# Patient Record
Sex: Female | Born: 1985 | Race: White | Hispanic: No | Marital: Married | Smoking: Never smoker
Health system: Southern US, Community
[De-identification: ages and names within clinical notes are randomized; demographics above are authoritative.]

## PROBLEM LIST (undated history)

## (undated) DIAGNOSIS — T7840XA Allergy, unspecified, initial encounter: Secondary | ICD-10-CM

## (undated) DIAGNOSIS — R87619 Unspecified abnormal cytological findings in specimens from cervix uteri: Secondary | ICD-10-CM

## (undated) DIAGNOSIS — M1990 Unspecified osteoarthritis, unspecified site: Secondary | ICD-10-CM

## (undated) DIAGNOSIS — F32A Depression, unspecified: Secondary | ICD-10-CM

## (undated) DIAGNOSIS — F329 Major depressive disorder, single episode, unspecified: Secondary | ICD-10-CM

## (undated) DIAGNOSIS — Z8619 Personal history of other infectious and parasitic diseases: Secondary | ICD-10-CM

## (undated) HISTORY — DX: Unspecified abnormal cytological findings in specimens from cervix uteri: R87.619

## (undated) HISTORY — DX: Unspecified osteoarthritis, unspecified site: M19.90

## (undated) HISTORY — DX: Allergy, unspecified, initial encounter: T78.40XA

## (undated) HISTORY — PX: HIP SURGERY: SHX245

## (undated) HISTORY — DX: Personal history of other infectious and parasitic diseases: Z86.19

## (undated) HISTORY — DX: Major depressive disorder, single episode, unspecified: F32.9

## (undated) HISTORY — DX: Depression, unspecified: F32.A

---

## 2008-08-11 LAB — HM PAP SMEAR

## 2012-08-11 DIAGNOSIS — R87619 Unspecified abnormal cytological findings in specimens from cervix uteri: Secondary | ICD-10-CM

## 2012-08-11 HISTORY — PX: CRYOTHERAPY: SHX1416

## 2012-08-11 HISTORY — DX: Unspecified abnormal cytological findings in specimens from cervix uteri: R87.619

## 2012-11-26 ENCOUNTER — Ambulatory Visit (INDEPENDENT_AMBULATORY_CARE_PROVIDER_SITE_OTHER): Payer: BC Managed Care – PPO | Admitting: Internal Medicine

## 2012-11-26 ENCOUNTER — Encounter: Payer: Self-pay | Admitting: Internal Medicine

## 2012-11-26 ENCOUNTER — Other Ambulatory Visit (INDEPENDENT_AMBULATORY_CARE_PROVIDER_SITE_OTHER): Payer: BC Managed Care – PPO

## 2012-11-26 VITALS — BP 110/68 | HR 84 | Temp 97.9°F | Ht 70.5 in | Wt 181.0 lb

## 2012-11-26 DIAGNOSIS — F3289 Other specified depressive episodes: Secondary | ICD-10-CM

## 2012-11-26 DIAGNOSIS — Z131 Encounter for screening for diabetes mellitus: Secondary | ICD-10-CM

## 2012-11-26 DIAGNOSIS — Z1329 Encounter for screening for other suspected endocrine disorder: Secondary | ICD-10-CM

## 2012-11-26 DIAGNOSIS — F329 Major depressive disorder, single episode, unspecified: Secondary | ICD-10-CM

## 2012-11-26 DIAGNOSIS — Z5189 Encounter for other specified aftercare: Secondary | ICD-10-CM

## 2012-11-26 DIAGNOSIS — S8991XD Unspecified injury of right lower leg, subsequent encounter: Secondary | ICD-10-CM

## 2012-11-26 DIAGNOSIS — Z130 Encounter for screening for diseases of the blood and blood-forming organs and certain disorders involving the immune mechanism: Secondary | ICD-10-CM

## 2012-11-26 DIAGNOSIS — Z0000 Encounter for general adult medical examination without abnormal findings: Secondary | ICD-10-CM

## 2012-11-26 DIAGNOSIS — Z13220 Encounter for screening for lipoid disorders: Secondary | ICD-10-CM

## 2012-11-26 DIAGNOSIS — F32A Depression, unspecified: Secondary | ICD-10-CM

## 2012-11-26 LAB — LIPID PANEL
Cholesterol: 147 mg/dL (ref 0–200)
HDL: 50.6 mg/dL (ref >39.00)
LDL Cholesterol: 84 mg/dL (ref 0–99)
Triglycerides: 64 mg/dL (ref 0.0–149.0)
VLDL: 12.8 mg/dL (ref 0.0–40.0)

## 2012-11-26 LAB — CBC
MCHC: 33.3 g/dL (ref 30.0–36.0)
MCV: 93.3 fl (ref 78.0–100.0)
Platelets: 253 10*3/uL (ref 150.0–400.0)
RDW: 12.8 % (ref 11.5–14.6)

## 2012-11-26 LAB — BASIC METABOLIC PANEL
CO2: 30 mEq/L (ref 19–32)
Chloride: 103 mEq/L (ref 96–112)
Creatinine, Ser: 0.7 mg/dL (ref 0.4–1.2)
Potassium: 3.7 mEq/L (ref 3.5–5.1)
Sodium: 139 mEq/L (ref 135–145)

## 2012-11-26 MED ORDER — FLUOXETINE HCL 20 MG PO CAPS: 20.0000 mg | ORAL_CAPSULE | Freq: Every day | ORAL | Status: DC

## 2012-11-26 NOTE — Progress Notes (Signed)
HPI  Pt presents to the clinic today to establish care. She has not seen a PCP in a number of years. She does have a few concerns today. 1- She is having issues with depression. She has bouts of remission. This has been an issue since she was a teenager. She has never been on meds for this but she is having trouble getting over this episode and would like to start medication. She is also looking into counseling.2-. 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 She is having trouble with her right knee currently. She would like referral to sports medicine for further evaluation.   Flu: never Tetanus: 2010 LMP: 11/12/12  Past Medical History  Diagnosis Date  . Arthritis   . Depression   . Allergy   . History of chicken pox     Current Outpatient Prescriptions  Medication Sig Dispense Refill  . glucosamine-chondroitin 500-400 MG tablet Take 1 tablet by mouth daily.      . Multiple Vitamins-Minerals (WOMENS MULTIVITAMIN PLUS) TABS Take 1 tablet by mouth daily.       No current facility-administered medications for this visit.    No Known Allergies  Family History  Problem Relation Age of Onset  . Mitral valve prolapse Father   . Mitral valve prolapse Maternal Aunt   . Hypertension Paternal Grandfather   . Mitral valve prolapse Maternal Aunt   . Breast cancer Other     strong famiy history on both sides  . Stroke Neg Hx     History   Social History  . Marital Status: Single    Spouse Name: N/A    Number of Children: N/A  . Years of Education: 16+   Occupational History  . Server/Freelance Dancer    Social History Main Topics  . Smoking status: Never Smoker   . Smokeless tobacco: Never Used  . Alcohol Use: Yes  . Drug Use: No  . Sexually Active: Yes    Birth Control/ Protection: Condom   Other Topics Concern  . Not on file   Social History Narrative   Caffeine Use-yes   Regular exercise-yes    ROS:  Constitutional: Denies fever, malaise,  fatigue, headache or abrupt weight changes.  HEENT: Denies eye pain, eye redness, ear pain, ringing in the ears, wax buildup, runny nose, nasal congestion, bloody nose, or sore throat. Respiratory: Denies difficulty breathing, shortness of breath, cough or sputum production.   Cardiovascular: Denies chest pain, chest tightness, palpitations or swelling in the hands or feet.  Gastrointestinal: Denies abdominal pain, bloating, constipation, diarrhea or blood in the stool.  GU: Denies frequency, urgency, pain with urination, blood in urine, odor or discharge. Musculoskeletal: Pt reports right knee pain. Denies decrease in range of motion, difficulty with gait, muscle pain or joint swelling.  Skin: Denies redness, rashes, lesions or ulcercations.  Neurological: Denies dizziness, difficulty with memory, difficulty with speech or problems with balance and coordination.  Psych: Pt reports depression. Denies anxiety, SI/HI.  No other specific complaints in a complete review of systems (except as listed in HPI above).  PE:  BP 110/68  Pulse 84  Temp(Src) 97.9 F (36.6 C) (Oral)  Ht 5' 10.5" (1.791 m)  Wt 181 lb (82.101 kg)  BMI 25.6 kg/m2  SpO2 98%  LMP 11/12/2012 Wt Readings from Last 3 Encounters:  11/26/12 181 lb (82.101 kg)    General: Appears ther stated age, overweight but well developed, well nourished in NAD. HEENT: Head: normal  shape and size; Eyes: sclera white, no icterus, conjunctiva pink, PERRLA and EOMs intact; Ears: Tm's gray and intact, normal light reflex; Nose: mucosa pink and moist, septum midline; Throat/Mouth: Teeth present, mucosa pink and moist, no lesions or ulcerations noted.  Neck: Normal range of motion. Neck supple, trachea midline. No massses, lumps or thyromegaly present.  Cardiovascular: Normal rate and rhythm. S1,S2 noted.  No murmur, rubs or gallops noted. No JVD or BLE edema. No carotid bruits noted. Pulmonary/Chest: Normal effort and positive vesicular breath  sounds. No respiratory distress. No wheezes, rales or ronchi noted.  Abdomen: Soft and nontender. Normal bowel sounds, no bruits noted. No distention or masses noted. Liver, spleen and kidneys non palpable. Musculoskeletal: Normal range of motion. No signs of joint swelling. No difficulty with gait.  Neurological: Alert and oriented. Cranial nerves II-XII intact. Coordination normal. +DTRs bilaterally. Psychiatric: Mood depressed and affect flat. Behavior is normal. Judgment and thought content normal.      Assessment and Plan:  Preventative health Maintenance:  Pt will schedule pap smear with me Will obtain basic screening labs today  Right knee injury:  Will refer to sports medicine Information about [facility] care given

## 2012-11-26 NOTE — Patient Instructions (Signed)
Health Maintenance, Females A healthy lifestyle and preventative care can promote health and wellness.  Maintain regular health, dental, and eye exams.  Eat a healthy diet. Foods like vegetables, fruits, whole grains, low-fat dairy products, and lean protein foods contain the nutrients you need without too many calories. Decrease your intake of foods high in solid fats, added sugars, and salt. Get information about a proper diet from your caregiver, if necessary.  Regular physical exercise is one of the most important things you can do for your health. Most adults should get at least 150 minutes of moderate-intensity exercise (any activity that increases your heart rate and causes you to sweat) each week. In addition, most adults need muscle-strengthening exercises on 2 or more days a week.   Maintain a healthy weight. The body mass index (BMI) is a screening tool to identify possible weight problems. It provides an estimate of body fat based on height and weight. Your caregiver can help determine your BMI, and can help you achieve or maintain a healthy weight. For adults 20 years and older:  A BMI below 18.5 is considered underweight.  A BMI of 18.5 to 24.9 is normal.  A BMI of 25 to 29.9 is considered overweight.  A BMI of 30 and above is considered obese.  Maintain normal blood lipids and cholesterol by exercising and minimizing your intake of saturated fat. Eat a balanced diet with plenty of fruits and vegetables. Blood tests for lipids and cholesterol should begin at age 20 and be repeated every 5 years. If your lipid or cholesterol levels are high, you are over 50, or you are a high risk for heart disease, you may need your cholesterol levels checked more frequently.Ongoing high lipid and cholesterol levels should be treated with medicines if diet and exercise are not effective.  If you smoke, find out from your caregiver how to quit. If you do not use tobacco, do not start.  If you  are pregnant, do not drink alcohol. If you are breastfeeding, be very cautious about drinking alcohol. If you are not pregnant and choose to drink alcohol, do not exceed 1 drink per day. One drink is considered to be 12 ounces (355 mL) of beer, 5 ounces (148 mL) of wine, or 1.5 ounces (44 mL) of liquor.  Avoid use of street drugs. Do not share needles with anyone. Ask for help if you need support or instructions about stopping the use of drugs.  High blood pressure causes heart disease and increases the risk of stroke. Blood pressure should be checked at least every 1 to 2 years. Ongoing high blood pressure should be treated with medicines, if weight loss and exercise are not effective.  If you are 55 to 28 years old, ask your caregiver if you should take aspirin to prevent strokes.  Diabetes screening involves taking a blood sample to check your fasting blood sugar level. This should be done once every 3 years, after age 45, if you are within normal weight and without risk factors for diabetes. Testing should be considered at a younger age or be carried out more frequently if you are overweight and have at least 1 risk factor for diabetes.  Breast cancer screening is essential preventative care for women. You should practice "breast self-awareness." This means understanding the normal appearance and feel of your breasts and may include breast self-examination. Any changes detected, no matter how small, should be reported to a caregiver. Women in their 20s and 30s should have   a clinical breast exam (CBE) by a caregiver as part of a regular health exam every 1 to 3 years. After age 40, women should have a CBE every year. Starting at age 40, women should consider having a mammogram (breast X-ray) every year. Women who have a family history of breast cancer should talk to their caregiver about genetic screening. Women at a high risk of breast cancer should talk to their caregiver about having an MRI and a  mammogram every year.  The Pap test is a screening test for cervical cancer. Women should have a Pap test starting at age 21. Between ages 21 and 29, Pap tests should be repeated every 2 years. Beginning at age 30, you should have a Pap test every 3 years as long as the past 3 Pap tests have been normal. If you had a hysterectomy for a problem that was not cancer or a condition that could lead to cancer, then you no longer need Pap tests. If you are between ages 65 and 70, and you have had normal Pap tests going back 10 years, you no longer need Pap tests. If you have had past treatment for cervical cancer or a condition that could lead to cancer, you need Pap tests and screening for cancer for at least 20 years after your treatment. If Pap tests have been discontinued, risk factors (such as a new sexual partner) need to be reassessed to determine if screening should be resumed. Some women have medical problems that increase the chance of getting cervical cancer. In these cases, your caregiver may recommend more frequent screening and Pap tests.  The human papillomavirus (HPV) test is an additional test that may be used for cervical cancer screening. The HPV test looks for the virus that can cause the cell changes on the cervix. The cells collected during the Pap test can be tested for HPV. The HPV test could be used to screen women aged 30 years and older, and should be used in women of any age who have unclear Pap test results. After the age of 30, women should have HPV testing at the same frequency as a Pap test.  Colorectal cancer can be detected and often prevented. Most routine colorectal cancer screening begins at the age of 50 and continues through age 75. However, your caregiver may recommend screening at an earlier age if you have risk factors for colon cancer. On a yearly basis, your caregiver may provide home test kits to check for hidden blood in the stool. Use of a small camera at the end of a  tube, to directly examine the colon (sigmoidoscopy or colonoscopy), can detect the earliest forms of colorectal cancer. Talk to your caregiver about this at age 50, when routine screening begins. Direct examination of the colon should be repeated every 5 to 10 years through age 75, unless early forms of pre-cancerous polyps or small growths are found.  Hepatitis C blood testing is recommended for all people born from 1945 through 1965 and any individual with known risks for hepatitis C.  Practice safe sex. Use condoms and avoid high-risk sexual practices to reduce the spread of sexually transmitted infections (STIs). Sexually active women aged 25 and younger should be checked for Chlamydia, which is a common sexually transmitted infection. Older women with new or multiple partners should also be tested for Chlamydia. Testing for other STIs is recommended if you are sexually active and at increased risk.  Osteoporosis is a disease in which the   bones lose minerals and strength with aging. This can result in serious bone fractures. The risk of osteoporosis can be identified using a bone density scan. Women ages 65 and over and women at risk for fractures or osteoporosis should discuss screening with their caregivers. Ask your caregiver whether you should be taking a calcium supplement or vitamin D to reduce the rate of osteoporosis.  Menopause can be associated with physical symptoms and risks. Hormone replacement therapy is available to decrease symptoms and risks. You should talk to your caregiver about whether hormone replacement therapy is right for you.  Use sunscreen with a sun protection factor (SPF) of 30 or greater. Apply sunscreen liberally and repeatedly throughout the day. You should seek shade when your shadow is shorter than you. Protect yourself by wearing long sleeves, pants, a wide-brimmed hat, and sunglasses year round, whenever you are outdoors.  Notify your caregiver of new moles or  changes in moles, especially if there is a change in shape or color. Also notify your caregiver if a mole is larger than the size of a pencil eraser.  Stay current with your immunizations. Document Released: 02/10/2011 Document Revised: 10/20/2011 Document Reviewed: 02/10/2011 Exit[employer] Patient Information 2013 [employer]. Breast Self-Exam A self breast exam may help you find changes or problems while they are still small. Do a breast self-exam:  Every month.  One week after your period (menstrual period).  On the first day of each month if you do not have periods anymore. Look for any:  Change in breast color, size, or shape.  Dimples in your breast.  Changes in your nipples or skin.  Dry skin on your breasts or nipples.  Watery or bloody discharge from your nipples.  Feel for:  Lumps.  Thick, hard places.  Any other changes. HOME CARE There are 3 ways to do the breast self-exam: In front of a mirror.  Lift your arms over your head and turn side to side.  Put your hands on your hips and lean down, then turn from side to side.  Bend forward and turn from side to side. In the shower.  With soapy hands, check both breasts. Then check above and below your collarbone and your armpits.  Feel above and below your collarbone down to under your breast, and from the center of your chest to the outer edge of the armpit. Check for any lumps or hard spots.  Using the tips of your middle three fingers check your whole breast by pressing your hand over your breast in a circle or in an up and down motion. Lying down.  Lie flat on your bed.  Put a small pillow under the breast you are going to check. On that same side, put your hand behind your head.  With your other hand, use the 3 middle fingers to feel the breast.  Move your fingers in a circle around the breast. Press firmly over all parts of the breast to feel for any lumps. GET HELP RIGHT AWAY IF: You find any  changes in your breasts so they can be checked. Document Released: 01/14/2008 Document Revised: 10/20/2011 Document Reviewed: 11/15/2008 Exit[employer] Patient Information 2013 [employer].  

## 2012-11-26 NOTE — Assessment & Plan Note (Signed)
Reassurance given Will start prozac today Encouraged pt to contact counselor for CBT

## 2012-11-30 ENCOUNTER — Encounter: Payer: Self-pay | Admitting: Internal Medicine

## 2012-11-30 ENCOUNTER — Other Ambulatory Visit (HOSPITAL_COMMUNITY)
Admission: RE | Admit: 2012-11-30 | Discharge: 2012-11-30 | Disposition: A | Discharge status: Home / Self Care | Payer: BC Managed Care – PPO | Source: Ambulatory Visit | Attending: Internal Medicine | Admitting: Internal Medicine

## 2012-11-30 ENCOUNTER — Ambulatory Visit (INDEPENDENT_AMBULATORY_CARE_PROVIDER_SITE_OTHER): Payer: BC Managed Care – PPO | Admitting: Internal Medicine

## 2012-11-30 DIAGNOSIS — R87810 Cervical high risk human papillomavirus (HPV) DNA test positive: Secondary | ICD-10-CM | POA: Insufficient documentation

## 2012-11-30 DIAGNOSIS — Z124 Encounter for screening for malignant neoplasm of cervix: Secondary | ICD-10-CM

## 2012-11-30 DIAGNOSIS — Z01419 Encounter for gynecological examination (general) (routine) without abnormal findings: Secondary | ICD-10-CM

## 2012-11-30 DIAGNOSIS — Z1151 Encounter for screening for human papillomavirus (HPV): Secondary | ICD-10-CM | POA: Insufficient documentation

## 2012-11-30 NOTE — Patient Instructions (Signed)

## 2012-11-30 NOTE — Progress Notes (Signed)
  Subjective:    Patient ID: Robyn Long, female    DOB: 1985/08/16, 27 y.o.   MRN: [id number]  HPI  Pt presents to the clinic today for her pap smear. She has no concerns.  Review of Systems      Past Medical History  Diagnosis Date  . Arthritis   . Depression   . Allergy   . History of chicken pox     Current Outpatient Prescriptions  Medication Sig Dispense Refill  . FLUoxetine (PROZAC) 20 MG capsule Take 1 capsule (20 mg total) by mouth daily.  30 capsule  3  . glucosamine-chondroitin 500-400 MG tablet Take 1 tablet by mouth daily.      . Multiple Vitamins-Minerals (WOMENS MULTIVITAMIN PLUS) TABS Take 1 tablet by mouth daily.       No current facility-administered medications for this visit.    No Known Allergies  Family History  Problem Relation Age of Onset  . Mitral valve prolapse Father   . Mitral valve prolapse Maternal Aunt   . Hypertension Paternal Grandfather   . Mitral valve prolapse Maternal Aunt   . Breast cancer Other     strong famiy history on both sides  . Stroke Neg Hx     History   Social History  . Marital Status: Single    Spouse Name: N/A    Number of Children: N/A  . Years of Education: 16+   Occupational History  . Server/Freelance Dancer    Social History Main Topics  . Smoking status: Never Smoker   . Smokeless tobacco: Never Used  . Alcohol Use: Yes  . Drug Use: No  . Sexually Active: Yes    Birth Control/ Protection: Condom   Other Topics Concern  . Not on file   Social History Narrative   Caffeine Use-yes   Regular exercise-yes     Constitutional: Denies fever, malaise, fatigue, headache or abrupt weight changes.  GU: Denies urgency, frequency, pain with urination, burning sensation, blood in urine, odor or discharge. Skin: Denies redness, rashes, lesions or ulcercations.    No other specific complaints in a complete review of systems (except as listed in HPI above).  Objective:   Physical  Exam  Constitutional:  Alert, oriented x 4, well developed, well nourished in no apparent distress. Skin: Skin is warm and dry.  No erythema, lesion or ulceration noted. Small 5 mm nevus noted on right breast. Cardiovascular: Normal rate and rhythm. S1,S2 noted.  No murmur, rubs or gallops noted. No JVD or BLE edema. No carotid bruits noted. Pulmonary/Chest: Normal effort and positive vesicular breath sounds. No respiratory distress. No wheezes, rales or ronchi noted.  Genitourinary: Normal female anatomy. Uterus midline, anterior and soft. No CMT or discharge noted. Adenexa  Palpable bilaterally. Breast without lumps or masses.         Assessment & Plan:   Screening for cervical cancer:  Pap performed today including breast and bimanual exam Will send off tissue for review  RTC in 1 year or sooner if needed

## 2012-12-06 ENCOUNTER — Telehealth: Payer: Self-pay

## 2012-12-06 ENCOUNTER — Other Ambulatory Visit: Payer: Self-pay | Admitting: Internal Medicine

## 2012-12-06 DIAGNOSIS — IMO0002 Reserved for concepts with insufficient information to code with codable children: Secondary | ICD-10-CM

## 2012-12-06 NOTE — Telephone Encounter (Signed)
Lm for pt to call back

## 2012-12-06 NOTE — Telephone Encounter (Signed)
Message copied by Noreene Larsson on Mon Dec 06, 2012 10:53 AM ------      Message from: Lorre Munroe      Created: Mon Dec 06, 2012  8:08 AM       Robyn Long,      Can you please call Robyn Long and let her know that her pap smear came back with abnormal cells and she was HPV positive. This is something that needs further evaluation by a gynecologist. I have put in a referral for her to have a colposcopy by gyn for further evaluation. They will call her with the appointment. If she has any questions, please let me know.      [name] ------

## 2012-12-06 NOTE — Telephone Encounter (Signed)
Message copied by Noreene Larsson on Mon Dec 06, 2012  5:14 PM ------      Message from: Lorre Munroe      Created: Mon Dec 06, 2012  8:08 AM       Georgiann Hahn,      Can you please call Mrs. Bettendorf and let her know that her pap smear came back with abnormal cells and she was HPV positive. This is something that needs further evaluation by a gynecologist. I have put in a referral for her to have a colposcopy by gyn for further evaluation. They will call her with the appointment. If she has any questions, please let me know.      [name] ------

## 2012-12-06 NOTE — Telephone Encounter (Signed)
Pt notifed.

## 2012-12-15 ENCOUNTER — Ambulatory Visit (INDEPENDENT_AMBULATORY_CARE_PROVIDER_SITE_OTHER): Payer: BC Managed Care – PPO | Admitting: Sports Medicine

## 2012-12-15 ENCOUNTER — Ambulatory Visit
Admission: RE | Admit: 2012-12-15 | Discharge: 2012-12-15 | Disposition: A | Discharge status: Home / Self Care | Payer: BC Managed Care – PPO | Source: Ambulatory Visit | Attending: Sports Medicine | Admitting: Sports Medicine

## 2012-12-15 VITALS — BP 100/70 | Ht 70.0 in | Wt 175.0 lb

## 2012-12-15 DIAGNOSIS — M25561 Pain in right knee: Secondary | ICD-10-CM

## 2012-12-15 DIAGNOSIS — M25569 Pain in unspecified knee: Secondary | ICD-10-CM

## 2012-12-15 NOTE — Progress Notes (Signed)
  Subjective:    Patient ID: Robyn Long, female    DOB: 1985-11-29, 26 y.o.   MRN: [id number]  HPI chief complaint: Right knee pain  Very pleasant 27 year old female comes in today complaining of 1 month of medial sided right knee pain. No specific injury that she can recall. 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 She describes an aching discomfort which at times were radiate up into the medial thigh. She has not noticed any swelling. He denies locking or catching of the knee. 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 She's been treated with patellar stabilizer brace is and physical therapy. She has not had any problems with the right knee in the past. No prior knee surgeries. No pain at rest. No pain in the groin.  Medical history and current medications are reviewed. No known drug allergies She does not smoke, drinks alcohol on occasional basis    Review of Systems     Objective:   Physical Exam Well-developed, well-nourished. No acute distress. Awake alert and oriented x3. Vital signs are reviewed  Right knee: Full range of motion. 3+ patellofemoral crepitus with significant tethering of the patella laterally. Positive patellar apprehension test. No effusion. There is slight tenderness to palpation over the medial femoral condyle but no significant tenderness over the medial joint line. Negative McMurray's. Mildly positive Thessaly's. Knee is stable to valgus and varus stressing. Negative anterior drawer, negative Lachmans. Negative posterior drawer. She has genu valgus bilaterally. VMO weakness bilaterally. Neurovascularly intact distally. Walking without significant limp.      Assessment & Plan:  1. Right knee pain-rule out OCD  X-rays of the right knee to include AP, lateral, tunnel view, and sunrise. She has an obvious history of  patellofemoral problems but I do not believe that is related to her current complaint. I will call her after I reviewed those x-rays. I recommended that she try some over-the-counter Aspercreme and I may have her work with Ellamae Sia for one to 2 visits. I do not want to give her a body helix compression sleeve as I am afraid that it will aggravate her patellofemoral symptoms. She will followup with me in 3 weeks.

## 2012-12-16 ENCOUNTER — Telehealth: Payer: Self-pay | Admitting: Sports Medicine

## 2012-12-16 NOTE — Telephone Encounter (Signed)
I discussed the patient's x-rays with her yesterday. She actually came back in to the office today to discuss them further with me. She has some mild degenerative changes in her knee. I would like to start with a comprehensive home exercise program with emphasis on quad and hamstring strengthening, particularly on strengthening the VMO muscle which is weak and atrophied in this patient. She will followup with me as scheduled in 3 weeks. We did discuss the possibility of formal physical therapy if she does not see some initial improvement with her home exercise program. I also explained to her that this will be a long-term process. She understands.

## 2013-01-05 ENCOUNTER — Telehealth: Payer: Self-pay | Admitting: General Practice

## 2013-01-05 NOTE — Telephone Encounter (Signed)
Patient called and stated she has an appt tomorrow at 1:45 and was referred to [place] because of her abnormal pap smear and her period is still lingering right now and she doesn't know if she needs to reschedule or not and would like a call back. Called patient no answer- left message stating if it was still heavy that she needs to reschedule but if its lighter she can still come in and be seen and to call us back if she has any other questions or concerns

## 2013-01-06 ENCOUNTER — Encounter: Payer: Self-pay | Admitting: Obstetrics & Gynecology

## 2013-01-06 ENCOUNTER — Ambulatory Visit (INDEPENDENT_AMBULATORY_CARE_PROVIDER_SITE_OTHER): Payer: BC Managed Care – PPO | Admitting: Obstetrics & Gynecology

## 2013-01-06 ENCOUNTER — Other Ambulatory Visit (HOSPITAL_COMMUNITY)
Admission: RE | Admit: 2013-01-06 | Discharge: 2013-01-06 | Disposition: A | Discharge status: Home / Self Care | Payer: BC Managed Care – PPO | Source: Ambulatory Visit | Attending: Obstetrics & Gynecology | Admitting: Obstetrics & Gynecology

## 2013-01-06 ENCOUNTER — Ambulatory Visit (INDEPENDENT_AMBULATORY_CARE_PROVIDER_SITE_OTHER): Payer: BC Managed Care – PPO | Admitting: Sports Medicine

## 2013-01-06 VITALS — BP 112/73 | HR 92 | Temp 99.5°F | Ht 70.0 in | Wt 173.8 lb

## 2013-01-06 VITALS — BP 103/70 | Ht 70.0 in | Wt 175.0 lb

## 2013-01-06 DIAGNOSIS — N871 Moderate cervical dysplasia: Secondary | ICD-10-CM | POA: Insufficient documentation

## 2013-01-06 DIAGNOSIS — M25561 Pain in right knee: Secondary | ICD-10-CM

## 2013-01-06 DIAGNOSIS — R87610 Atypical squamous cells of undetermined significance on cytologic smear of cervix (ASC-US): Secondary | ICD-10-CM

## 2013-01-06 DIAGNOSIS — M25569 Pain in unspecified knee: Secondary | ICD-10-CM

## 2013-01-06 DIAGNOSIS — Z01812 Encounter for preprocedural laboratory examination: Secondary | ICD-10-CM

## 2013-01-06 DIAGNOSIS — R87810 Cervical high risk human papillomavirus (HPV) DNA test positive: Secondary | ICD-10-CM

## 2013-01-06 NOTE — Progress Notes (Signed)
Patient ID: Robyn Long, female   DOB: [birth year], 27 y.o.   MRN: [id number] Patient given informed consent, signed copy in the chart, time out was performed.  Placed in lithotomy position. Cervix viewed with speculum and colposcope after application of acetic acid.  11/30/2012 Diagnosis ATYPICAL SQUAMOUS CELLS OF UNDETERMINED SIGNIFICANCE (ASC-US). +HR HPV Colposcopy adequate?  yes Acetowhite lesions?yes Punctation?no Mosaicism?  no Abnormal vasculature?  no Biopsies?yes ECC?no Changes consistent with HPV Patient was given post procedure instructions.  We will do a phone follow up if her results only require 1 year f/u   Charrie Mcconnon L. Harraway-Smith, M.D., [facility]

## 2013-01-06 NOTE — Patient Instructions (Signed)
Colposcopy Colposcopy is a procedure that uses a special lighted microscope (colposcope). It examines your cervix and vagina, or the area around the outside of the vagina, for signs of disease or abnormalities in the cells. You may be sent to a specialist (gynecologist) to do the colposcopy. A biopsy (tissue sample) may be collected during a colposcopy, if the caregiver finds any unusual cells. The biopsy is sent to the lab for further testing, and the results are reported back to your caregiver. A WOMAN MAY NEED THIS PROCEDURE IF:  She has had an abnormal pap smear (taking cells from the cervix for testing).  She has a sore on her cervix, and a Pap test was normal.  The Pap test suggests human papilloma virus (HPV). This virus can cause genital warts and is linked to the development of cervical cancer.  She has genital warts on the cervix, or in or around the outside of the vagina.  Her mother took the drug DES while pregnant.  She has painful intercourse.  She has vaginal bleeding, especially after sexual intercourse.  There is a need to evaluate the results of previous treatment. BEFORE THE PROCEDURE   Colposcopy is done when you are not having a menstrual period.  For 24 hours before the colposcopy, do not:  Douche.  Use tampons.  Use medicines, creams, or suppositories in the vagina.  Have sexual intercourse. PROCEDURE   A colposcopy is done while a woman is lying on her back with her feet in foot rests (stirrups).  A speculum is placed inside the vagina to keep it open and to allow the caregiver to see the cervix. This is the same instrument used to do a pap smear.  The colposcope is placed outside the vagina. It is used to magnify and examine the cervix, vagina, and the area around the outside of the vagina.  A small amount of liquid solution is placed on the area that is to be viewed. This solution is placed on with a cotton applicator. This solution makes it easier to  see the abnormal cells.  Your caregiver will suck out mucus and cells from the canal of the cervix.  Small pieces of tissue for biopsy may be taken at the same time. You may feel mild pain or discomfort when this is done.  Your caregiver will record the location of the abnormal areas and send the tissue samples to a lab for analysis.  If your caregiver biopsies the vagina or outside of the vagina, a local anesthetic (novocaine) is usually given. AFTER THE PROCEDURE   You may have some cramping that often goes away in a few minutes. You may have some soreness for a couple of days.  You may take over-the-counter pain medicine as advised by your caregiver. Do not take aspirin because it can cause bleeding.  Lie down for a few minutes if you feel lightheaded.  You may have some bleeding or dark discharge that should stop in a few days.  You may need to wear a sanitary pad for a few days. HOME CARE INSTRUCTIONS   Avoid sex, douching, and using tampons for a week or as directed.  Only take medicine as directed by your caregiver.  Continue to take birth control pills, if you are on them.  Not all test results are available during your visit. If your test results are not back during the visit, make an appointment with your caregiver to find out the results. Do not assume everything is  normal if you have not heard from your caregiver or the medical facility. It is important for you to follow up on all of your test results.  Follow your caregiver's advice regarding medicines, activity, follow-up visits, and follow-up Pap tests. SEEK MEDICAL CARE IF:   You develop a rash.  You have problems with your medicine. SEEK IMMEDIATE MEDICAL CARE IF:  You are bleeding heavily or are passing blood clots.  You develop a fever over 102 F (38.9 C), with or without chills.  You have abnormal vaginal discharge.  You are having cramps that do not go away after taking your pain medicine.  You  feel lightheaded, dizzy, or faint.  You develop stomach pain. Document Released: 10/18/2002 Document Revised: 10/20/2011 Document Reviewed: 05/31/2009 [facility] Patient Information 2014 [place], [place]. Colposcopy Care After Colposcopy is a procedure in which a special tool is used to magnify the surface of the cervix. A tissue sample (biopsy) may also be taken. This sample will be looked at for cervical cancer or other problems. After the test:  You may have some cramping.  Lie down for a few minutes if you feel lightheaded.   You may have some bleeding which should stop in a few days. HOME CARE  Do not have sex or use tampons for 2 to 3 days or as told.  Only take medicine as told by your doctor.  Continue to take your birth control pills as usual. Finding out the results of your test Ask when your test results will be ready. Make sure you get your test results. GET HELP RIGHT AWAY IF:  You are bleeding a lot or are passing blood clots.  You develop a fever of 102 F (38.9 C) or higher.  You have abnormal vaginal discharge.  You have cramps that do not go away with medicine.  You feel lightheaded, dizzy, or pass out (faint). MAKE SURE YOU:   Understand these instructions.  Will watch your condition.  Will get help right away if you are not doing well or get worse. Document Released: 01/14/2008 Document Revised: 10/20/2011 Document Reviewed: 01/14/2008 [facility] Patient Information 2014 [place], [place].

## 2013-01-06 NOTE — Progress Notes (Signed)
Robyn Long is a 27 y.o. female who presents to [facility] today for followup right knee pain. 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  She was given VMO strengthening protocol as well as hip abductor strengthening protocol. She notes that her pain has resolved. She continues her exercises. 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 No radiating pain weakness or numbness   PMH reviewed.  History  Substance Use Topics  . Smoking status: Never Smoker   . Smokeless tobacco: Never Used  . Alcohol Use: Yes   ROS as above otherwise neg   Exam:  BP 103/70  Ht 5\' 10"  (1.778 m)  Wt 175 lb (79.379 kg)  BMI 25.11 kg/m2  LMP 12/13/2012 Gen: Well NAD MSK: Right knee. Decreased VMO bulk.  No effusion or synovitis Range of motion 0-130 1+ right patellar crepitations Nontender Stable ligamentous exam  Dg Knee 4 Views W/patella Right  12/15/2012   *RADIOLOGY REPORT*  Clinical Data: The medial knee pain.  No known injury.  RIGHT KNEE - COMPLETE 4+ VIEW  Comparison: None.  Findings: There are tricompartmental degenerative changes with osteophytes, most pronounced laterally at the patellofemoral joint. The joint spaces are relatively maintained.  There is no evidence of acute fracture or loose body.  There is no significant knee joint effusion.  IMPRESSION: Tricompartmental osteophytes.  No acute osseous findings or significant joint effusion demonstrated.   Original Report Authenticated By: Carey Bullocks, M.D.

## 2013-01-06 NOTE — Assessment & Plan Note (Signed)
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

## 2013-01-10 ENCOUNTER — Other Ambulatory Visit: Payer: Self-pay | Admitting: Obstetrics & Gynecology

## 2013-01-10 LAB — POCT PREGNANCY, URINE: Preg Test, Ur: NEGATIVE

## 2013-01-12 ENCOUNTER — Telehealth: Payer: Self-pay | Admitting: Obstetrics and Gynecology

## 2013-01-12 NOTE — Telephone Encounter (Signed)
Spoke to patient and notified of colpo result. Unable to make appt for cryo yet due to July schedule not available yet. Advised patient to anticipate a  Call from front desk around next week to make CRYO APPT. Patient stated understanding. (note forwarded to admin pool) 

## 2013-01-12 NOTE — Telephone Encounter (Signed)
Spoke to patient and notified of colpo result. Unable to make appt for cryo yet due to July schedule not available yet. Advised patient to anticipate a  Call from front desk around next week to make CRYO APPT. Patient stated understanding. (note forwarded to admin pool)

## 2013-01-12 NOTE — Telephone Encounter (Addendum)
Message copied by Toula Moos on Wed Jan 12, 2013 11:45 AM   ------called patient; no answer. Left message to call us back for some important message.        Message from: Willodean Rosenthal      Created: Tue Jan 11, 2013  5:24 PM       Please call pt.  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  Please schedule f/u with cryo.            Thx,      clh-S ------

## 2013-01-20 ENCOUNTER — Encounter: Payer: Self-pay | Admitting: Family Medicine

## 2013-02-10 ENCOUNTER — Encounter: Payer: Self-pay | Admitting: Family Medicine

## 2013-02-10 ENCOUNTER — Ambulatory Visit (INDEPENDENT_AMBULATORY_CARE_PROVIDER_SITE_OTHER): Payer: BC Managed Care – PPO | Admitting: Family Medicine

## 2013-02-10 VITALS — BP 115/72 | HR 78 | Temp 97.6°F | Ht 70.0 in | Wt 175.9 lb

## 2013-02-10 DIAGNOSIS — Z01812 Encounter for preprocedural laboratory examination: Secondary | ICD-10-CM

## 2013-02-10 DIAGNOSIS — N871 Moderate cervical dysplasia: Secondary | ICD-10-CM

## 2013-02-10 NOTE — Patient Instructions (Signed)
Cryosurgery Cryosurgery is the use of extreme cold to freeze and remove abnormal or diseased tissue. Growths on the skin such as warts, pre-skin cancers (actinic keratoses), and some kinds of skin cancer may be removed with cryosurgery. LET YOUR CAREGIVER KNOW ABOUT:  Allergies to food or medicine.  Medicines taken, including vitamins, herbs, eyedrops, over-the-counter medicines, and creams.  Use of steroids (by mouth or creams).  Previous problems with anesthetics or numbing medicines.  History of bleeding problems or blood clots.  Previous surgery.  Other health problems, including diabetes and kidney problems.  Possibility of pregnancy, if this applies. RISKS AND COMPLICATIONS  Scars.  Changes in skin color.  Swelling.  Nerve damage and loss of feeling (rare). BEFORE THE PROCEDURE No preparation is necessary. PROCEDURE  There are different methods for performing cryosurgery. One method involves using a device (probe) that has liquid nitrogen flowing through it. The liquid nitrogen cools the probe. The probe is then applied to the growth until it is frozen and destroyed. Another method involves spraying liquid nitrogen directly on the growth. Cryosurgery usually takes a few minutes and can be done in your caregiver's office. AFTER THE PROCEDURE Shortly after the procedure, the treated area will become red and swollen. Within 2 to 3 days, a blister will form over the treated area. The blister may contain a small amount of blood. In about 2 weeks, the blister will break on its own, leaving a scab. The treated area will then heal. After healing, there is usually little or no scarring. You may need treatment again if the growth comes back. Document Released: 07/25/2000 Document Revised: 10/20/2011 Document Reviewed: 07/07/2011 [facility] Patient Information 2014 [place], [place].

## 2013-02-10 NOTE — Progress Notes (Signed)
  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  Physical exam Normal appearing cervix  Procedure: GYNECOLOGY CLINIC PROCEDURE NOTE  Cryotherapy details The indications for cryotherapy were reviewed with the patient in detail. She was counseled about that efficacy of this procedure, and possible need for excisional procedure in the future if her cervical dysplasia persists.  The risks of the procedure where explained in detail and patient was told to expect a copious amount of discharge in the next few weeks. All her questions were answered, and written informed consent was obtained.  The patient was placed in the dorsal lithotomy position and a vaginal speculum was placed. Her cervix was visualized and patient was noted to have had normal size transformation zone. The appropriate cryotherapy probe was picked and affixed to cryotherapy apparatus. Then nitrogen gas was then activated, the probe was coated with lubricating jelly and applied to the transformation zone of the cervix. This was kept in place for 3 minutes. The cryotherapy was then stopped and all instruments were removed from the patient's pelvis; a thawing period of 3 minutes was observed.  A second cycle of cryotherapy was then administered to the cervix for 3 minutes.  The patient tolerated the procedure well without any complications. Routine post procedure instructions were given to the patient.   Assessment CIN2  Plan F/u per ASCCP guidelines. Pap and HPV testing at 12 and 24 months.

## 2013-02-14 ENCOUNTER — Encounter: Payer: Self-pay | Admitting: *Deleted

## 2013-03-22 ENCOUNTER — Telehealth: Payer: Self-pay | Admitting: Internal Medicine

## 2013-03-22 ENCOUNTER — Encounter: Payer: Self-pay | Admitting: Internal Medicine

## 2013-03-22 ENCOUNTER — Ambulatory Visit (INDEPENDENT_AMBULATORY_CARE_PROVIDER_SITE_OTHER): Payer: BC Managed Care – PPO | Admitting: Internal Medicine

## 2013-03-22 ENCOUNTER — Ambulatory Visit (INDEPENDENT_AMBULATORY_CARE_PROVIDER_SITE_OTHER)
Admission: RE | Admit: 2013-03-22 | Discharge: 2013-03-22 | Disposition: A | Discharge status: Home / Self Care | Payer: BC Managed Care – PPO | Source: Ambulatory Visit | Attending: Internal Medicine | Admitting: Internal Medicine

## 2013-03-22 VITALS — BP 110/80 | HR 83 | Temp 98.2°F | Ht 70.0 in | Wt 173.5 lb

## 2013-03-22 DIAGNOSIS — F329 Major depressive disorder, single episode, unspecified: Secondary | ICD-10-CM

## 2013-03-22 DIAGNOSIS — F32A Depression, unspecified: Secondary | ICD-10-CM

## 2013-03-22 DIAGNOSIS — F3289 Other specified depressive episodes: Secondary | ICD-10-CM

## 2013-03-22 DIAGNOSIS — R079 Chest pain, unspecified: Secondary | ICD-10-CM

## 2013-03-22 MED ORDER — NAPROXEN 500 MG PO TABS: 500.0000 mg | ORAL_TABLET | Freq: Two times a day (BID) | ORAL | Status: DC

## 2013-03-22 NOTE — Assessment & Plan Note (Addendum)
ECG reviewed as per emr, most c/w costocondritis, for cxr to r/o pulm issue, nsaid prn,  to f/u any worsening symptoms or concerns

## 2013-03-22 NOTE — Telephone Encounter (Signed)
Pt scheduled with Dr Jonny Ruiz at 3:45pm

## 2013-03-22 NOTE — Telephone Encounter (Signed)
Pt schedulted with Dr Jonny Ruiz at 3:45

## 2013-03-22 NOTE — Telephone Encounter (Signed)
Patient Information:  Caller Name: Robyn Long  Phone: [phone]  Patient: Robyn Long  Gender: Female  DOB: 12/28/1985  Age: 27 Years  PCP: Robyn Long  Pregnant: No  Office Follow Up:  Does the office need to follow up with this patient?: Yes  Instructions For The Office: appt workin for See Today disposition; no appts available at [place], [place], [place], [place], or [place].  krs/can  RN Note:  States her pain increases when she works out.  Works out with cardio exercise, dancing, yoga, running, not necessarily lifting weights over her chest.  Per chest pain protocol, advised appt today in office due to "intermittent chest pains persist > 3 days."  Appts unavailable in office per Epic; no appts available in [place], [place], or [place].  Info to office for provider review/possible workin appt.  May reach patient at [phone].  krs/can  Symptoms  Reason For Call & Symptoms: left sided chest ache like a sore muscle  Reviewed Health History In EMR: Yes  Reviewed Medications In EMR: Yes  Reviewed Allergies In EMR: Yes  Reviewed Surgeries / Procedures: Yes  Date of Onset of Symptoms: 03/15/2013 OB / GYN:  LMP: 03/20/2013  Guideline(s) Used:  Chest Pain  Disposition Per Guideline:   See Today in Office  Reason For Disposition Reached:   Intermittent chest pains persist > 3 days  Advice Given:  N/A  Patient Will Follow Care Advice:  YES

## 2013-03-22 NOTE — Assessment & Plan Note (Signed)
stable overall by history and exam, and pt to continue medical treatment as before,  to f/u any worsening symptoms or concerns 

## 2013-03-22 NOTE — Patient Instructions (Signed)
Please take all new medication as prescribed Please continue all other medications as before Please go to the XRAY Department in the Basement (go straight as you get off the elevator) for the x-ray testing You will be contacted by phone if any changes need to be made immediately.  Otherwise, you will receive a letter about your results with an explanation, but please check with MyChart first.  Please remember to sign up for My Chart if you have not done so, as this will be important to you in the future with finding out test results, communicating by private email, and scheduling acute appointments online when needed.

## 2013-03-22 NOTE — Progress Notes (Signed)
  Subjective:    Patient ID: Robyn Long, female    DOB: [birth year], 27 y.o.   MRN: [id number]  HPI  Here with 1 wk onset mild to mod intermittent left mid parasternal pain, worse with exhalation and crossing the left arm to the right across the body, o/w Pt denies other chest pain, increased sob or doe, wheezing, orthopnea, PND, increased LE swelling, palpitations, dizziness or syncope.  Started after she and boyfriend moved the furniture. Pt denies new neurological symptoms such as new headache, or facial or extremity weakness or numbness   Pt denies polydipsia, polyuria. Denies worsening depressive symptoms, suicidal ideation, or panic  Past Medical History  Diagnosis Date  . Arthritis   . Allergy   . History of chicken pox   . Depression    No past surgical history on file.  reports that she has never smoked. She has never used smokeless tobacco. She reports that she drinks about 0.6 ounces of alcohol per week. She reports that she does not use illicit drugs. family history includes Breast cancer in her other; Hypertension in her paternal grandfather; and Mitral valve prolapse in her father and maternal aunts.  There is no history of Stroke. No Known Allergies  Review of Systems  Constitutional: Negative for unexpected weight change, or unusual diaphoresis  HENT: Negative for tinnitus.   Eyes: Negative for photophobia and visual disturbance.  Respiratory: Negative for choking and stridor.   Gastrointestinal: Negative for vomiting and blood in stool.  Genitourinary: Negative for hematuria and decreased urine volume.  Musculoskeletal: Negative for acute joint swelling Skin: Negative for color change and wound.  Neurological: Negative for tremors and numbness other than noted  Psychiatric/Behavioral: Negative for decreased concentration or  hyperactivity.       Objective:   Physical Exam BP 110/80  Pulse 83  Temp(Src) 98.2 F (36.8 C) (Oral)  Ht 5\' 10"  (1.778 m)  Wt 173 lb 8  oz (78.699 kg)  BMI 24.89 kg/m2  SpO2 98%  LMP 03/22/2013 VS noted,  Constitutional: Pt appears well-developed and well-nourished.  HENT: Head: NCAT.  Right Ear: External ear normal.  Left Ear: External ear normal.  Eyes: Conjunctivae and EOM are normal. Pupils are equal, round, and reactive to light.  Neck: Normal range of motion. Neck supple.  Cardiovascular: Normal rate and regular rhythm.   Pulmonary/Chest: Effort normal and breath sounds normal.  Abd:  Soft, NT, non-distended, + BS Neurological: Pt is alert. Not confused  Skin: Skin is warm. No erythema.  Psychiatric: Pt behavior is normal. Thought content normal. mild nervous    Assessment & Plan:

## 2013-04-04 ENCOUNTER — Other Ambulatory Visit: Payer: Self-pay | Admitting: Internal Medicine

## 2013-05-02 ENCOUNTER — Encounter: Payer: Self-pay | Admitting: Internal Medicine

## 2013-05-02 ENCOUNTER — Ambulatory Visit (INDEPENDENT_AMBULATORY_CARE_PROVIDER_SITE_OTHER): Payer: BC Managed Care – PPO | Admitting: Internal Medicine

## 2013-05-02 ENCOUNTER — Ambulatory Visit (INDEPENDENT_AMBULATORY_CARE_PROVIDER_SITE_OTHER)
Admission: RE | Admit: 2013-05-02 | Discharge: 2013-05-02 | Disposition: A | Discharge status: Home / Self Care | Payer: BC Managed Care – PPO | Source: Ambulatory Visit | Attending: Internal Medicine | Admitting: Internal Medicine

## 2013-05-02 VITALS — BP 110/78 | HR 101 | Temp 97.4°F | Wt 173.5 lb

## 2013-05-02 DIAGNOSIS — M25473 Effusion, unspecified ankle: Secondary | ICD-10-CM

## 2013-05-02 DIAGNOSIS — S8990XA Unspecified injury of unspecified lower leg, initial encounter: Secondary | ICD-10-CM

## 2013-05-02 DIAGNOSIS — S99912A Unspecified injury of left ankle, initial encounter: Secondary | ICD-10-CM

## 2013-05-02 DIAGNOSIS — M25472 Effusion, left ankle: Secondary | ICD-10-CM

## 2013-05-02 DIAGNOSIS — Z23 Encounter for immunization: Secondary | ICD-10-CM

## 2013-05-02 NOTE — Patient Instructions (Signed)
.  Ankle Pain  Ankle pain is a common symptom. The bones, cartilage, tendons, and muscles of the ankle joint perform a lot of work each day. The ankle joint holds your body weight and allows you to move around. Ankle pain can occur on either side or back of 1 or both ankles. Ankle pain may be sharp and burning or dull and aching. There may be tenderness, stiffness, redness, or warmth around the ankle. The pain occurs more often when a person walks or puts pressure on the ankle.  CAUSES   There are many reasons ankle pain can develop. It is important to work with your caregiver to identify the cause since many conditions can impact the bones, cartilage, muscles, and tendons. Causes for ankle pain include:  · Injury, including a break (fracture), sprain, or strain often due to a fall, sports, or a high-impact activity.  · Swelling (inflammation) of a tendon (tendonitis).  · Achilles tendon rupture.  · Ankle instability after repeated sprains and strains.  · Poor foot alignment.  · Pressure on a nerve (tarsal tunnel syndrome).  · Arthritis in the ankle or the lining of the ankle.  · Crystal formation in the ankle (gout or pseudogout).  DIAGNOSIS   A diagnosis is based on your medical history, your symptoms, results of your physical exam, and results of diagnostic tests. Diagnostic tests may include X-ray exams or a computerized magnetic scan (magnetic resonance imaging, MRI).  TREATMENT   Treatment will depend on the cause of your ankle pain and may include:  · Keeping pressure off the ankle and limiting activities.  · Using crutches or other walking support (a cane or brace).  · Using rest, ice, compression, and elevation.  · Participating in physical therapy or home exercises.  · Wearing shoe inserts or special shoes.  · Losing weight.  · Taking medications to reduce pain or swelling or receiving an injection.  · Undergoing surgery.  HOME CARE INSTRUCTIONS   · Only take over-the-counter or prescription medicines for  pain, discomfort, or fever as directed by your caregiver.  · Put ice on the injured area.  · Put ice in a plastic bag.  · Place a towel between your skin and the bag.  · Leave the ice on for 15-20 minutes at a time, 3-4 times a day.  · Keep your leg raised (elevated) when possible to lessen swelling.  · Avoid activities that cause ankle pain.  · Follow specific exercises as directed by your caregiver.  · Record how often you have ankle pain, the location of the pain, and what it feels like. This information may be helpful to you and your caregiver.  · Ask your caregiver about returning to work or sports and whether you should drive.  · Follow up with your caregiver for further examination, therapy, or testing as directed.  SEEK MEDICAL CARE IF:   · Pain or swelling continues or worsens beyond 1 week.  · You have an oral temperature above 102° F (38.9° C).  · You are feeling unwell or have chills.  · You are having an increasingly difficult time with walking.  · You have loss of sensation or other new symptoms.  · You have questions or concerns.  MAKE SURE YOU:   · Understand these instructions.  · Will watch your condition.  · Will get help right away if you are not doing well or get worse.  Document Released: 01/15/2010 Document Revised: 10/20/2011 Document Reviewed: 01/15/2010    [employer]® Patient Information ©2014 [employer].

## 2013-05-02 NOTE — Progress Notes (Signed)
Subjective:    Patient ID: Robyn Long, female    DOB: 04-05-1986, 27 y.o.   MRN: [id number]  HPI  Pt presents to the clinic today with c/o left ankle pain. This started yesterday. She tripped while walking and twisted her left ankle. She did hear a pop. There has been some swelling and bruising. She is able to put pressure on it but it does hurt. She has not taken anything for the pain.  Review of Systems      Past Medical History  Diagnosis Date  . Arthritis   . Allergy   . History of chicken pox   . Depression     Current Outpatient Prescriptions  Medication Sig Dispense Refill  . FLUoxetine (PROZAC) 20 MG capsule TAKE ONE CAPSULE BY MOUTH DAILY  30 capsule  5  . glucosamine-chondroitin 500-400 MG tablet Take 1 tablet by mouth daily.      . Multiple Vitamins-Minerals (WOMENS MULTIVITAMIN PLUS) TABS Take 1 tablet by mouth daily.      . naproxen (NAPROSYN) 500 MG tablet Take 1 tablet (500 mg total) by mouth 2 (two) times daily with a meal.  60 tablet  2   No current facility-administered medications for this visit.    No Known Allergies  Family History  Problem Relation Age of Onset  . Mitral valve prolapse Father   . Mitral valve prolapse Maternal Aunt   . Hypertension Paternal Grandfather   . Mitral valve prolapse Maternal Aunt   . Breast cancer Other     strong famiy history on both sides  . Stroke Neg Hx     History   Social History  . Marital Status: Single    Spouse Name: N/A    Number of Children: N/A  . Years of Education: 16+   Occupational History  . Server/Freelance Dancer    Social History Main Topics  . Smoking status: Never Smoker   . Smokeless tobacco: Never Used  . Alcohol Use: 0.6 oz/week    1 Glasses of wine per week     Comment: 4 days a week  . Drug Use: No  . Sexual Activity: Yes    Birth Control/ Protection: Condom   Other Topics Concern  . Not on file   Social History Narrative   Caffeine Use-yes   Regular exercise-yes      Constitutional: Denies fever, malaise, fatigue, headache or abrupt weight changes.  Musculoskeletal: Pt reports ankle pain. Denies decrease in range of motion, difficulty with gait, muscle pain or joint swelling.  Skin: Pt reports bruising of left ankle. Denies redness, rashes, lesions or ulcercations.  Neurological: Denies dizziness, difficulty with memory, difficulty with speech or problems with balance and coordination.   No other specific complaints in a complete review of systems (except as listed in HPI above).  Objective:   Physical Exam   BP 110/78  Pulse 101  Temp(Src) 97.4 F (36.3 C) (Oral)  Wt 173 lb 8 oz (78.699 kg)  BMI 24.89 kg/m2  SpO2 98% Wt Readings from Last 3 Encounters:  05/02/13 173 lb 8 oz (78.699 kg)  03/22/13 173 lb 8 oz (78.699 kg)  02/10/13 175 lb 14.4 oz (79.788 kg)    General: Appears her stated age, well developed, well nourished in NAD. Skin: Warm, dry and intact. No rashes, lesions or ulcerations noted. Bruising of left ankle noted. Cardiovascular: Normal rate and rhythm. S1,S2 noted.  No murmur, rubs or gallops noted. No JVD or BLE edema. No carotid bruits  noted. Pulmonary/Chest: Normal effort and positive vesicular breath sounds. No respiratory distress. No wheezes, rales or ronchi noted.  Musculoskeletal: Normal range of motion. 1+ swelling of left ankle. Mild difficulty with gait.     BMET    Component Value Date/Time   NA 139 11/26/2012 1101   K 3.7 11/26/2012 1101   CL 103 11/26/2012 1101   CO2 30 11/26/2012 1101   GLUCOSE 84 11/26/2012 1101   BUN 14 11/26/2012 1101   CREATININE 0.7 11/26/2012 1101   CALCIUM 9.4 11/26/2012 1101    Lipid Panel     Component Value Date/Time   CHOL 147 11/26/2012 1101   TRIG 64.0 11/26/2012 1101   HDL 50.60 11/26/2012 1101   CHOLHDL 3 11/26/2012 1101   VLDL 12.8 11/26/2012 1101   LDLCALC 84 11/26/2012 1101    CBC    Component Value Date/Time   WBC 4.3* 11/26/2012 1101   RBC 4.34 11/26/2012 1101    HGB 13.5 11/26/2012 1101   HCT 40.5 11/26/2012 1101   PLT 253.0 11/26/2012 1101   MCV 93.3 11/26/2012 1101   MCHC 33.3 11/26/2012 1101   RDW 12.8 11/26/2012 1101    Hgb A1C Lab Results  Component Value Date   HGBA1C 5.4 11/26/2012        Assessment & Plan:   Ankle pain, bruising and swelling, likely secondary to sprain:  Will check xray to r/o fracture Instructions given for RICE Try to stay off it for the next couple of days RTC As needed or if symptoms persist or worsen

## 2013-06-30 ENCOUNTER — Emergency Department (HOSPITAL_COMMUNITY)
Admission: EM | Admit: 2013-06-30 | Discharge: 2013-06-30 | Disposition: A | Discharge status: Home / Self Care | Payer: BC Managed Care – PPO | Attending: Emergency Medicine | Admitting: Emergency Medicine

## 2013-06-30 ENCOUNTER — Encounter (HOSPITAL_COMMUNITY): Payer: Self-pay | Admitting: Emergency Medicine

## 2013-06-30 DIAGNOSIS — N12 Tubulo-interstitial nephritis, not specified as acute or chronic: Secondary | ICD-10-CM

## 2013-06-30 DIAGNOSIS — F3289 Other specified depressive episodes: Secondary | ICD-10-CM | POA: Insufficient documentation

## 2013-06-30 DIAGNOSIS — Z8739 Personal history of other diseases of the musculoskeletal system and connective tissue: Secondary | ICD-10-CM | POA: Insufficient documentation

## 2013-06-30 DIAGNOSIS — F329 Major depressive disorder, single episode, unspecified: Secondary | ICD-10-CM | POA: Insufficient documentation

## 2013-06-30 DIAGNOSIS — Z3202 Encounter for pregnancy test, result negative: Secondary | ICD-10-CM | POA: Insufficient documentation

## 2013-06-30 DIAGNOSIS — Z8619 Personal history of other infectious and parasitic diseases: Secondary | ICD-10-CM | POA: Insufficient documentation

## 2013-06-30 DIAGNOSIS — Z79899 Other long term (current) drug therapy: Secondary | ICD-10-CM | POA: Insufficient documentation

## 2013-06-30 LAB — CBC WITH DIFFERENTIAL/PLATELET
Basophils Absolute: 0 10*3/uL (ref 0.0–0.1)
Basophils Relative: 0 % (ref 0–1)
Eosinophils Relative: 0 % (ref 0–5)
Hemoglobin: 12.3 g/dL (ref 12.0–15.0)
Lymphocytes Relative: 4 % — ABNORMAL LOW (ref 12–46)
MCHC: 34.5 g/dL (ref 30.0–36.0)
MCV: 91.5 fL (ref 78.0–100.0)
Monocytes Absolute: 0.8 10*3/uL (ref 0.1–1.0)
Monocytes Relative: 7 % (ref 3–12)
Neutro Abs: 10.4 10*3/uL — ABNORMAL HIGH (ref 1.7–7.7)
Neutrophils Relative %: 89 % — ABNORMAL HIGH (ref 43–77)
RBC: 3.9 MIL/uL (ref 3.87–5.11)
RDW: 12.7 % (ref 11.5–15.5)
WBC: 11.7 10*3/uL — ABNORMAL HIGH (ref 4.0–10.5)

## 2013-06-30 LAB — URINE MICROSCOPIC-ADD ON

## 2013-06-30 LAB — URINALYSIS, ROUTINE W REFLEX MICROSCOPIC
Bilirubin Urine: NEGATIVE
Ketones, ur: NEGATIVE mg/dL
Nitrite: POSITIVE — AB
Specific Gravity, Urine: 1.022 (ref 1.005–1.030)
Urobilinogen, UA: 0.2 mg/dL (ref 0.0–1.0)

## 2013-06-30 LAB — BASIC METABOLIC PANEL
CO2: 19 mEq/L (ref 19–32)
Chloride: 101 mEq/L (ref 96–112)
Creatinine, Ser: 0.74 mg/dL (ref 0.50–1.10)
GFR calc Af Amer: >90 mL/min (ref >90)
Glucose, Bld: 105 mg/dL — ABNORMAL HIGH (ref 70–99)
Potassium: 3.2 mEq/L — ABNORMAL LOW (ref 3.5–5.1)

## 2013-06-30 LAB — PREGNANCY, URINE: Preg Test, Ur: NEGATIVE

## 2013-06-30 MED ORDER — MORPHINE SULFATE 4 MG/ML IJ SOLN
4.0000 mg | Freq: Once | INTRAMUSCULAR | Status: AC
  Administered 2013-06-30: 4 mg via INTRAVENOUS
  Filled 2013-06-30: qty 1

## 2013-06-30 MED ORDER — CIPROFLOXACIN HCL 500 MG PO TABS: 500.0000 mg | ORAL_TABLET | Freq: Two times a day (BID) | ORAL | Status: DC

## 2013-06-30 MED ORDER — ONDANSETRON 4 MG PO TBDP: 4.0000 mg | ORAL_TABLET | Freq: Three times a day (TID) | ORAL | Status: DC | PRN

## 2013-06-30 MED ORDER — SODIUM CHLORIDE 0.9 % IV BOLUS (SEPSIS)
1000.0000 mL | Freq: Once | INTRAVENOUS | Status: AC
  Administered 2013-06-30: 1000 mL via INTRAVENOUS

## 2013-06-30 MED ORDER — ONDANSETRON HCL 4 MG/2ML IJ SOLN
4.0000 mg | Freq: Once | INTRAMUSCULAR | Status: AC
  Administered 2013-06-30: 4 mg via INTRAVENOUS
  Filled 2013-06-30: qty 2

## 2013-06-30 MED ORDER — POTASSIUM CHLORIDE CRYS ER 20 MEQ PO TBCR
30.0000 meq | EXTENDED_RELEASE_TABLET | Freq: Once | ORAL | Status: AC
  Administered 2013-06-30: 30 meq via ORAL
  Filled 2013-06-30: qty 1.5

## 2013-06-30 MED ORDER — DEXTROSE 5 % IV SOLN
1.0000 g | Freq: Once | INTRAVENOUS | Status: AC
  Administered 2013-06-30: 1 g via INTRAVENOUS
  Filled 2013-06-30: qty 10

## 2013-06-30 MED ORDER — HYDROCODONE-ACETAMINOPHEN 5-325 MG PO TABS
2.0000 | ORAL_TABLET | Freq: Four times a day (QID) | ORAL | Status: DC | PRN
Start: 2013-06-30 — End: 2013-10-28

## 2013-06-30 MED ORDER — ACETAMINOPHEN 325 MG PO TABS
650.0000 mg | ORAL_TABLET | Freq: Once | ORAL | Status: AC
  Administered 2013-06-30: 650 mg via ORAL
  Filled 2013-06-30: qty 2

## 2013-06-30 NOTE — ED Notes (Signed)
PIV site placed and labs drawn, sent Patient medicated, see MAR Patient and pt's husband updated as to [facility] and v/u Side rails up, call bell in reach

## 2013-06-30 NOTE — ED Provider Notes (Signed)
CSN: [id number]     Arrival date & time 06/30/13  1910 History   First MD Initiated Contact with Patient 06/30/13 2010     Chief Complaint  Patient presents with  . Back Pain   (Consider location/radiation/quality/duration/timing/severity/associated sxs/prior Treatment) HPI Comments: Patient presents today with a chief complaint of right flank pain.  She reports that the pain has been present since yesterday and has been constant.  Pain does not radiate.  She also reports that she has had increased urinary urgency and frequency for the past 3 days.  She denies dysuria.  She reports that today she began running a fever.  She did not check her temperature prior to arrival.  She reports some associated nausea, but denies vomiting or diarrhea.  LMP was 06/29/13.  She is currently having her period. She has not taken anything for her symptoms prior to arrival.   Patient is a 27 y.o. female presenting with back pain. The history is provided by the patient.  Back Pain Associated symptoms: fever   Associated symptoms: no dysuria     Past Medical History  Diagnosis Date  . Arthritis   . Allergy   . History of chicken pox   . Depression    History reviewed. No pertinent past surgical history. Family History  Problem Relation Age of Onset  . Mitral valve prolapse Father   . Mitral valve prolapse Maternal Aunt   . Hypertension Paternal Grandfather   . Mitral valve prolapse Maternal Aunt   . Breast cancer Other     strong famiy history on both sides  . Stroke Neg Hx    History  Substance Use Topics  . Smoking status: Never Smoker   . Smokeless tobacco: Never Used  . Alcohol Use: 0.6 oz/week    1 Glasses of wine per week     Comment: 4 days a week   OB History   Grav Para Term Preterm Abortions TAB SAB Ect Mult Living   0 0 0 0 0 0 0 0 0 0      Review of Systems  Constitutional: Positive for fever and chills.  Gastrointestinal: Positive for nausea.  Genitourinary: Positive for  urgency, frequency and flank pain. Negative for dysuria.  Musculoskeletal: Positive for back pain.  All other systems reviewed and are negative.    Allergies  Review of patient's allergies indicates no known allergies.  Home Medications   Current Outpatient Rx  Name  Route  Sig  Dispense  Refill  . FLUoxetine (PROZAC) 20 MG capsule      TAKE ONE CAPSULE BY MOUTH DAILY   30 capsule   5   . glucosamine-chondroitin 500-400 MG tablet   Oral   Take 1 tablet by mouth daily.         . Multiple Vitamins-Minerals (WOMENS MULTIVITAMIN PLUS) TABS   Oral   Take 1 tablet by mouth daily.          BP 118/54  Pulse 96  Temp(Src) 99.9 F (37.7 C) (Oral)  Resp 18  SpO2 100%  LMP 06/30/2013 Physical Exam  Nursing note and vitals reviewed. Constitutional: She appears well-developed and well-nourished.  HENT:  Head: Normocephalic and atraumatic.  Mouth/Throat: Oropharynx is clear and moist.  Neck: Normal range of motion. Neck supple.  Cardiovascular: Normal rate, regular rhythm and normal heart sounds.   Pulmonary/Chest: Effort normal and breath sounds normal.  Abdominal: Soft. Bowel sounds are normal. She exhibits no distension and no mass. There is no tenderness.  There is CVA tenderness. There is no rebound and no guarding.  Right CVA tenderness  Musculoskeletal: Normal range of motion.  Neurological: She is alert.  Skin: Skin is warm and dry.  Psychiatric: She has a normal mood and affect.    ED Course  Procedures (including critical care time) Labs Review Labs Reviewed  URINALYSIS, ROUTINE W REFLEX MICROSCOPIC - Abnormal; Notable for the following:    APPearance CLOUDY (*)    Hgb urine dipstick SMALL (*)    Nitrite POSITIVE (*)    Leukocytes, UA MODERATE (*)    All other components within normal limits  URINE MICROSCOPIC-ADD ON - Abnormal; Notable for the following:    Bacteria, UA MANY (*)    All other components within normal limits  URINE CULTURE  PREGNANCY,  URINE  CBC WITH DIFFERENTIAL  BASIC METABOLIC PANEL   Imaging Review No results found.  EKG Interpretation   None       MDM  No diagnosis found. Patient presenting with right flank pain, nausea, increased urinary frequency, and urinary urgency.  UA showing UTI.  Patient febrile in the ED.  Signs and symptoms most consistent with Pyelonephritis.  Patient given IV Rocephin in the ED.  Nausea improved in the ED.  Patient able to tolerate PO liquids.  Feel that the patient is stable for discharge.  Urine cultured.  Patient discharged home on Cipro.  Return precautions given.    Santiago Glad, PA-C 06/30/13 2216

## 2013-06-30 NOTE — ED Provider Notes (Signed)
Medical screening examination/treatment/procedure(s) were performed by non-physician practitioner and as supervising physician I was immediately available for consultation/collaboration.  Flint Melter, MD 06/30/13 [phone]

## 2013-06-30 NOTE — ED Notes (Signed)
Pt states she had a sore lower back yesterday. States today she started having shooting pains in her lower back that radiated up her back. States she is on her period and had increased urgency to urinate, but that went away. Now pt states she has weakness to her limbs and is cold and shaking now. Pt ambulatory to triage with steady gait. Pt tearful.

## 2013-06-30 NOTE — ED Notes (Signed)
Patient reports that pain and nausea medications were effective Patient informed of order for PO K+--agrees and v/u PO K+ not in [facility]--message sent to Pharmacy

## 2013-07-02 LAB — URINE CULTURE: Colony Count: >=100000

## 2013-07-03 ENCOUNTER — Telehealth (HOSPITAL_COMMUNITY): Payer: Self-pay | Admitting: Emergency Medicine

## 2013-07-03 NOTE — ED Notes (Signed)
Post ED Visit - Positive Culture Follow-up  Culture report reviewed by antimicrobial stewardship pharmacist: []  Wes Dulaney, Pharm.D., BCPS [x]  Celedonio Miyamoto, Pharm.D., BCPS []  Georgina Pillion, Pharm.D., BCPS []  [place], [address].D., BCPS, AAHIVP []  Estella Husk, Pharm.D., BCPS, AAHIVP  Positive urine culture Treated with Cipro, organism sensitive to the same and no further patient follow-up is required at this time.  Kylie A Holland 07/03/2013, 6:24 PM

## 2013-07-12 ENCOUNTER — Ambulatory Visit (INDEPENDENT_AMBULATORY_CARE_PROVIDER_SITE_OTHER): Payer: BC Managed Care – PPO | Admitting: Internal Medicine

## 2013-07-12 ENCOUNTER — Encounter: Payer: Self-pay | Admitting: Internal Medicine

## 2013-07-12 VITALS — BP 102/60 | HR 87 | Temp 98.5°F | Ht 70.0 in | Wt 173.0 lb

## 2013-07-12 DIAGNOSIS — N10 Acute tubulo-interstitial nephritis: Secondary | ICD-10-CM

## 2013-07-12 MED ORDER — CIPROFLOXACIN HCL 500 MG PO TABS: 500.0000 mg | ORAL_TABLET | Freq: Two times a day (BID) | ORAL | Status: DC

## 2013-07-12 NOTE — Progress Notes (Signed)
   Subjective:    Patient ID: Rosilyn Coachman, female    DOB: December 04, 1985, 27 y.o.   MRN: [id number]  HPI  Here after tx for right pyelonephritis/uti with 7 days cipro, much improved without fever or flank pain, but lower abd pain and dysuria persist.  Is cx proven e coli, pansensitive.  No n/v or blood. And Denies urinary symptoms such as frequency, urgency, flank pain, hematuria or n/v, fever, chills.  Past Medical History  Diagnosis Date  . Arthritis   . Allergy   . History of chicken pox   . Depression    No past surgical history on file.  reports that she has never smoked. She has never used smokeless tobacco. She reports that she drinks about 0.6 ounces of alcohol per week. She reports that she does not use illicit drugs. family history includes Breast cancer in her other; Hypertension in her paternal grandfather; Mitral valve prolapse in her father, maternal aunt, and maternal aunt. There is no history of Stroke. No Known Allergies Current Outpatient Prescriptions on File Prior to Visit  Medication Sig Dispense Refill  . FLUoxetine (PROZAC) 20 MG capsule TAKE ONE CAPSULE BY MOUTH DAILY  30 capsule  5  . glucosamine-chondroitin 500-400 MG tablet Take 1 tablet by mouth daily.      Marland Kitchen HYDROcodone-acetaminophen (NORCO/VICODIN) 5-325 MG per tablet Take 2 tablets by mouth every 6 (six) hours as needed.  10 tablet  0  . Multiple Vitamins-Minerals (WOMENS MULTIVITAMIN PLUS) TABS Take 1 tablet by mouth daily.      . ondansetron (ZOFRAN ODT) 4 MG disintegrating tablet Take 1 tablet (4 mg total) by mouth every 8 (eight) hours as needed for nausea or vomiting.  20 tablet  0   No current facility-administered medications on file prior to visit.   Review of Systems All otherwise neg per pt     Objective:   Physical Exam BP 102/60  Pulse 87  Temp(Src) 98.5 F (36.9 C) (Oral)  Ht 5\' 10"  (1.778 m)  Wt 173 lb (78.472 kg)  BMI 24.82 kg/m2  SpO2 96%  LMP 06/30/2013 VS noted,    Constitutional: Pt appears well-developed and well-nourished.  HENT: Head: NCAT.  Right Ear: External ear normal.  Left Ear: External ear normal.  Eyes: Conjunctivae and EOM are normal. Pupils are equal, round, and reactive to light.  Neck: Normal range of motion. Neck supple.  Cardiovascular: Normal rate and regular rhythm.   Pulmonary/Chest: Effort normal and breath sounds normal.  Abd:  Soft, non-distended, + BS but mild to mod persistent low mid abd tender, no flank tender Neurological pt is alert. Not confused  Skin: Skin is warm. No erythema.  Psychiatric: Pt behavior is normal. Thought content normal. not depressed affect    Assessment & Plan:

## 2013-07-12 NOTE — Progress Notes (Signed)
Pre-visit discussion using our clinic review tool. No additional management support is needed unless otherwise documented below in the visit note.  

## 2013-07-12 NOTE — Assessment & Plan Note (Signed)
Suspect pt with suboptimal tx for cx proven Gu tract infxn, for repeat cipro course, then cx if any residiual symtpoms

## 2013-07-12 NOTE — Patient Instructions (Addendum)
Please take all new medication as prescribed - the repeat cipro course x 7 days Please continue all other medications as before, and refills have been done if requested.  Please remember to sign up for My Chart if you have not done so, as this will be important to you in the future with finding out test results, communicating by private email, and scheduling acute appointments online when needed.

## 2013-10-12 ENCOUNTER — Ambulatory Visit (INDEPENDENT_AMBULATORY_CARE_PROVIDER_SITE_OTHER): Payer: BC Managed Care – PPO | Admitting: Psychology

## 2013-10-12 DIAGNOSIS — F331 Major depressive disorder, recurrent, moderate: Secondary | ICD-10-CM

## 2013-10-21 ENCOUNTER — Ambulatory Visit (INDEPENDENT_AMBULATORY_CARE_PROVIDER_SITE_OTHER): Payer: BC Managed Care – PPO | Admitting: Psychology

## 2013-10-21 DIAGNOSIS — F331 Major depressive disorder, recurrent, moderate: Secondary | ICD-10-CM

## 2013-10-28 ENCOUNTER — Encounter: Payer: Self-pay | Admitting: Internal Medicine

## 2013-10-28 ENCOUNTER — Ambulatory Visit (INDEPENDENT_AMBULATORY_CARE_PROVIDER_SITE_OTHER): Payer: BC Managed Care – PPO | Admitting: Psychology

## 2013-10-28 ENCOUNTER — Ambulatory Visit (INDEPENDENT_AMBULATORY_CARE_PROVIDER_SITE_OTHER): Payer: BC Managed Care – PPO | Admitting: Internal Medicine

## 2013-10-28 VITALS — BP 106/70 | HR 119 | Temp 98.5°F | Ht 70.0 in | Wt 182.4 lb

## 2013-10-28 DIAGNOSIS — F3289 Other specified depressive episodes: Secondary | ICD-10-CM

## 2013-10-28 DIAGNOSIS — F32A Depression, unspecified: Secondary | ICD-10-CM

## 2013-10-28 DIAGNOSIS — F329 Major depressive disorder, single episode, unspecified: Secondary | ICD-10-CM

## 2013-10-28 DIAGNOSIS — F331 Major depressive disorder, recurrent, moderate: Secondary | ICD-10-CM

## 2013-10-28 MED ORDER — FLUOXETINE HCL 40 MG PO CAPS: 40.0000 mg | ORAL_CAPSULE | Freq: Every day | ORAL | Status: DC

## 2013-10-28 NOTE — Progress Notes (Signed)
   Subjective:    Patient ID: Robyn Long, female    DOB: 01/05/1986, 28 y.o.   MRN: [id number]  HPI  Here to f/u, c/o gradually worsening 3 mo depression with low mood, anhedonia, low energy, sleeping difficulty but no SI or HI or significant worsening anxiety or panic; despite taking prozac 20 with good compliance.  Has been on since April 2014 with initial good response in the first month about 70%.  Has hx of remote depression as well as teen.  No overt stressors to account for this, as personal and work realtionships and function ok. Past Medical History  Diagnosis Date  . Arthritis   . Allergy   . History of chicken pox   . Depression    No past surgical history on file.  reports that she has never smoked. She has never used smokeless tobacco. She reports that she drinks about 0.6 ounces of alcohol per week. She reports that she does not use illicit drugs. family history includes Breast cancer in her other; Hypertension in her paternal grandfather; Mitral valve prolapse in her father, maternal aunt, and maternal aunt. There is no history of Stroke. No Known Allergies Current Outpatient Prescriptions on File Prior to Visit  Medication Sig Dispense Refill  . glucosamine-chondroitin 500-400 MG tablet Take 1 tablet by mouth daily.      . Multiple Vitamins-Minerals (WOMENS MULTIVITAMIN PLUS) TABS Take 1 tablet by mouth daily.       No current facility-administered medications on file prior to visit.   Review of Systems All otherwise neg per pt     Objective:   Physical Exam BP 106/70  Pulse 119  Temp(Src) 98.5 F (36.9 C) (Oral)  Ht 5\' 10"  (1.778 m)  Wt 182 lb 6 oz (82.725 kg)  BMI 26.17 kg/m2  SpO2 97% VS noted,  Constitutional: Pt appears well-developed and well-nourished.  HENT: Head: NCAT.  Right Ear: External ear normal.  Left Ear: External ear normal.  Eyes: Conjunctivae and EOM are normal. Pupils are equal, round, and reactive to light.  Neck: Normal range of  motion. Neck supple.  Cardiovascular: Normal rate and regular rhythm.   Pulmonary/Chest: Effort normal and breath sounds normal.  Neurological: Pt is alert. Not confused  Skin: Skin is warm. No erythema.  Psychiatric: Pt behavior is normal. Thought content normal.  + depressed affect, tearful    Assessment & Plan:

## 2013-10-28 NOTE — Patient Instructions (Signed)
OK to increase the prozac to 40 mg per day  Please continue all other medications as before, and refills have been done if requested. Please have the pharmacy call with any other refills you may need.  Please call in 4 wks if not improved, to consider change to lexapro  Please continue counseling as you have been doing  Please return in 1 year for your yearly visit, or sooner if needed

## 2013-10-29 NOTE — Assessment & Plan Note (Addendum)
With initial good response to prozac , waning in past month, without specific exogenous stressors, ok to increase prozac to 40 mg, consider change to lexapro if not improved in 2-4 wks, verified nonsuicidal ideation, declines referral psychiatry or counseling

## 2013-11-10 IMAGING — CR DG KNEE COMPLETE 4+V*R*
1 series · 1 of 1 positions shown · non-contrast
Comparison: None.

CLINICAL DATA: The medial knee pain.  No known injury.

RIGHT KNEE - COMPLETE 4+ VIEW

[view not recorded]
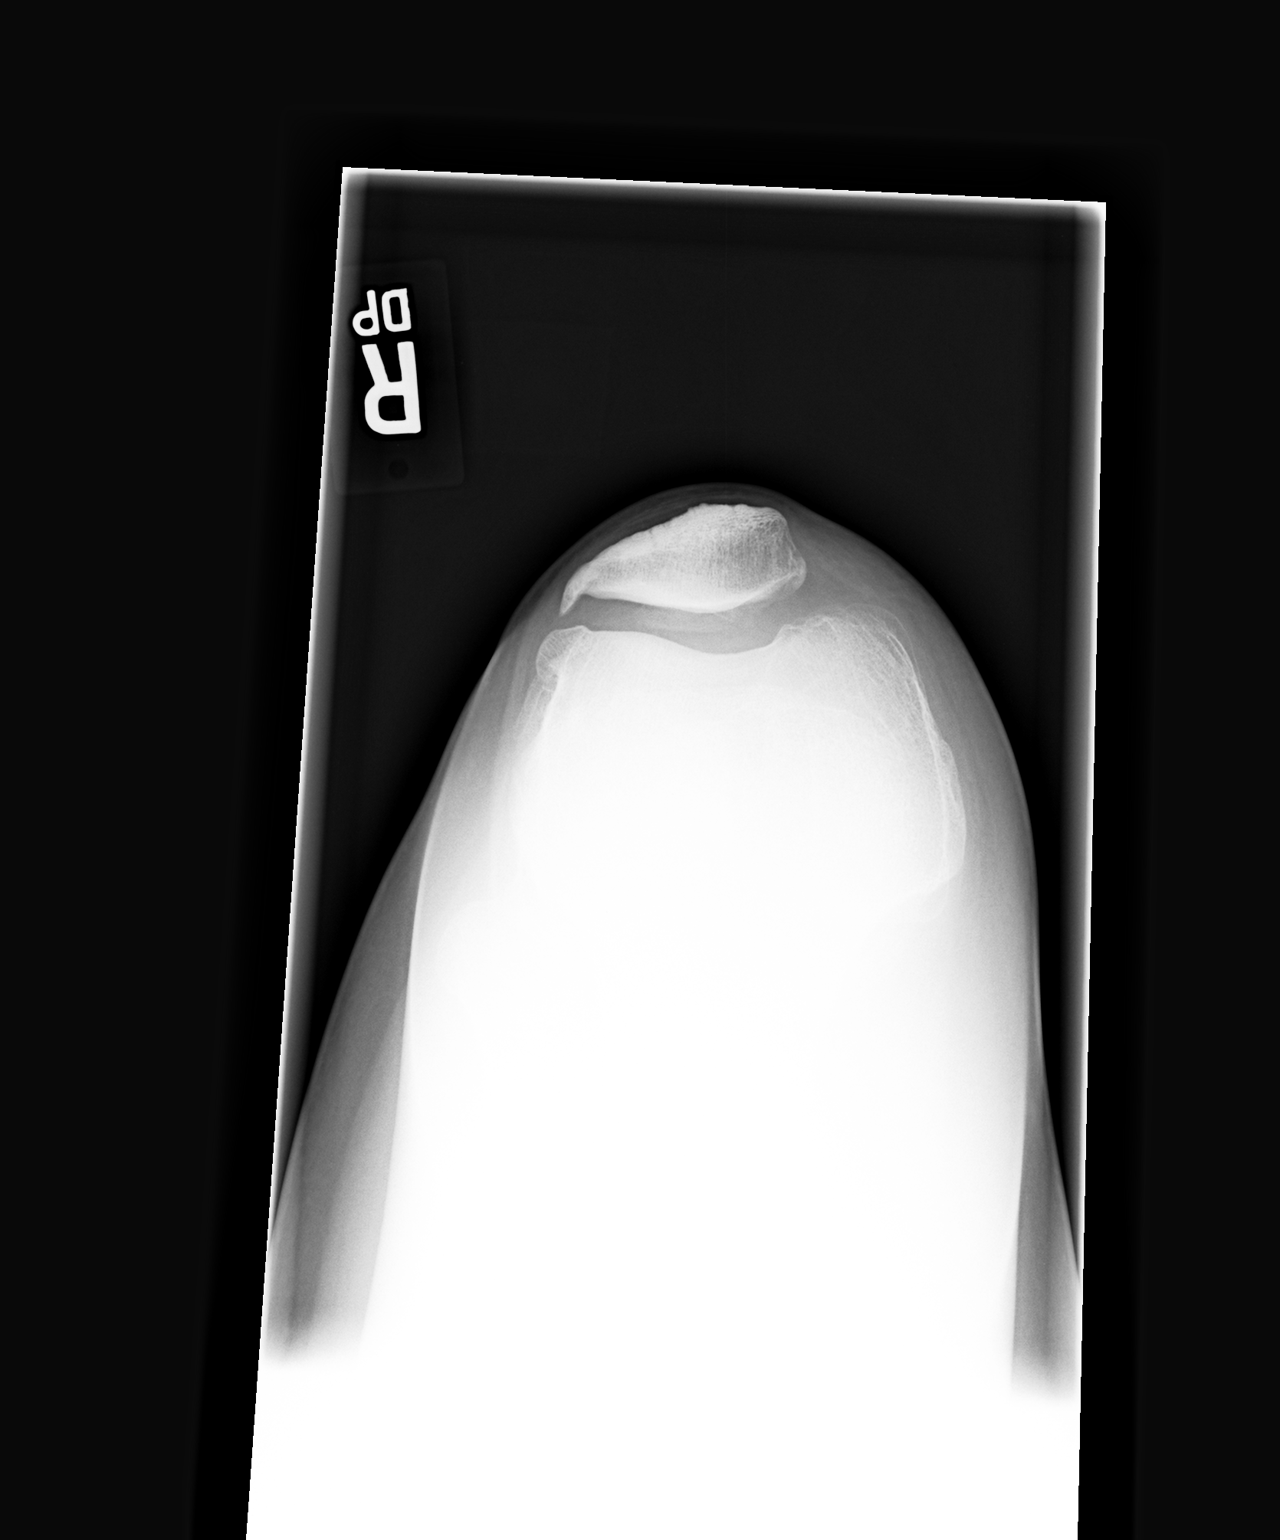

[1 of 1 positions shown; findings below may reference images not displayed]

FINDINGS: There are tricompartmental degenerative changes with
osteophytes, most pronounced laterally at the patellofemoral joint.
The joint spaces are relatively maintained.  There is no evidence
of acute fracture or loose body.  There is no significant knee
joint effusion.
IMPRESSION: Tricompartmental osteophytes.  No acute osseous findings or
significant joint effusion demonstrated.

## 2013-11-14 ENCOUNTER — Ambulatory Visit (INDEPENDENT_AMBULATORY_CARE_PROVIDER_SITE_OTHER): Payer: BC Managed Care – PPO | Admitting: Psychology

## 2013-11-14 DIAGNOSIS — F331 Major depressive disorder, recurrent, moderate: Secondary | ICD-10-CM

## 2013-11-21 ENCOUNTER — Ambulatory Visit (INDEPENDENT_AMBULATORY_CARE_PROVIDER_SITE_OTHER): Payer: BC Managed Care – PPO | Admitting: Psychology

## 2013-11-21 DIAGNOSIS — F331 Major depressive disorder, recurrent, moderate: Secondary | ICD-10-CM

## 2013-12-05 ENCOUNTER — Ambulatory Visit (INDEPENDENT_AMBULATORY_CARE_PROVIDER_SITE_OTHER): Payer: BC Managed Care – PPO | Admitting: Psychology

## 2013-12-05 DIAGNOSIS — F331 Major depressive disorder, recurrent, moderate: Secondary | ICD-10-CM

## 2013-12-13 ENCOUNTER — Ambulatory Visit: Payer: BC Managed Care – PPO | Admitting: Psychology

## 2013-12-19 ENCOUNTER — Ambulatory Visit (INDEPENDENT_AMBULATORY_CARE_PROVIDER_SITE_OTHER): Payer: BC Managed Care – PPO | Admitting: Psychology

## 2013-12-19 DIAGNOSIS — F331 Major depressive disorder, recurrent, moderate: Secondary | ICD-10-CM

## 2013-12-28 ENCOUNTER — Ambulatory Visit (INDEPENDENT_AMBULATORY_CARE_PROVIDER_SITE_OTHER): Payer: BC Managed Care – PPO | Admitting: Sports Medicine

## 2013-12-28 ENCOUNTER — Encounter: Payer: Self-pay | Admitting: Sports Medicine

## 2013-12-28 VITALS — BP 116/73 | Ht 71.0 in | Wt 175.0 lb

## 2013-12-28 DIAGNOSIS — M25519 Pain in unspecified shoulder: Secondary | ICD-10-CM

## 2013-12-28 MED ORDER — MELOXICAM 15 MG PO TABS: ORAL_TABLET | ORAL | Status: DC

## 2013-12-28 NOTE — Progress Notes (Signed)
   Subjective:    Patient ID: Robyn Long, female    DOB: 12/26/1985, 28 y.o.   MRN: [id number]  HPI chief complaint: Right shoulder pain  28 year old right-hand-dominant female comes in today complaining of 2 months of intermittent right periscapular pain. 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 During the recent furniture market she began to notice she was getting discomfort around the periscapular area with activity. This was initially associated with some numbness and tingling down the right arm but that has since resolved since the furniture market ended. Over the past 2 weeks she has experienced increasing discomfort however in the area of her shoulder blade. Worse with certain activities such as scapular retraction. She has not noticed any weakness. She denies any neck pain. She denies any deep-seated shoulder pain. She has taken an occasional ibuprofen for her pain which has been only minimally helpful. She denies any similar issues with his shoulder in the past. No trauma.  Interim medical history reviewed Current medications reviewed No known drug allergies    Review of Systems     Objective:   Physical Exam Well-developed, well-nourished. No acute distress. Awake alert and oriented x3. Vital signs reviewed.  Right shoulder: Full painless range of motion. No tenderness over the clavicle or a.c. joint. Rotator cuff strength 5/5. Negative he can, negative Hawkins. There is tenderness to palpation diffusely around the medial border of the scapula. However, no obvious scapular winging. No scapular dyskinesis.  Neurological exam shows good strength in both upper extremities. Reflexes are brisk and equal at the biceps, triceps, and brachial radialis tendons bilaterally. No atrophy.  Cervical spine: Full painless cervical range of motion. Negative Spurling's.       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 She carries heavy trays in her right hand. I made her aware of this and she will try to make some adjustments at work. I've also placed her on Mobic 15 mg to take daily for 7 days then when necessary. If symptoms persist over the next week or two I would consider a referral to physical therapy for education in a home exercise program. Followup for ongoing or recalcitrant issues.

## 2014-01-03 ENCOUNTER — Ambulatory Visit (INDEPENDENT_AMBULATORY_CARE_PROVIDER_SITE_OTHER): Payer: BC Managed Care – PPO | Admitting: Psychology

## 2014-01-03 DIAGNOSIS — F331 Major depressive disorder, recurrent, moderate: Secondary | ICD-10-CM

## 2014-01-10 ENCOUNTER — Ambulatory Visit (INDEPENDENT_AMBULATORY_CARE_PROVIDER_SITE_OTHER): Payer: BC Managed Care – PPO | Admitting: Psychology

## 2014-01-10 DIAGNOSIS — F331 Major depressive disorder, recurrent, moderate: Secondary | ICD-10-CM

## 2014-01-23 ENCOUNTER — Ambulatory Visit (INDEPENDENT_AMBULATORY_CARE_PROVIDER_SITE_OTHER): Payer: BC Managed Care – PPO | Admitting: Psychology

## 2014-01-23 DIAGNOSIS — F331 Major depressive disorder, recurrent, moderate: Secondary | ICD-10-CM

## 2014-02-03 ENCOUNTER — Ambulatory Visit (INDEPENDENT_AMBULATORY_CARE_PROVIDER_SITE_OTHER): Payer: BC Managed Care – PPO | Admitting: Psychology

## 2014-02-03 DIAGNOSIS — F331 Major depressive disorder, recurrent, moderate: Secondary | ICD-10-CM

## 2014-02-07 ENCOUNTER — Ambulatory Visit (INDEPENDENT_AMBULATORY_CARE_PROVIDER_SITE_OTHER): Payer: BC Managed Care – PPO | Admitting: Psychology

## 2014-02-07 DIAGNOSIS — F331 Major depressive disorder, recurrent, moderate: Secondary | ICD-10-CM

## 2014-02-13 ENCOUNTER — Ambulatory Visit (INDEPENDENT_AMBULATORY_CARE_PROVIDER_SITE_OTHER): Payer: BC Managed Care – PPO | Admitting: Psychology

## 2014-02-13 DIAGNOSIS — F331 Major depressive disorder, recurrent, moderate: Secondary | ICD-10-CM

## 2014-02-22 ENCOUNTER — Ambulatory Visit (INDEPENDENT_AMBULATORY_CARE_PROVIDER_SITE_OTHER): Payer: BC Managed Care – PPO | Admitting: Psychology

## 2014-02-22 DIAGNOSIS — F331 Major depressive disorder, recurrent, moderate: Secondary | ICD-10-CM

## 2014-03-03 ENCOUNTER — Ambulatory Visit (INDEPENDENT_AMBULATORY_CARE_PROVIDER_SITE_OTHER): Payer: BC Managed Care – PPO | Admitting: Psychology

## 2014-03-03 DIAGNOSIS — F331 Major depressive disorder, recurrent, moderate: Secondary | ICD-10-CM

## 2014-03-10 ENCOUNTER — Ambulatory Visit (INDEPENDENT_AMBULATORY_CARE_PROVIDER_SITE_OTHER): Payer: BC Managed Care – PPO | Admitting: Psychology

## 2014-03-10 DIAGNOSIS — F331 Major depressive disorder, recurrent, moderate: Secondary | ICD-10-CM

## 2014-03-17 ENCOUNTER — Ambulatory Visit (INDEPENDENT_AMBULATORY_CARE_PROVIDER_SITE_OTHER): Payer: BC Managed Care – PPO | Admitting: Psychology

## 2014-03-17 DIAGNOSIS — F331 Major depressive disorder, recurrent, moderate: Secondary | ICD-10-CM

## 2014-03-24 ENCOUNTER — Ambulatory Visit: Payer: BC Managed Care – PPO | Admitting: Psychology

## 2014-03-31 ENCOUNTER — Ambulatory Visit (INDEPENDENT_AMBULATORY_CARE_PROVIDER_SITE_OTHER): Payer: BC Managed Care – PPO | Admitting: Psychology

## 2014-03-31 DIAGNOSIS — F331 Major depressive disorder, recurrent, moderate: Secondary | ICD-10-CM

## 2014-04-07 ENCOUNTER — Ambulatory Visit (INDEPENDENT_AMBULATORY_CARE_PROVIDER_SITE_OTHER): Payer: BC Managed Care – PPO | Admitting: Psychology

## 2014-04-07 DIAGNOSIS — F331 Major depressive disorder, recurrent, moderate: Secondary | ICD-10-CM

## 2014-04-14 ENCOUNTER — Ambulatory Visit (INDEPENDENT_AMBULATORY_CARE_PROVIDER_SITE_OTHER): Payer: BC Managed Care – PPO | Admitting: Psychology

## 2014-04-14 DIAGNOSIS — F331 Major depressive disorder, recurrent, moderate: Secondary | ICD-10-CM

## 2014-04-21 ENCOUNTER — Ambulatory Visit (INDEPENDENT_AMBULATORY_CARE_PROVIDER_SITE_OTHER): Payer: BC Managed Care – PPO | Admitting: Psychology

## 2014-04-21 DIAGNOSIS — F331 Major depressive disorder, recurrent, moderate: Secondary | ICD-10-CM

## 2014-04-28 ENCOUNTER — Ambulatory Visit (INDEPENDENT_AMBULATORY_CARE_PROVIDER_SITE_OTHER): Payer: BC Managed Care – PPO | Admitting: Psychology

## 2014-04-28 DIAGNOSIS — F331 Major depressive disorder, recurrent, moderate: Secondary | ICD-10-CM

## 2014-05-05 ENCOUNTER — Ambulatory Visit (INDEPENDENT_AMBULATORY_CARE_PROVIDER_SITE_OTHER): Payer: BC Managed Care – PPO | Admitting: Psychology

## 2014-05-05 DIAGNOSIS — F331 Major depressive disorder, recurrent, moderate: Secondary | ICD-10-CM

## 2014-05-12 ENCOUNTER — Ambulatory Visit (INDEPENDENT_AMBULATORY_CARE_PROVIDER_SITE_OTHER): Payer: BC Managed Care – PPO | Admitting: Psychology

## 2014-05-12 DIAGNOSIS — F332 Major depressive disorder, recurrent severe without psychotic features: Secondary | ICD-10-CM

## 2014-05-19 ENCOUNTER — Ambulatory Visit (INDEPENDENT_AMBULATORY_CARE_PROVIDER_SITE_OTHER): Payer: BC Managed Care – PPO | Admitting: Psychology

## 2014-05-19 DIAGNOSIS — F332 Major depressive disorder, recurrent severe without psychotic features: Secondary | ICD-10-CM

## 2014-05-26 ENCOUNTER — Ambulatory Visit (INDEPENDENT_AMBULATORY_CARE_PROVIDER_SITE_OTHER): Payer: BC Managed Care – PPO | Admitting: Psychology

## 2014-05-26 DIAGNOSIS — F332 Major depressive disorder, recurrent severe without psychotic features: Secondary | ICD-10-CM

## 2014-06-02 ENCOUNTER — Ambulatory Visit (INDEPENDENT_AMBULATORY_CARE_PROVIDER_SITE_OTHER): Payer: BC Managed Care – PPO | Admitting: Psychology

## 2014-06-02 DIAGNOSIS — F332 Major depressive disorder, recurrent severe without psychotic features: Secondary | ICD-10-CM

## 2014-06-09 ENCOUNTER — Ambulatory Visit (INDEPENDENT_AMBULATORY_CARE_PROVIDER_SITE_OTHER): Payer: BC Managed Care – PPO | Admitting: Psychology

## 2014-06-09 DIAGNOSIS — F332 Major depressive disorder, recurrent severe without psychotic features: Secondary | ICD-10-CM

## 2014-06-16 ENCOUNTER — Ambulatory Visit (INDEPENDENT_AMBULATORY_CARE_PROVIDER_SITE_OTHER): Payer: BC Managed Care – PPO | Admitting: Psychology

## 2014-06-16 DIAGNOSIS — F332 Major depressive disorder, recurrent severe without psychotic features: Secondary | ICD-10-CM

## 2014-06-23 ENCOUNTER — Ambulatory Visit (INDEPENDENT_AMBULATORY_CARE_PROVIDER_SITE_OTHER): Payer: BC Managed Care – PPO | Admitting: Psychology

## 2014-06-23 DIAGNOSIS — F332 Major depressive disorder, recurrent severe without psychotic features: Secondary | ICD-10-CM

## 2014-06-29 ENCOUNTER — Ambulatory Visit (INDEPENDENT_AMBULATORY_CARE_PROVIDER_SITE_OTHER): Payer: BC Managed Care – PPO | Admitting: Psychology

## 2014-06-29 DIAGNOSIS — F332 Major depressive disorder, recurrent severe without psychotic features: Secondary | ICD-10-CM

## 2014-07-05 ENCOUNTER — Ambulatory Visit: Payer: BC Managed Care – PPO | Admitting: Psychology

## 2014-07-05 ENCOUNTER — Ambulatory Visit (INDEPENDENT_AMBULATORY_CARE_PROVIDER_SITE_OTHER): Payer: BC Managed Care – PPO | Admitting: Psychology

## 2014-07-05 DIAGNOSIS — F332 Major depressive disorder, recurrent severe without psychotic features: Secondary | ICD-10-CM

## 2014-07-14 ENCOUNTER — Ambulatory Visit (INDEPENDENT_AMBULATORY_CARE_PROVIDER_SITE_OTHER): Payer: BC Managed Care – PPO | Admitting: Psychology

## 2014-07-14 DIAGNOSIS — F332 Major depressive disorder, recurrent severe without psychotic features: Secondary | ICD-10-CM

## 2014-07-21 ENCOUNTER — Ambulatory Visit (INDEPENDENT_AMBULATORY_CARE_PROVIDER_SITE_OTHER): Payer: BC Managed Care – PPO | Admitting: Psychology

## 2014-07-21 DIAGNOSIS — F332 Major depressive disorder, recurrent severe without psychotic features: Secondary | ICD-10-CM

## 2014-07-25 ENCOUNTER — Ambulatory Visit (INDEPENDENT_AMBULATORY_CARE_PROVIDER_SITE_OTHER): Payer: BC Managed Care – PPO | Admitting: Physician Assistant

## 2014-07-25 ENCOUNTER — Ambulatory Visit: Payer: BC Managed Care – PPO | Admitting: Physician Assistant

## 2014-07-25 ENCOUNTER — Encounter: Payer: Self-pay | Admitting: Physician Assistant

## 2014-07-25 VITALS — BP 118/77 | HR 74 | Temp 98.6°F | Resp 16 | Ht 70.0 in | Wt 186.5 lb

## 2014-07-25 DIAGNOSIS — B9689 Other specified bacterial agents as the cause of diseases classified elsewhere: Secondary | ICD-10-CM

## 2014-07-25 DIAGNOSIS — J019 Acute sinusitis, unspecified: Secondary | ICD-10-CM

## 2014-07-25 MED ORDER — AMOXICILLIN-POT CLAVULANATE 875-125 MG PO TABS: 1.0000 | ORAL_TABLET | Freq: Two times a day (BID) | ORAL | Status: DC

## 2014-07-25 MED ORDER — HYDROCOD POLST-CHLORPHEN POLST 10-8 MG/5ML PO LQCR
5.0000 mL | Freq: Two times a day (BID) | ORAL | Status: DC | PRN
Start: 2014-07-25 — End: 2015-04-12

## 2014-07-25 NOTE — Progress Notes (Signed)
Pre visit review using our clinic review tool, if applicable. No additional management support is needed unless otherwise documented below in the visit note/SLS  

## 2014-07-25 NOTE — Patient Instructions (Signed)
Please take antibiotic as directed.  Increase fluid intake.  Use Saline nasal spray.  Take a daily multivitamin. Use Tussionex as directed for cough.  Place a humidifier in the bedroom.  Please call or return clinic if symptoms are not improving.  Sinusitis Sinusitis is redness, soreness, and swelling (inflammation) of the paranasal sinuses. Paranasal sinuses are air pockets within the bones of your face (beneath the eyes, the middle of the forehead, or above the eyes). In healthy paranasal sinuses, mucus is able to drain out, and air is able to circulate through them by way of your nose. However, when your paranasal sinuses are inflamed, mucus and air can become trapped. This can allow bacteria and other germs to grow and cause infection. Sinusitis can develop quickly and last only a short time (acute) or continue over a long period (chronic). Sinusitis that lasts for more than 12 weeks is considered chronic.  CAUSES  Causes of sinusitis include:  Allergies.  Structural abnormalities, such as displacement of the cartilage that separates your nostrils (deviated septum), which can decrease the air flow through your nose and sinuses and affect sinus drainage.  Functional abnormalities, such as when the small hairs (cilia) that line your sinuses and help remove mucus do not work properly or are not present. SYMPTOMS  Symptoms of acute and chronic sinusitis are the same. The primary symptoms are pain and pressure around the affected sinuses. Other symptoms include:  Upper toothache.  Earache.  Headache.  Bad breath.  Decreased sense of smell and taste.  A cough, which worsens when you are lying flat.  Fatigue.  Fever.  Thick drainage from your nose, which often is green and may contain pus (purulent).  Swelling and warmth over the affected sinuses. DIAGNOSIS  Your caregiver will perform a physical exam. During the exam, your caregiver may:  Look in your nose for signs of abnormal  growths in your nostrils (nasal polyps).  Tap over the affected sinus to check for signs of infection.  View the inside of your sinuses (endoscopy) with a special imaging device with a light attached (endoscope), which is inserted into your sinuses. If your caregiver suspects that you have chronic sinusitis, one or more of the following tests may be recommended:  Allergy tests.  Nasal culture A sample of mucus is taken from your nose and sent to a lab and screened for bacteria.  Nasal cytology A sample of mucus is taken from your nose and examined by your caregiver to determine if your sinusitis is related to an allergy. TREATMENT  Most cases of acute sinusitis are related to a viral infection and will resolve on their own within 10 days. Sometimes medicines are prescribed to help relieve symptoms (pain medicine, decongestants, nasal steroid sprays, or saline sprays).  However, for sinusitis related to a bacterial infection, your caregiver will prescribe antibiotic medicines. These are medicines that will help kill the bacteria causing the infection.  Rarely, sinusitis is caused by a fungal infection. In theses cases, your caregiver will prescribe antifungal medicine. For some cases of chronic sinusitis, surgery is needed. Generally, these are cases in which sinusitis recurs more than 3 times per year, despite other treatments. HOME CARE INSTRUCTIONS   Drink plenty of water. Water helps thin the mucus so your sinuses can drain more easily.  Use a humidifier.  Inhale steam 3 to 4 times a day (for example, sit in the bathroom with the shower running).  Apply a warm, moist washcloth to your face   3 to 4 times a day, or as directed by your caregiver.  Use saline nasal sprays to help moisten and clean your sinuses.  Take over-the-counter or prescription medicines for pain, discomfort, or fever only as directed by your caregiver. SEEK IMMEDIATE MEDICAL CARE IF:  You have increasing pain or  severe headaches.  You have nausea, vomiting, or drowsiness.  You have swelling around your face.  You have vision problems.  You have a stiff neck.  You have difficulty breathing. MAKE SURE YOU:   Understand these instructions.  Will watch your condition.  Will get help right away if you are not doing well or get worse. Document Released: 07/28/2005 Document Revised: 10/20/2011 Document Reviewed: 08/12/2011 Exit[employer] Patient Information 2014 [employer].   

## 2014-07-26 ENCOUNTER — Ambulatory Visit: Payer: BC Managed Care – PPO | Admitting: Family

## 2014-07-28 ENCOUNTER — Ambulatory Visit (INDEPENDENT_AMBULATORY_CARE_PROVIDER_SITE_OTHER): Payer: BC Managed Care – PPO | Admitting: Psychology

## 2014-07-28 DIAGNOSIS — F332 Major depressive disorder, recurrent severe without psychotic features: Secondary | ICD-10-CM

## 2014-08-18 ENCOUNTER — Ambulatory Visit (INDEPENDENT_AMBULATORY_CARE_PROVIDER_SITE_OTHER): Payer: BLUE CROSS/BLUE SHIELD | Admitting: Psychology

## 2014-08-18 DIAGNOSIS — F332 Major depressive disorder, recurrent severe without psychotic features: Secondary | ICD-10-CM

## 2014-08-25 ENCOUNTER — Ambulatory Visit (INDEPENDENT_AMBULATORY_CARE_PROVIDER_SITE_OTHER): Payer: BLUE CROSS/BLUE SHIELD | Admitting: Psychology

## 2014-08-25 DIAGNOSIS — F332 Major depressive disorder, recurrent severe without psychotic features: Secondary | ICD-10-CM

## 2014-09-01 ENCOUNTER — Ambulatory Visit (INDEPENDENT_AMBULATORY_CARE_PROVIDER_SITE_OTHER): Payer: BLUE CROSS/BLUE SHIELD | Admitting: Psychology

## 2014-09-01 DIAGNOSIS — F332 Major depressive disorder, recurrent severe without psychotic features: Secondary | ICD-10-CM

## 2014-09-08 ENCOUNTER — Ambulatory Visit (INDEPENDENT_AMBULATORY_CARE_PROVIDER_SITE_OTHER): Payer: BLUE CROSS/BLUE SHIELD | Admitting: Psychology

## 2014-09-08 DIAGNOSIS — F332 Major depressive disorder, recurrent severe without psychotic features: Secondary | ICD-10-CM

## 2014-09-15 ENCOUNTER — Ambulatory Visit (INDEPENDENT_AMBULATORY_CARE_PROVIDER_SITE_OTHER): Payer: BLUE CROSS/BLUE SHIELD | Admitting: Psychology

## 2014-09-15 DIAGNOSIS — F332 Major depressive disorder, recurrent severe without psychotic features: Secondary | ICD-10-CM

## 2014-09-22 ENCOUNTER — Ambulatory Visit (INDEPENDENT_AMBULATORY_CARE_PROVIDER_SITE_OTHER): Payer: BLUE CROSS/BLUE SHIELD | Admitting: Psychology

## 2014-09-22 DIAGNOSIS — F332 Major depressive disorder, recurrent severe without psychotic features: Secondary | ICD-10-CM

## 2014-09-29 ENCOUNTER — Ambulatory Visit (INDEPENDENT_AMBULATORY_CARE_PROVIDER_SITE_OTHER): Payer: BLUE CROSS/BLUE SHIELD | Admitting: Psychology

## 2014-09-29 DIAGNOSIS — F332 Major depressive disorder, recurrent severe without psychotic features: Secondary | ICD-10-CM

## 2014-10-06 ENCOUNTER — Ambulatory Visit: Payer: BLUE CROSS/BLUE SHIELD | Admitting: Psychology

## 2014-10-10 ENCOUNTER — Ambulatory Visit (INDEPENDENT_AMBULATORY_CARE_PROVIDER_SITE_OTHER): Payer: BLUE CROSS/BLUE SHIELD | Admitting: Psychology

## 2014-10-10 DIAGNOSIS — F332 Major depressive disorder, recurrent severe without psychotic features: Secondary | ICD-10-CM | POA: Diagnosis not present

## 2014-10-13 ENCOUNTER — Ambulatory Visit: Payer: BLUE CROSS/BLUE SHIELD | Admitting: Psychology

## 2014-10-27 ENCOUNTER — Ambulatory Visit (INDEPENDENT_AMBULATORY_CARE_PROVIDER_SITE_OTHER): Payer: BLUE CROSS/BLUE SHIELD | Admitting: Psychology

## 2014-10-27 DIAGNOSIS — F332 Major depressive disorder, recurrent severe without psychotic features: Secondary | ICD-10-CM

## 2014-10-31 ENCOUNTER — Other Ambulatory Visit: Payer: Self-pay | Admitting: Internal Medicine

## 2014-11-06 ENCOUNTER — Ambulatory Visit (INDEPENDENT_AMBULATORY_CARE_PROVIDER_SITE_OTHER): Payer: BLUE CROSS/BLUE SHIELD | Admitting: Psychology

## 2014-11-06 DIAGNOSIS — F332 Major depressive disorder, recurrent severe without psychotic features: Secondary | ICD-10-CM

## 2014-11-10 ENCOUNTER — Ambulatory Visit (INDEPENDENT_AMBULATORY_CARE_PROVIDER_SITE_OTHER): Payer: BLUE CROSS/BLUE SHIELD | Admitting: Psychology

## 2014-11-10 DIAGNOSIS — F332 Major depressive disorder, recurrent severe without psychotic features: Secondary | ICD-10-CM | POA: Diagnosis not present

## 2014-11-17 ENCOUNTER — Ambulatory Visit (INDEPENDENT_AMBULATORY_CARE_PROVIDER_SITE_OTHER): Payer: BLUE CROSS/BLUE SHIELD | Admitting: Psychology

## 2014-11-17 DIAGNOSIS — F332 Major depressive disorder, recurrent severe without psychotic features: Secondary | ICD-10-CM | POA: Diagnosis not present

## 2014-11-24 ENCOUNTER — Ambulatory Visit (INDEPENDENT_AMBULATORY_CARE_PROVIDER_SITE_OTHER): Payer: BLUE CROSS/BLUE SHIELD | Admitting: Psychology

## 2014-11-24 DIAGNOSIS — F332 Major depressive disorder, recurrent severe without psychotic features: Secondary | ICD-10-CM | POA: Diagnosis not present

## 2014-12-01 ENCOUNTER — Ambulatory Visit (INDEPENDENT_AMBULATORY_CARE_PROVIDER_SITE_OTHER): Payer: BLUE CROSS/BLUE SHIELD | Admitting: Psychology

## 2014-12-01 DIAGNOSIS — F332 Major depressive disorder, recurrent severe without psychotic features: Secondary | ICD-10-CM | POA: Diagnosis not present

## 2014-12-08 ENCOUNTER — Ambulatory Visit (INDEPENDENT_AMBULATORY_CARE_PROVIDER_SITE_OTHER): Payer: BLUE CROSS/BLUE SHIELD | Admitting: Psychology

## 2014-12-08 DIAGNOSIS — F332 Major depressive disorder, recurrent severe without psychotic features: Secondary | ICD-10-CM

## 2014-12-15 ENCOUNTER — Ambulatory Visit (INDEPENDENT_AMBULATORY_CARE_PROVIDER_SITE_OTHER): Payer: BLUE CROSS/BLUE SHIELD | Admitting: Psychology

## 2014-12-15 DIAGNOSIS — F332 Major depressive disorder, recurrent severe without psychotic features: Secondary | ICD-10-CM | POA: Diagnosis not present

## 2014-12-22 ENCOUNTER — Ambulatory Visit (INDEPENDENT_AMBULATORY_CARE_PROVIDER_SITE_OTHER): Payer: BLUE CROSS/BLUE SHIELD | Admitting: Psychology

## 2014-12-22 DIAGNOSIS — F332 Major depressive disorder, recurrent severe without psychotic features: Secondary | ICD-10-CM | POA: Diagnosis not present

## 2014-12-29 ENCOUNTER — Ambulatory Visit (INDEPENDENT_AMBULATORY_CARE_PROVIDER_SITE_OTHER): Payer: BLUE CROSS/BLUE SHIELD | Admitting: Psychology

## 2014-12-29 DIAGNOSIS — F332 Major depressive disorder, recurrent severe without psychotic features: Secondary | ICD-10-CM | POA: Diagnosis not present

## 2015-01-11 ENCOUNTER — Ambulatory Visit (INDEPENDENT_AMBULATORY_CARE_PROVIDER_SITE_OTHER): Payer: BLUE CROSS/BLUE SHIELD | Admitting: Psychology

## 2015-01-11 DIAGNOSIS — F332 Major depressive disorder, recurrent severe without psychotic features: Secondary | ICD-10-CM

## 2015-01-19 ENCOUNTER — Ambulatory Visit (INDEPENDENT_AMBULATORY_CARE_PROVIDER_SITE_OTHER): Payer: BLUE CROSS/BLUE SHIELD | Admitting: Psychology

## 2015-01-19 DIAGNOSIS — F332 Major depressive disorder, recurrent severe without psychotic features: Secondary | ICD-10-CM

## 2015-01-26 ENCOUNTER — Ambulatory Visit: Payer: BLUE CROSS/BLUE SHIELD | Admitting: Psychology

## 2015-02-02 ENCOUNTER — Ambulatory Visit (INDEPENDENT_AMBULATORY_CARE_PROVIDER_SITE_OTHER): Payer: BLUE CROSS/BLUE SHIELD | Admitting: Psychology

## 2015-02-02 DIAGNOSIS — F332 Major depressive disorder, recurrent severe without psychotic features: Secondary | ICD-10-CM

## 2015-02-09 ENCOUNTER — Ambulatory Visit (INDEPENDENT_AMBULATORY_CARE_PROVIDER_SITE_OTHER): Payer: BLUE CROSS/BLUE SHIELD | Admitting: Psychology

## 2015-02-09 DIAGNOSIS — F332 Major depressive disorder, recurrent severe without psychotic features: Secondary | ICD-10-CM | POA: Diagnosis not present

## 2015-02-16 ENCOUNTER — Ambulatory Visit (INDEPENDENT_AMBULATORY_CARE_PROVIDER_SITE_OTHER): Payer: BLUE CROSS/BLUE SHIELD | Admitting: Psychology

## 2015-02-16 DIAGNOSIS — F332 Major depressive disorder, recurrent severe without psychotic features: Secondary | ICD-10-CM | POA: Diagnosis not present

## 2015-02-23 ENCOUNTER — Ambulatory Visit (INDEPENDENT_AMBULATORY_CARE_PROVIDER_SITE_OTHER): Payer: BLUE CROSS/BLUE SHIELD | Admitting: Psychology

## 2015-02-23 DIAGNOSIS — F332 Major depressive disorder, recurrent severe without psychotic features: Secondary | ICD-10-CM | POA: Diagnosis not present

## 2015-03-02 ENCOUNTER — Ambulatory Visit (INDEPENDENT_AMBULATORY_CARE_PROVIDER_SITE_OTHER): Payer: BLUE CROSS/BLUE SHIELD | Admitting: Psychology

## 2015-03-02 DIAGNOSIS — F332 Major depressive disorder, recurrent severe without psychotic features: Secondary | ICD-10-CM

## 2015-03-09 ENCOUNTER — Ambulatory Visit (INDEPENDENT_AMBULATORY_CARE_PROVIDER_SITE_OTHER): Payer: BLUE CROSS/BLUE SHIELD | Admitting: Psychology

## 2015-03-09 DIAGNOSIS — F332 Major depressive disorder, recurrent severe without psychotic features: Secondary | ICD-10-CM | POA: Diagnosis not present

## 2015-03-16 ENCOUNTER — Ambulatory Visit (INDEPENDENT_AMBULATORY_CARE_PROVIDER_SITE_OTHER): Payer: BLUE CROSS/BLUE SHIELD | Admitting: Psychology

## 2015-03-16 DIAGNOSIS — F332 Major depressive disorder, recurrent severe without psychotic features: Secondary | ICD-10-CM

## 2015-03-23 ENCOUNTER — Ambulatory Visit (INDEPENDENT_AMBULATORY_CARE_PROVIDER_SITE_OTHER): Payer: BLUE CROSS/BLUE SHIELD | Admitting: Psychology

## 2015-03-23 DIAGNOSIS — F332 Major depressive disorder, recurrent severe without psychotic features: Secondary | ICD-10-CM

## 2015-03-27 ENCOUNTER — Ambulatory Visit: Payer: Self-pay | Admitting: Family Medicine

## 2015-03-27 DIAGNOSIS — Z0289 Encounter for other administrative examinations: Secondary | ICD-10-CM

## 2015-03-30 ENCOUNTER — Ambulatory Visit (INDEPENDENT_AMBULATORY_CARE_PROVIDER_SITE_OTHER): Payer: BLUE CROSS/BLUE SHIELD | Admitting: Psychology

## 2015-03-30 DIAGNOSIS — F332 Major depressive disorder, recurrent severe without psychotic features: Secondary | ICD-10-CM

## 2015-04-12 ENCOUNTER — Ambulatory Visit (INDEPENDENT_AMBULATORY_CARE_PROVIDER_SITE_OTHER): Payer: BLUE CROSS/BLUE SHIELD | Admitting: Family Medicine

## 2015-04-12 VITALS — BP 116/74 | HR 93 | Temp 98.3°F | Resp 18 | Ht 72.5 in | Wt 206.6 lb

## 2015-04-12 DIAGNOSIS — L259 Unspecified contact dermatitis, unspecified cause: Secondary | ICD-10-CM

## 2015-04-12 MED ORDER — PREDNISONE 20 MG PO TABS: ORAL_TABLET | ORAL | Status: DC

## 2015-04-12 NOTE — Patient Instructions (Signed)
We are going to treat you with prednisone for your contact dermatitis rash and itching If you have any concerns or worsening let me know You can continue to use benadryl, topical steroid, calamine, etc as needed

## 2015-04-12 NOTE — Progress Notes (Signed)
[facility] [address] [place] [address] [phone]  Date:  04/12/2015   Name:  Robyn Long   DOB:  1985/09/17   MRN:  [id number]  PCP:  Oliver Barre, MD    Chief Complaint: Rash   History of Present Illness:  Robyn Long is a 29 y.o. very pleasant female patient who presents with the following:  Generally healthy young lady here today with a rash on her lower legs. She noted the first area about one week go, and more areas coming up over the last week.  She is not aware of any exposure to any allergen, but they do have a dog who runs around outdoors Started with itchiness, and now painful  She is otherwise feeling well, no fever, etc.  No lip or tongue swelling LMP 8/18 She tried some tea tree oil, cortisone, benadryl Takes prozac for depression, no history of mania  Patient Active Problem List   Diagnosis Date Noted  . Acute bacterial sinusitis 07/25/2014  . Acute pyelonephritis 07/12/2013  . Cervical intraepithelial neoplasia grade 2 02/10/2013  . Knee pain, right anterior 01/06/2013  . Depression 11/26/2012    Past Medical History  Diagnosis Date  . Arthritis   . Allergy   . History of chicken pox   . Depression     History reviewed. No pertinent past surgical history.  Social History  Substance Use Topics  . Smoking status: Never Smoker   . Smokeless tobacco: Never Used  . Alcohol Use: 0.6 oz/week    1 Glasses of wine per week     Comment: 4 days a week    Family History  Problem Relation Age of Onset  . Mitral valve prolapse Father   . Mitral valve prolapse Maternal Aunt   . Hypertension Paternal Grandfather   . Mitral valve prolapse Maternal Aunt   . Breast cancer Other     strong famiy history on both sides  . Stroke Neg Hx     No Known Allergies  Medication list has been reviewed and updated.  Current Outpatient Prescriptions on File Prior to Visit  Medication Sig Dispense Refill  . FLUoxetine (PROZAC) 40  MG capsule TAKE 1 CAPSULE (40 MG TOTAL) BY MOUTH DAILY. 90 capsule 0  . glucosamine-chondroitin 500-400 MG tablet Take 1 tablet by mouth daily.    . Multiple Vitamins-Minerals (WOMENS MULTIVITAMIN PLUS) TABS Take 1 tablet by mouth daily.    Marland Kitchen amoxicillin-clavulanate (AUGMENTIN) 875-125 MG per tablet Take 1 tablet by mouth 2 (two) times daily. (Patient not taking: Reported on 04/12/2015) 20 tablet 0  . chlorpheniramine-HYDROcodone (TUSSIONEX PENNKINETIC ER) 10-8 MG/5ML LQCR Take 5 mLs by mouth every 12 (twelve) hours as needed. (Patient not taking: Reported on 04/12/2015) 115 mL 0   No current facility-administered medications on file prior to visit.    Review of Systems:  As per HPI- otherwise negative.   Physical Examination: Filed Vitals:   04/12/15 0911  BP: 116/74  Pulse: 93  Temp: 98.3 F (36.8 C)  Resp: 18   Filed Vitals:   04/12/15 0911  Height: 6' 0.5" (1.842 m)  Weight: 206 lb 9.6 oz (93.713 kg)   Body mass index is 27.62 kg/(m^2). Ideal Body Weight: Weight in (lb) to have BMI = 25: 186.5  GEN: WDWN, NAD, Non-toxic, A & O x 3 HEENT: Atraumatic, Normocephalic. Neck supple. No masses, No LAD.  Bilateral TM wnl, oropharynx normal.  PEERL,EOMI.   Ears and Nose: No external  deformity. CV: RRR, No M/G/R. No JVD. No thrill. No extra heart sounds. PULM: CTA B, no wheezes, crackles, rhonchi. No retractions. No resp. distress. No accessory muscle use.Marland Kitchen EXTR: No c/c/e NEURO Normal gait.  PSYCH: Normally interactive. Conversant. Not depressed or anxious appearing.  Calm demeanor.  She has scattered patches of contact derm on both legs at various stages of development.  Newer areas with linear marks of vesicles and clear fluid weeping, appears c/w rhus  Assessment and Plan: Contact dermatitis - Plan: predniSONE (DELTASONE) 20 MG tablet discussed various treatment options- she would like to use oral prednisone as her itching is really bad.   Discussed possible SE.  She will  follow-up if not improving  Signed Abbe Amsterdam, MD

## 2015-04-13 ENCOUNTER — Ambulatory Visit (INDEPENDENT_AMBULATORY_CARE_PROVIDER_SITE_OTHER): Payer: BLUE CROSS/BLUE SHIELD | Admitting: Psychology

## 2015-04-13 DIAGNOSIS — F332 Major depressive disorder, recurrent severe without psychotic features: Secondary | ICD-10-CM | POA: Diagnosis not present

## 2015-04-20 ENCOUNTER — Ambulatory Visit: Payer: BLUE CROSS/BLUE SHIELD | Admitting: Psychology

## 2015-05-03 ENCOUNTER — Ambulatory Visit: Payer: BLUE CROSS/BLUE SHIELD | Admitting: Psychology

## 2015-05-11 ENCOUNTER — Ambulatory Visit (INDEPENDENT_AMBULATORY_CARE_PROVIDER_SITE_OTHER): Payer: BLUE CROSS/BLUE SHIELD | Admitting: Psychology

## 2015-05-11 DIAGNOSIS — F332 Major depressive disorder, recurrent severe without psychotic features: Secondary | ICD-10-CM | POA: Diagnosis not present

## 2015-05-18 ENCOUNTER — Ambulatory Visit (INDEPENDENT_AMBULATORY_CARE_PROVIDER_SITE_OTHER): Payer: BLUE CROSS/BLUE SHIELD | Admitting: Psychology

## 2015-05-18 DIAGNOSIS — F332 Major depressive disorder, recurrent severe without psychotic features: Secondary | ICD-10-CM

## 2015-05-25 ENCOUNTER — Ambulatory Visit (INDEPENDENT_AMBULATORY_CARE_PROVIDER_SITE_OTHER): Payer: BLUE CROSS/BLUE SHIELD | Admitting: Psychology

## 2015-05-25 DIAGNOSIS — F332 Major depressive disorder, recurrent severe without psychotic features: Secondary | ICD-10-CM

## 2015-06-01 ENCOUNTER — Ambulatory Visit (INDEPENDENT_AMBULATORY_CARE_PROVIDER_SITE_OTHER): Payer: BLUE CROSS/BLUE SHIELD | Admitting: Psychology

## 2015-06-01 DIAGNOSIS — F332 Major depressive disorder, recurrent severe without psychotic features: Secondary | ICD-10-CM | POA: Diagnosis not present

## 2015-06-08 ENCOUNTER — Ambulatory Visit (INDEPENDENT_AMBULATORY_CARE_PROVIDER_SITE_OTHER): Payer: BLUE CROSS/BLUE SHIELD | Admitting: Psychology

## 2015-06-08 ENCOUNTER — Telehealth: Payer: Self-pay | Admitting: Internal Medicine

## 2015-06-08 DIAGNOSIS — F332 Major depressive disorder, recurrent severe without psychotic features: Secondary | ICD-10-CM | POA: Diagnosis not present

## 2015-06-08 NOTE — Telephone Encounter (Signed)
Pt request to transfer from Dr. Jonny RuizJohn to Dr. Lawerance BachBurns. Please advise.

## 2015-06-08 NOTE — Telephone Encounter (Signed)
Ok with me 

## 2015-06-09 NOTE — Telephone Encounter (Signed)
Ok with me 

## 2015-06-11 ENCOUNTER — Encounter: Payer: Self-pay | Admitting: Internal Medicine

## 2015-06-11 ENCOUNTER — Ambulatory Visit (INDEPENDENT_AMBULATORY_CARE_PROVIDER_SITE_OTHER): Payer: BLUE CROSS/BLUE SHIELD | Admitting: Internal Medicine

## 2015-06-11 ENCOUNTER — Other Ambulatory Visit (INDEPENDENT_AMBULATORY_CARE_PROVIDER_SITE_OTHER): Payer: BLUE CROSS/BLUE SHIELD

## 2015-06-11 VITALS — BP 112/66 | HR 83 | Temp 98.3°F | Resp 16 | Wt 207.0 lb

## 2015-06-11 DIAGNOSIS — R5383 Other fatigue: Secondary | ICD-10-CM | POA: Diagnosis not present

## 2015-06-11 DIAGNOSIS — J069 Acute upper respiratory infection, unspecified: Secondary | ICD-10-CM | POA: Diagnosis not present

## 2015-06-11 LAB — COMPREHENSIVE METABOLIC PANEL
ALK PHOS: 68 U/L (ref 39–117)
ALT: 8 U/L (ref 0–35)
AST: 17 U/L (ref 0–37)
Albumin: 4.3 g/dL (ref 3.5–5.2)
BILIRUBIN TOTAL: 0.5 mg/dL (ref 0.2–1.2)
BUN: 17 mg/dL (ref 6–23)
CALCIUM: 9.5 mg/dL (ref 8.4–10.5)
CO2: 27 mEq/L (ref 19–32)
CREATININE: 0.73 mg/dL (ref 0.40–1.20)
Chloride: 104 mEq/L (ref 96–112)
GFR: 99.44 mL/min (ref >60.00)
Glucose, Bld: 87 mg/dL (ref 70–99)
Potassium: 4.1 mEq/L (ref 3.5–5.1)
Sodium: 140 mEq/L (ref 135–145)
TOTAL PROTEIN: 7.6 g/dL (ref 6.0–8.3)

## 2015-06-11 LAB — CBC WITH DIFFERENTIAL/PLATELET
Basophils Absolute: 0 10*3/uL (ref 0.0–0.1)
Basophils Relative: 0.3 % (ref 0.0–3.0)
EOS ABS: 0.1 10*3/uL (ref 0.0–0.7)
Eosinophils Relative: 1.9 % (ref 0.0–5.0)
HCT: 40.6 % (ref 36.0–46.0)
Hemoglobin: 13.7 g/dL (ref 12.0–15.0)
LYMPHS PCT: 29.4 % (ref 12.0–46.0)
Lymphs Abs: 1.4 10*3/uL (ref 0.7–4.0)
MCHC: 33.7 g/dL (ref 30.0–36.0)
MCV: 94.3 fl (ref 78.0–100.0)
MONOS PCT: 9.9 % (ref 3.0–12.0)
Monocytes Absolute: 0.5 10*3/uL (ref 0.1–1.0)
Neutro Abs: 2.8 10*3/uL (ref 1.4–7.7)
Neutrophils Relative %: 58.5 % (ref 43.0–77.0)
Platelets: 265 10*3/uL (ref 150.0–400.0)
RBC: 4.3 Mil/uL (ref 3.87–5.11)
RDW: 13 % (ref 11.5–15.5)
WBC: 4.7 10*3/uL (ref 4.0–10.5)

## 2015-06-11 LAB — TSH: TSH: 1.07 u[IU]/mL (ref 0.35–4.50)

## 2015-06-11 MED ORDER — AZITHROMYCIN 250 MG PO TABS: ORAL_TABLET | ORAL | Status: DC

## 2015-06-11 NOTE — Telephone Encounter (Signed)
Left detail massage for pt to call back and schedule an appt with Dr. Lawerance BachBurns.

## 2015-06-11 NOTE — Progress Notes (Signed)
Pre visit review using our clinic review tool, if applicable. No additional management support is needed unless otherwise documented below in the visit note. 

## 2015-06-11 NOTE — Patient Instructions (Signed)
   Test(s) ordered today. Your results will be released to MyChart (or called to you) after review, usually within 72hours after test completion. If any changes need to be made, you will be notified at that same time.  Your prescription(s) have been submitted to your pharmacy. Please take as directed and contact our office if you believe you are having problem(s) with the medication(s).  Call if your symptoms do not improve.

## 2015-06-11 NOTE — Progress Notes (Signed)
Subjective:    Patient ID: Robyn Long, female    DOB: 12/01/1985, 29 y.o.   MRN: [id number]  HPI She is for a cough.  Cough:  She initially thought she had allergies at first and then felt like a cold.  Some of the cold symptoms resolved, but the cough and fatigue stayed.  Her symptoms started about six weeks ago.  She is currently not taking any cold symptoms.  The cough is mostly dry but occasionally she brings up some phlegm.  She denies fever, sob and wheeze.    She denies any changes in meds or lifestyle that may have caused the fatigue.  Medications and allergies reviewed with patient and updated if appropriate.  Patient Active Problem List   Diagnosis Date Noted  . Acute bacterial sinusitis 07/25/2014  . Acute pyelonephritis 07/12/2013  . Cervical intraepithelial neoplasia grade 2 02/10/2013  . Knee pain, right anterior 01/06/2013  . Depression 11/26/2012    Past Medical History  Diagnosis Date  . Arthritis   . Allergy   . History of chicken pox   . Depression     No past surgical history on file.  Social History   Social History  . Marital Status: Single    Spouse Name: N/A  . Number of Children: N/A  . Years of Education: 16+   Occupational History  . Server/Freelance Dancer    Social History Main Topics  . Smoking status: Never Smoker   . Smokeless tobacco: Never Used  . Alcohol Use: 0.6 oz/week    1 Glasses of wine per week     Comment: 4 days a week  . Drug Use: No  . Sexual Activity: Yes    Birth Control/ Protection: Condom   Other Topics Concern  . None   Social History Narrative   Caffeine Use-yes   Regular exercise-yes    Review of Systems  Constitutional: Positive for appetite change (decreased), fatigue and unexpected weight change (weight gain). Negative for fever and chills.  HENT: Negative for congestion, ear pain, postnasal drip, rhinorrhea, sinus pressure and sore throat.   Respiratory: Positive for cough (mostly dry,  occasional productive; worse at night). Negative for shortness of breath and wheezing.   Cardiovascular: Negative for chest pain.  Gastrointestinal: Positive for constipation. Negative for abdominal pain and diarrhea.       No GERD  Genitourinary: Negative for dysuria and frequency.  Musculoskeletal: Negative for myalgias.  Neurological: Positive for headaches (sinus). Negative for dizziness and light-headedness.       Objective:   Filed Vitals:   06/11/15 0932  BP: 112/66  Pulse: 83  Temp: 98.3 F (36.8 C)  Resp: 16   Filed Weights   06/11/15 0932  Weight: 207 lb (93.895 kg)   Body mass index is 27.67 kg/(m^2).   Physical Exam  Constitutional: She appears well-developed and well-nourished.  HENT:  Head: Normocephalic and atraumatic.  Right Ear: External ear normal.  Nose: Nose normal.  Mouth/Throat: Oropharynx is clear and moist. No oropharyngeal exudate.  Eyes: Conjunctivae are normal.  Neck: Neck supple. No tracheal deviation present. No thyromegaly present.  Cardiovascular: Normal rate, regular rhythm and normal heart sounds.   No murmur heard. Pulmonary/Chest: Effort normal and breath sounds normal. No respiratory distress. She has no wheezes. She has no rales.  Musculoskeletal: She exhibits no edema.  Lymphadenopathy:    She has no cervical adenopathy.  Psychiatric:  Cried during visit - frustrated from being sick so long  Assessment & Plan:   URI/cough, fatigue ? Atypical URI - given that there was no improvement and she still has the cough without evidence of allergies will try treating for an atypical infection zpak sent to pharmacy Cough medicine otc as needed Continue increased rest, fluids  Will order basic blood work for fatigue to make sure there is no another cause for that such as a thyroid disorder given her weight gain, constipation and fatigue.

## 2015-06-12 ENCOUNTER — Encounter: Payer: BLUE CROSS/BLUE SHIELD | Admitting: Certified Nurse Midwife

## 2015-06-20 ENCOUNTER — Ambulatory Visit: Payer: Self-pay | Admitting: Family Medicine

## 2015-06-20 DIAGNOSIS — Z0289 Encounter for other administrative examinations: Secondary | ICD-10-CM

## 2015-06-22 ENCOUNTER — Ambulatory Visit (INDEPENDENT_AMBULATORY_CARE_PROVIDER_SITE_OTHER): Payer: BLUE CROSS/BLUE SHIELD | Admitting: Psychology

## 2015-06-22 DIAGNOSIS — F332 Major depressive disorder, recurrent severe without psychotic features: Secondary | ICD-10-CM

## 2015-06-29 ENCOUNTER — Encounter: Payer: BLUE CROSS/BLUE SHIELD | Admitting: Certified Nurse Midwife

## 2015-06-29 ENCOUNTER — Ambulatory Visit (INDEPENDENT_AMBULATORY_CARE_PROVIDER_SITE_OTHER): Payer: BLUE CROSS/BLUE SHIELD | Admitting: Psychology

## 2015-06-29 DIAGNOSIS — F332 Major depressive disorder, recurrent severe without psychotic features: Secondary | ICD-10-CM

## 2015-07-13 ENCOUNTER — Ambulatory Visit (INDEPENDENT_AMBULATORY_CARE_PROVIDER_SITE_OTHER): Payer: BLUE CROSS/BLUE SHIELD | Admitting: Psychology

## 2015-07-13 DIAGNOSIS — F332 Major depressive disorder, recurrent severe without psychotic features: Secondary | ICD-10-CM

## 2015-07-18 ENCOUNTER — Encounter: Payer: Self-pay | Admitting: Certified Nurse Midwife

## 2015-07-18 ENCOUNTER — Ambulatory Visit (INDEPENDENT_AMBULATORY_CARE_PROVIDER_SITE_OTHER): Payer: BLUE CROSS/BLUE SHIELD | Admitting: Certified Nurse Midwife

## 2015-07-18 VITALS — BP 108/68 | HR 80 | Resp 20 | Ht 70.75 in | Wt 207.0 lb

## 2015-07-18 DIAGNOSIS — Z8742 Personal history of other diseases of the female genital tract: Secondary | ICD-10-CM

## 2015-07-18 DIAGNOSIS — Z124 Encounter for screening for malignant neoplasm of cervix: Secondary | ICD-10-CM

## 2015-07-18 DIAGNOSIS — Z01419 Encounter for gynecological examination (general) (routine) without abnormal findings: Secondary | ICD-10-CM

## 2015-07-18 NOTE — Progress Notes (Addendum)
29 y.o. G0P0000 Single  Caucasian Fe here to establish gyn care and  for annual exam. Periods normal, short 21 day cycle and 3 day duration. Last 2 periods have been slightly heavier. Sees Cheryll CockayneStacy Burns PCP for aex and labs. Deatra RobinsonKaren Jones for depression  Management with Prozac working well. Patient has had weight of approximately 20 pounds in past few months. Has had thyroid checked and all labs normal with PCP.exercising, but has not worked on diet control. Getting married in spring and plans trying for pregnancy shortly after. History of abnormal pap with HPVHR with cryo. Follow up pap in 2015 normal. No other health concerns today.  Patient's last menstrual period was 07/12/2015.          Sexually active: Yes.    The current method of family planning is condoms all the time.    Exercising: Yes.    yoga,running, weights, swimming Smoker:  no  Health Maintenance: Pap:  2015 neg, cryo 2014 MMG:  none Colonoscopy:  none BMD:   none TDaP:  2010 Labs: pcp Self breast exam: not done   reports that she has never smoked. She has never used smokeless tobacco. She reports that she drinks about 1.8 - 2.4 oz of alcohol per week. She reports that she does not use illicit drugs.  Past Medical History  Diagnosis Date  . Arthritis   . Allergy   . History of chicken pox   . Depression   . Abnormal Pap smear of cervix 2014    Past Surgical History  Procedure Laterality Date  . Cryotherapy  2014    for abnormal pap smear    Current Outpatient Prescriptions  Medication Sig Dispense Refill  . FLUoxetine (PROZAC) 40 MG capsule TAKE 1 CAPSULE (40 MG TOTAL) BY MOUTH DAILY. 90 capsule 0  . glucosamine-chondroitin 500-400 MG tablet Take 1 tablet by mouth daily.    . Multiple Vitamins-Minerals (WOMENS MULTIVITAMIN PLUS) TABS Take 1 tablet by mouth daily.     No current facility-administered medications for this visit.    Family History  Problem Relation Age of Onset  . Mitral valve prolapse Father    . Breast cancer Maternal Aunt   . Lung cancer Paternal Grandfather   . Hypertension Mother   . Mitral valve prolapse Paternal Aunt   . Lung cancer Maternal Grandfather   . Mitral valve prolapse Paternal Aunt     ROS:  Pertinent items are noted in HPI.  Otherwise, a comprehensive ROS was negative.  Exam:   BP 108/68 mmHg  Pulse 80  Resp 20  Ht 5' 10.75" (1.797 m)  Wt 207 lb (93.895 kg)  BMI 29.08 kg/m2  LMP 07/12/2015 Height: 5' 10.75" (179.7 cm) Ht Readings from Last 3 Encounters:  07/18/15 5' 10.75" (1.797 m)  04/12/15 6' 0.5" (1.842 m)  07/25/14 5\' 10"  (1.778 m)    General appearance: alert, cooperative and appears stated age Head: Normocephalic, without obvious abnormality, atraumatic Neck: no adenopathy, supple, symmetrical, trachea midline and thyroid normal to inspection and palpation Lungs: clear to auscultation bilaterally Breasts: normal appearance, no masses or tenderness, No nipple retraction or dimpling, No nipple discharge or bleeding, No axillary or supraclavicular adenopathy, Taught monthly breast self examination Heart: regular rate and rhythm Abdomen: soft, non-tender; no masses,  no organomegaly Extremities: extremities normal, atraumatic, no cyanosis or edema Skin: Skin color, texture, turgor normal. No rashes or lesions Lymph nodes: Cervical, supraclavicular, and axillary nodes normal. No abnormal inguinal nodes palpated Neurologic: Grossly normal  Pelvic: External genitalia:  no lesions              Urethra:  normal appearing urethra with no masses, tenderness or lesions              Bartholin's and Skene's: normal                 Vagina: normal appearing vagina with normal color and discharge, no lesions              Cervix: normal appearance, non tender, no lesions              Pap taken: Yes.   Bimanual Exam:  Uterus:  normal size, contour, position, consistency, mobility, non-tender and anteverted              Adnexa: normal adnexa and no mass,  fullness, tenderness               Rectovaginal: Confirms               Anus:  normal sphincter tone, no lesions  Chaperone present: yes  A:  Well Woman with normal exam  Contraception condoms  History of abnormal pap smear with Cryo ? HPVHR related  Second follow up pap today  Menstrual cycle change, probable weight change influence  P:   Reviewed health and wellness pertinent to exam  Discussed will need previous pap history and biopsy report, patient will request. If abnormal per results management.  Discussed keeping menses calendar. Discussed weight change and change in cycles relationship. Questions addressed. Warning signs of bleeding given and need to advise if occurs.  Pap smear as above with HPVHR    counseled on breast self exam, adequate intake of calcium and vitamin D, diet and exercise and preconceptual consult when ready to plan pregnancy.  return annually or prn  An After Visit Summary was printed and given to the patient.  Addendum regarding abnormal pap smear in 2014 was ASCUS + HPVHR. 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

## 2015-07-18 NOTE — Patient Instructions (Signed)

## 2015-07-20 ENCOUNTER — Ambulatory Visit (INDEPENDENT_AMBULATORY_CARE_PROVIDER_SITE_OTHER): Payer: BLUE CROSS/BLUE SHIELD | Admitting: Psychology

## 2015-07-20 DIAGNOSIS — F332 Major depressive disorder, recurrent severe without psychotic features: Secondary | ICD-10-CM

## 2015-07-20 LAB — IPS PAP TEST WITH HPV

## 2015-07-25 NOTE — Progress Notes (Signed)
Reviewed personally.  M. Suzanne Clarence Cogswell, MD.  

## 2015-07-27 ENCOUNTER — Ambulatory Visit (INDEPENDENT_AMBULATORY_CARE_PROVIDER_SITE_OTHER): Payer: BLUE CROSS/BLUE SHIELD | Admitting: Psychology

## 2015-07-27 DIAGNOSIS — F332 Major depressive disorder, recurrent severe without psychotic features: Secondary | ICD-10-CM

## 2015-07-27 NOTE — Addendum Note (Signed)
Addended by: Verner CholLEONARD, Brandin Stetzer S on: 07/27/2015 12:48 PM   Modules accepted: Kipp BroodSmartSet

## 2015-07-31 ENCOUNTER — Ambulatory Visit (INDEPENDENT_AMBULATORY_CARE_PROVIDER_SITE_OTHER): Payer: BLUE CROSS/BLUE SHIELD | Admitting: Internal Medicine

## 2015-07-31 ENCOUNTER — Encounter: Payer: Self-pay | Admitting: Internal Medicine

## 2015-07-31 ENCOUNTER — Ambulatory Visit (INDEPENDENT_AMBULATORY_CARE_PROVIDER_SITE_OTHER)
Admission: RE | Admit: 2015-07-31 | Discharge: 2015-07-31 | Disposition: A | Discharge status: Home / Self Care | Payer: BLUE CROSS/BLUE SHIELD | Source: Ambulatory Visit | Attending: Internal Medicine | Admitting: Internal Medicine

## 2015-07-31 ENCOUNTER — Other Ambulatory Visit (INDEPENDENT_AMBULATORY_CARE_PROVIDER_SITE_OTHER): Payer: BLUE CROSS/BLUE SHIELD

## 2015-07-31 VITALS — BP 106/60 | HR 96 | Temp 98.9°F | Resp 16 | Wt 207.0 lb

## 2015-07-31 DIAGNOSIS — K5900 Constipation, unspecified: Secondary | ICD-10-CM | POA: Diagnosis not present

## 2015-07-31 DIAGNOSIS — R109 Unspecified abdominal pain: Secondary | ICD-10-CM

## 2015-07-31 LAB — COMPREHENSIVE METABOLIC PANEL
ALBUMIN: 4.5 g/dL (ref 3.5–5.2)
ALT: 8 U/L (ref 0–35)
AST: 16 U/L (ref 0–37)
Alkaline Phosphatase: 68 U/L (ref 39–117)
BILIRUBIN TOTAL: 0.4 mg/dL (ref 0.2–1.2)
BUN: 17 mg/dL (ref 6–23)
CHLORIDE: 103 meq/L (ref 96–112)
CO2: 29 meq/L (ref 19–32)
Calcium: 9.8 mg/dL (ref 8.4–10.5)
Creatinine, Ser: 0.86 mg/dL (ref 0.40–1.20)
GFR: 82.23 mL/min (ref >60.00)
Glucose, Bld: 116 mg/dL — ABNORMAL HIGH (ref 70–99)
Potassium: 3.9 mEq/L (ref 3.5–5.1)
SODIUM: 140 meq/L (ref 135–145)
Total Protein: 7.7 g/dL (ref 6.0–8.3)

## 2015-07-31 LAB — URINALYSIS, ROUTINE W REFLEX MICROSCOPIC
BILIRUBIN URINE: NEGATIVE
HGB URINE DIPSTICK: NEGATIVE
KETONES UR: NEGATIVE
LEUKOCYTES UA: NEGATIVE
Nitrite: NEGATIVE
RBC / HPF: NONE SEEN (ref 0–?)
Specific Gravity, Urine: 1.015 (ref 1.000–1.030)
URINE GLUCOSE: NEGATIVE
UROBILINOGEN UA: 0.2 (ref 0.0–1.0)
WBC, UA: NONE SEEN (ref 0–?)
pH: 7.5 (ref 5.0–8.0)

## 2015-07-31 LAB — CBC WITH DIFFERENTIAL/PLATELET
BASOS PCT: 0.6 % (ref 0.0–3.0)
Basophils Absolute: 0 10*3/uL (ref 0.0–0.1)
EOS ABS: 0.1 10*3/uL (ref 0.0–0.7)
Eosinophils Relative: 0.9 % (ref 0.0–5.0)
HCT: 43.1 % (ref 36.0–46.0)
HEMOGLOBIN: 14.4 g/dL (ref 12.0–15.0)
LYMPHS PCT: 17.5 % (ref 12.0–46.0)
Lymphs Abs: 1.2 10*3/uL (ref 0.7–4.0)
MCHC: 33.3 g/dL (ref 30.0–36.0)
MCV: 93.9 fl (ref 78.0–100.0)
Monocytes Absolute: 0.6 10*3/uL (ref 0.1–1.0)
Monocytes Relative: 8.4 % (ref 3.0–12.0)
Neutro Abs: 5.2 10*3/uL (ref 1.4–7.7)
Neutrophils Relative %: 72.6 % (ref 43.0–77.0)
PLATELETS: 316 10*3/uL (ref 150.0–400.0)
RBC: 4.59 Mil/uL (ref 3.87–5.11)
RDW: 13.3 % (ref 11.5–15.5)
WBC: 7.1 10*3/uL (ref 4.0–10.5)

## 2015-07-31 NOTE — Progress Notes (Signed)
Pre visit review using our clinic review tool, if applicable. No additional management support is needed unless otherwise documented below in the visit note. 

## 2015-07-31 NOTE — Patient Instructions (Signed)
   Test(s) ordered today. Your results will be released to MyChart (or called to you) after review, usually within 72hours after test completion. If any changes need to be made, you will be notified at that same time.   Medications reviewed and updated .  No changes recommended at this time.  We will call you with the results and discuss treatment and further evaluation at that time.

## 2015-07-31 NOTE — Progress Notes (Signed)
Subjective:    Patient ID: Robyn MelterMichele Dulak, female    DOB: 08/03/1986, 29 y.o.   MRN: [id number]  HPI Her cold symptoms improved, but she continues to experience constipation.  She states she has difficulty with constipation, but recently it has been worse. Typically she has a bowel movement twice a week. The stool is generally heart. She has tried several natural things to help with constipation, but nothing has helped.  She has tried increasing her water and fiber, as well is taking a stool softener.  Her last bowel movement was 4 days ago. For the past week she has been experiencing intermittent abdominal pain. The pain is in her lower abdomen pain is dull in nature. On occasion she will get a sharper more intense pain..  She denies any blood in the stool. She has had a little nausea doesn't feel well. She has decreased appetite.  She does not feel like she is passing much gas.    She has never had constipation that has lasted this long or been this bad.  She denies a history of ovarian cysts. She has seen her GYN in the last several months and her exam was normal. The  Medications and allergies reviewed with patient and updated if appropriate.  Patient Active Problem List   Diagnosis Date Noted  . Cervical intraepithelial neoplasia grade 2 02/10/2013  . Knee pain, right anterior 01/06/2013  . Depression 11/26/2012    Current Outpatient Prescriptions on File Prior to Visit  Medication Sig Dispense Refill  . FLUoxetine (PROZAC) 40 MG capsule TAKE 1 CAPSULE (40 MG TOTAL) BY MOUTH DAILY. 90 capsule 0  . glucosamine-chondroitin 500-400 MG tablet Take 1 tablet by mouth daily.    . Multiple Vitamins-Minerals (WOMENS MULTIVITAMIN PLUS) TABS Take 1 tablet by mouth daily.     No current facility-administered medications on file prior to visit.    Past Medical History  Diagnosis Date  . Arthritis   . Allergy   . History of chicken pox   . Depression   . Abnormal Pap smear of cervix  2014    Past Surgical History  Procedure Laterality Date  . Cryotherapy  2014    for abnormal pap smear    Social History   Social History  . Marital Status: Single    Spouse Name: N/A  . Number of Children: N/A  . Years of Education: 16+   Occupational History  . Server/Freelance Dancer    Social History Main Topics  . Smoking status: Never Smoker   . Smokeless tobacco: Never Used  . Alcohol Use: 1.8 - 2.4 oz/week    3-4 Standard drinks or equivalent per week  . Drug Use: No  . Sexual Activity:    Partners: Male    Birth Control/ Protection: Condom   Other Topics Concern  . None   Social History Narrative   Caffeine Use-yes   Regular exercise-yes    Review of Systems  Constitutional: Positive for appetite change (decreased). Negative for fever and chills.  Gastrointestinal: Positive for nausea, abdominal pain and constipation. Negative for diarrhea and blood in stool.  Genitourinary: Negative for dysuria, frequency and hematuria.       Objective:   Filed Vitals:   07/31/15 1453  BP: 106/60  Pulse: 96  Temp: 98.9 F (37.2 C)  Resp: 16   Filed Weights   07/31/15 1453  Weight: 207 lb (93.895 kg)   Body mass index is 29.08 kg/(m^2).   Physical Exam  Constitutional: She appears well-developed and well-nourished. No distress.  Abdominal: Soft. Bowel sounds are normal. She exhibits no distension and no mass. There is tenderness (Across lower abdomen, especially right lower quadrant and left lower quadrant). There is no rebound and no guarding.  Musculoskeletal: She exhibits no edema.  Skin: She is not diaphoretic.          Assessment & Plan:   Lower abdominal pain, constipation Possible severe constipation, bowel obstruction, ovarian cyst, much less likely appendicitis Will check CBC, CMP and urine Abdominal x-ray today to rule out obstruction Depending on above results we may need to pursue a CAT scan It looks like she is simply constipated  and enema may help  We will call her later with the results and determine what treatment we will do Advised her that if her pain worsens overnight she should go to the emergency room

## 2015-08-01 LAB — URINE CULTURE
Colony Count: NO GROWTH
Organism ID, Bacteria: NO GROWTH

## 2015-08-17 ENCOUNTER — Ambulatory Visit (INDEPENDENT_AMBULATORY_CARE_PROVIDER_SITE_OTHER): Payer: BLUE CROSS/BLUE SHIELD | Admitting: Psychology

## 2015-08-17 DIAGNOSIS — F332 Major depressive disorder, recurrent severe without psychotic features: Secondary | ICD-10-CM | POA: Diagnosis not present

## 2015-08-31 ENCOUNTER — Ambulatory Visit (INDEPENDENT_AMBULATORY_CARE_PROVIDER_SITE_OTHER): Payer: BLUE CROSS/BLUE SHIELD | Admitting: Psychology

## 2015-08-31 DIAGNOSIS — F332 Major depressive disorder, recurrent severe without psychotic features: Secondary | ICD-10-CM | POA: Diagnosis not present

## 2015-09-07 ENCOUNTER — Ambulatory Visit (INDEPENDENT_AMBULATORY_CARE_PROVIDER_SITE_OTHER): Payer: BLUE CROSS/BLUE SHIELD | Admitting: Psychology

## 2015-09-07 DIAGNOSIS — F332 Major depressive disorder, recurrent severe without psychotic features: Secondary | ICD-10-CM

## 2015-09-14 ENCOUNTER — Ambulatory Visit: Payer: BLUE CROSS/BLUE SHIELD | Admitting: Psychology

## 2015-09-21 ENCOUNTER — Ambulatory Visit (INDEPENDENT_AMBULATORY_CARE_PROVIDER_SITE_OTHER): Payer: BLUE CROSS/BLUE SHIELD | Admitting: Psychology

## 2015-09-21 DIAGNOSIS — F332 Major depressive disorder, recurrent severe without psychotic features: Secondary | ICD-10-CM

## 2015-10-05 ENCOUNTER — Ambulatory Visit (INDEPENDENT_AMBULATORY_CARE_PROVIDER_SITE_OTHER): Payer: BLUE CROSS/BLUE SHIELD | Admitting: Psychology

## 2015-10-05 DIAGNOSIS — F332 Major depressive disorder, recurrent severe without psychotic features: Secondary | ICD-10-CM | POA: Diagnosis not present

## 2015-10-12 ENCOUNTER — Ambulatory Visit (INDEPENDENT_AMBULATORY_CARE_PROVIDER_SITE_OTHER): Payer: BLUE CROSS/BLUE SHIELD | Admitting: Psychology

## 2015-10-12 ENCOUNTER — Ambulatory Visit: Payer: BLUE CROSS/BLUE SHIELD | Admitting: Psychology

## 2015-10-12 DIAGNOSIS — F332 Major depressive disorder, recurrent severe without psychotic features: Secondary | ICD-10-CM | POA: Diagnosis not present

## 2015-10-26 ENCOUNTER — Ambulatory Visit (INDEPENDENT_AMBULATORY_CARE_PROVIDER_SITE_OTHER): Payer: BLUE CROSS/BLUE SHIELD | Admitting: Psychology

## 2015-10-26 DIAGNOSIS — F332 Major depressive disorder, recurrent severe without psychotic features: Secondary | ICD-10-CM

## 2015-11-09 ENCOUNTER — Ambulatory Visit (INDEPENDENT_AMBULATORY_CARE_PROVIDER_SITE_OTHER): Payer: BLUE CROSS/BLUE SHIELD | Admitting: Psychology

## 2015-11-09 DIAGNOSIS — F332 Major depressive disorder, recurrent severe without psychotic features: Secondary | ICD-10-CM

## 2015-11-16 ENCOUNTER — Ambulatory Visit (INDEPENDENT_AMBULATORY_CARE_PROVIDER_SITE_OTHER): Payer: BLUE CROSS/BLUE SHIELD | Admitting: Psychology

## 2015-11-16 DIAGNOSIS — F332 Major depressive disorder, recurrent severe without psychotic features: Secondary | ICD-10-CM

## 2015-11-30 ENCOUNTER — Ambulatory Visit (INDEPENDENT_AMBULATORY_CARE_PROVIDER_SITE_OTHER): Payer: BLUE CROSS/BLUE SHIELD | Admitting: Psychology

## 2015-11-30 DIAGNOSIS — F331 Major depressive disorder, recurrent, moderate: Secondary | ICD-10-CM | POA: Diagnosis not present

## 2015-12-28 ENCOUNTER — Ambulatory Visit (INDEPENDENT_AMBULATORY_CARE_PROVIDER_SITE_OTHER): Payer: BLUE CROSS/BLUE SHIELD | Admitting: Psychology

## 2016-01-11 ENCOUNTER — Ambulatory Visit (INDEPENDENT_AMBULATORY_CARE_PROVIDER_SITE_OTHER): Payer: BLUE CROSS/BLUE SHIELD | Admitting: Psychology

## 2016-01-11 DIAGNOSIS — F332 Major depressive disorder, recurrent severe without psychotic features: Secondary | ICD-10-CM | POA: Diagnosis not present

## 2016-01-25 ENCOUNTER — Ambulatory Visit (INDEPENDENT_AMBULATORY_CARE_PROVIDER_SITE_OTHER): Payer: BLUE CROSS/BLUE SHIELD | Admitting: Psychology

## 2016-01-25 DIAGNOSIS — F332 Major depressive disorder, recurrent severe without psychotic features: Secondary | ICD-10-CM | POA: Diagnosis not present

## 2016-02-01 ENCOUNTER — Ambulatory Visit: Payer: BLUE CROSS/BLUE SHIELD | Admitting: Psychology

## 2016-02-08 ENCOUNTER — Ambulatory Visit (INDEPENDENT_AMBULATORY_CARE_PROVIDER_SITE_OTHER): Payer: BLUE CROSS/BLUE SHIELD | Admitting: Psychology

## 2016-02-08 DIAGNOSIS — F332 Major depressive disorder, recurrent severe without psychotic features: Secondary | ICD-10-CM

## 2016-02-22 ENCOUNTER — Ambulatory Visit (INDEPENDENT_AMBULATORY_CARE_PROVIDER_SITE_OTHER): Payer: BLUE CROSS/BLUE SHIELD | Admitting: Psychology

## 2016-02-22 DIAGNOSIS — F331 Major depressive disorder, recurrent, moderate: Secondary | ICD-10-CM

## 2016-03-07 ENCOUNTER — Ambulatory Visit (INDEPENDENT_AMBULATORY_CARE_PROVIDER_SITE_OTHER): Payer: BLUE CROSS/BLUE SHIELD | Admitting: Psychology

## 2016-03-07 DIAGNOSIS — F331 Major depressive disorder, recurrent, moderate: Secondary | ICD-10-CM | POA: Diagnosis not present

## 2016-03-14 ENCOUNTER — Ambulatory Visit: Payer: BLUE CROSS/BLUE SHIELD | Admitting: Psychology

## 2016-03-21 ENCOUNTER — Ambulatory Visit: Payer: BLUE CROSS/BLUE SHIELD | Admitting: Psychology

## 2016-04-04 ENCOUNTER — Ambulatory Visit (INDEPENDENT_AMBULATORY_CARE_PROVIDER_SITE_OTHER): Payer: BLUE CROSS/BLUE SHIELD | Admitting: Psychology

## 2016-04-04 DIAGNOSIS — F331 Major depressive disorder, recurrent, moderate: Secondary | ICD-10-CM | POA: Diagnosis not present

## 2016-04-08 ENCOUNTER — Encounter: Payer: Self-pay | Admitting: Internal Medicine

## 2016-04-08 ENCOUNTER — Other Ambulatory Visit (INDEPENDENT_AMBULATORY_CARE_PROVIDER_SITE_OTHER): Payer: BLUE CROSS/BLUE SHIELD

## 2016-04-08 ENCOUNTER — Ambulatory Visit (INDEPENDENT_AMBULATORY_CARE_PROVIDER_SITE_OTHER): Payer: BLUE CROSS/BLUE SHIELD | Admitting: Internal Medicine

## 2016-04-08 VITALS — BP 120/84 | HR 85 | Temp 98.6°F | Resp 16 | Wt 200.0 lb

## 2016-04-08 DIAGNOSIS — R1030 Lower abdominal pain, unspecified: Secondary | ICD-10-CM

## 2016-04-08 DIAGNOSIS — K5900 Constipation, unspecified: Secondary | ICD-10-CM | POA: Insufficient documentation

## 2016-04-08 DIAGNOSIS — R1031 Right lower quadrant pain: Secondary | ICD-10-CM | POA: Insufficient documentation

## 2016-04-08 LAB — COMPREHENSIVE METABOLIC PANEL WITH GFR
ALT: 6 U/L (ref 0–35)
AST: 18 U/L (ref 0–37)
Albumin: 4.4 g/dL (ref 3.5–5.2)
Alkaline Phosphatase: 72 U/L (ref 39–117)
BUN: 15 mg/dL (ref 6–23)
CO2: 30 meq/L (ref 19–32)
Calcium: 9.1 mg/dL (ref 8.4–10.5)
Chloride: 102 meq/L (ref 96–112)
Creatinine, Ser: 0.76 mg/dL (ref 0.40–1.20)
GFR: 94.4 mL/min
Glucose, Bld: 92 mg/dL (ref 70–99)
Potassium: 4.1 meq/L (ref 3.5–5.1)
Sodium: 137 meq/L (ref 135–145)
Total Bilirubin: 0.6 mg/dL (ref 0.2–1.2)
Total Protein: 7.4 g/dL (ref 6.0–8.3)

## 2016-04-08 LAB — URINALYSIS, ROUTINE W REFLEX MICROSCOPIC
BILIRUBIN URINE: NEGATIVE
Ketones, ur: NEGATIVE
Leukocytes, UA: NEGATIVE
Nitrite: NEGATIVE
Specific Gravity, Urine: 1.01 (ref 1.000–1.030)
TOTAL PROTEIN, URINE-UPE24: NEGATIVE
Urine Glucose: NEGATIVE
Urobilinogen, UA: 0.2 (ref 0.0–1.0)
WBC, UA: NONE SEEN (ref 0–?)
pH: 6 (ref 5.0–8.0)

## 2016-04-08 LAB — CBC WITH DIFFERENTIAL/PLATELET
Basophils Absolute: 0 K/uL (ref 0.0–0.1)
Basophils Relative: 0.5 % (ref 0.0–3.0)
Eosinophils Absolute: 0.2 K/uL (ref 0.0–0.7)
Eosinophils Relative: 4.1 % (ref 0.0–5.0)
HCT: 38.4 % (ref 36.0–46.0)
Hemoglobin: 13.1 g/dL (ref 12.0–15.0)
Lymphocytes Relative: 26.9 % (ref 12.0–46.0)
Lymphs Abs: 1.3 K/uL (ref 0.7–4.0)
MCHC: 34.2 g/dL (ref 30.0–36.0)
MCV: 92.8 fl (ref 78.0–100.0)
Monocytes Absolute: 0.5 K/uL (ref 0.1–1.0)
Monocytes Relative: 9.1 % (ref 3.0–12.0)
Neutro Abs: 3 K/uL (ref 1.4–7.7)
Neutrophils Relative %: 59.4 % (ref 43.0–77.0)
Platelets: 252 K/uL (ref 150.0–400.0)
RBC: 4.14 Mil/uL (ref 3.87–5.11)
RDW: 12.6 % (ref 11.5–15.5)
WBC: 5 K/uL (ref 4.0–10.5)

## 2016-04-08 LAB — AMYLASE: AMYLASE: 46 U/L (ref 27–131)

## 2016-04-08 LAB — LIPASE: Lipase: 17 U/L (ref 11.0–59.0)

## 2016-04-08 NOTE — Assessment & Plan Note (Signed)
Chronic for several months, possible IBS -D No improvement with trying multiple things Will refer to GI for further evaluation May need to try miralax or linzess, but will need to rule out other causes of abdominal pain

## 2016-04-08 NOTE — Assessment & Plan Note (Signed)
?   Related to ovarian cyst, constipation, appendix or GU issues less likely Check urine and blood work Will obtain ct scan of abd/pelvis to r/o several causes of pain. If ct not approved will get US first and Ct after if needed If pain increases prior to results she will call and let me know

## 2016-04-08 NOTE — Progress Notes (Signed)
Subjective:    Patient ID: Robyn Long, female    DOB: 02/23/1986, 30 y.o.   MRN: [id number]  HPI She is here for an acute visit.   Constipation:  She continues to have intermittent constipation that has been worse over the past few months.  Stress and diet causes variation in her stool.  She has been having pain more consistently in the right lower side.  She feels bloated and has cramping all the time for the past couple of months.  She has small bowel movements every 2-3 days.  She does not get relief of the bloating and cramping after a bowel movement.    Fiber, diet revisions, probiotics and stool softeners have not helped.  She has never tried miralax.  She thinks the constipation started in December 2016 and prior to that it was not an issue.    She has lower abdominal pain.  She has had a few episodes of severe pain in her RLQ in the past month.  It has occurred with activity.  She has had to stop what she has been doing.  She has pain across her lower abdomen that has been more consistent and is getting worse.  She has not felt relief of the pain after having a bowel movement.   She last saw her gyn in April and her exam was normal.  She does follow with her regularly due to her abnormal pap in the past.   Medications and allergies reviewed with patient and updated if appropriate.  Patient Active Problem List   Diagnosis Date Noted  . Cervical intraepithelial neoplasia grade 2 02/10/2013  . Knee pain, right anterior 01/06/2013  . Depression 11/26/2012    Current Outpatient Prescriptions on File Prior to Visit  Medication Sig Dispense Refill  . FLUoxetine (PROZAC) 40 MG capsule TAKE 1 CAPSULE (40 MG TOTAL) BY MOUTH DAILY. 90 capsule 0  . glucosamine-chondroitin 500-400 MG tablet Take 1 tablet by mouth daily.    . Multiple Vitamins-Minerals (WOMENS MULTIVITAMIN PLUS) TABS Take 1 tablet by mouth daily.     No current facility-administered medications on file prior to  visit.     Past Medical History:  Diagnosis Date  . Abnormal Pap smear of cervix 2014  . Allergy   . Arthritis   . Depression   . History of chicken pox     Past Surgical History:  Procedure Laterality Date  . CRYOTHERAPY  2014   for abnormal pap smear    Social History   Social History  . Marital status: Single    Spouse name: N/A  . Number of children: N/A  . Years of education: 16+   Occupational History  . 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 Smoking status: Never Smoker  . Smokeless tobacco: Never Used  . Alcohol use 1.8 - 2.4 oz/week    3 - 4 Standard drinks or equivalent per week  . Drug use: No  . Sexual activity: Yes    Partners: Male    Birth control/ protection: Condom   Other Topics Concern  . Not on file   Social History Narrative   Caffeine Use-yes   Regular exercise-yes    Family History  Problem Relation Age of Onset  . Mitral valve prolapse Father   . Breast cancer Maternal Aunt 40    deceased from breast cancer  . Lung cancer Paternal Grandfather   . Hypertension Mother   . Mitral  valve prolapse Paternal Aunt   . Lung cancer Maternal Grandfather   . Mitral valve prolapse Paternal Aunt     Review of Systems  Constitutional: Negative for chills, fever and unexpected weight change.  Gastrointestinal: Positive for abdominal pain (cramping) and constipation. Negative for blood in stool and nausea.       No gerd  Genitourinary: Positive for frequency. Negative for dysuria and hematuria.       Objective:   Vitals:   04/08/16 0949  BP: 120/84  Pulse: 85  Resp: 16  Temp: 98.6 F (37 C)   Filed Weights   04/08/16 0949  Weight: 200 lb (90.7 kg)   Body mass index is 28.09 kg/m.   Physical Exam  Constitutional: She appears well-developed and well-nourished. No distress.  Abdominal: Soft. She exhibits mass. She exhibits no distension. There is tenderness (across lower abdomen, increased in  RLQ). There is no rebound and no guarding.  Musculoskeletal: She exhibits no edema.  Skin: She is not diaphoretic.          Assessment & Plan:   See Problem List for Assessment and Plan of chronic medical problems.

## 2016-04-08 NOTE — Progress Notes (Signed)
Pre visit review using our clinic review tool, if applicable. No additional management support is needed unless otherwise documented below in the visit note. 

## 2016-04-08 NOTE — Patient Instructions (Signed)
  Test(s) ordered today. Your results will be released to MyChart (or called to you) after review, usually within 72hours after test completion. If any changes need to be made, you will be notified at that same time.   Medications reviewed and updated.  No changes recommended at this time.  A ct scan of your abdomen was ordered.   A referral was ordered for GI for further evaluation of your constipation.

## 2016-04-11 ENCOUNTER — Ambulatory Visit (INDEPENDENT_AMBULATORY_CARE_PROVIDER_SITE_OTHER): Payer: BLUE CROSS/BLUE SHIELD | Admitting: Psychology

## 2016-04-11 ENCOUNTER — Encounter: Payer: Self-pay | Admitting: Internal Medicine

## 2016-04-11 ENCOUNTER — Ambulatory Visit (INDEPENDENT_AMBULATORY_CARE_PROVIDER_SITE_OTHER)
Admission: RE | Admit: 2016-04-11 | Discharge: 2016-04-11 | Disposition: A | Discharge status: Home / Self Care | Payer: BLUE CROSS/BLUE SHIELD | Source: Ambulatory Visit | Attending: Internal Medicine | Admitting: Internal Medicine

## 2016-04-11 DIAGNOSIS — F332 Major depressive disorder, recurrent severe without psychotic features: Secondary | ICD-10-CM | POA: Diagnosis not present

## 2016-04-11 DIAGNOSIS — R1030 Lower abdominal pain, unspecified: Secondary | ICD-10-CM

## 2016-04-11 MED ORDER — IOPAMIDOL (ISOVUE-300) INJECTION 61%
100.0000 mL | Freq: Once | INTRAVENOUS | Status: AC | PRN
  Administered 2016-04-11: 100 mL via INTRAVENOUS

## 2016-04-13 ENCOUNTER — Encounter: Payer: Self-pay | Admitting: Internal Medicine

## 2016-04-24 ENCOUNTER — Encounter: Payer: Self-pay | Admitting: Internal Medicine

## 2016-04-25 ENCOUNTER — Ambulatory Visit: Payer: BLUE CROSS/BLUE SHIELD | Admitting: Psychology

## 2016-04-25 DIAGNOSIS — F331 Major depressive disorder, recurrent, moderate: Secondary | ICD-10-CM

## 2016-05-08 ENCOUNTER — Encounter: Payer: Self-pay | Admitting: Internal Medicine

## 2016-05-08 ENCOUNTER — Ambulatory Visit (INDEPENDENT_AMBULATORY_CARE_PROVIDER_SITE_OTHER): Payer: BLUE CROSS/BLUE SHIELD | Admitting: Internal Medicine

## 2016-05-08 VITALS — BP 110/78 | HR 100 | Ht 71.0 in | Wt 199.5 lb

## 2016-05-08 DIAGNOSIS — R1084 Generalized abdominal pain: Secondary | ICD-10-CM | POA: Diagnosis not present

## 2016-05-08 DIAGNOSIS — K5909 Other constipation: Secondary | ICD-10-CM

## 2016-05-08 MED ORDER — LINACLOTIDE 290 MCG PO CAPS: 290.0000 ug | ORAL_CAPSULE | Freq: Every day | ORAL | 0 refills | Status: DC

## 2016-05-08 NOTE — Progress Notes (Signed)
HISTORY OF PRESENT ILLNESS:  Robyn Long is a 30 y.o. 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 Lawerance BachBurns with chief complaint of constipation, bloating, and abdominal pain. Patient reports developing symptoms almost 1 year ago. She was evaluated. This was attributed to stress. Things improved. However, symptoms recurred in July. Was evaluated by Dr. Lawerance BachBurns in late August. Laboratories including CBC and comprehensive metabolic panel were normal. Contrast-enhanced CT scan of the abdomen and pelvis performed 04/11/2016 was unremarkable except for moderate stool throughout the colon. For her problems she has tried over-the-counter agents without improvement. Laxatives result in cramping without relief of constipation. MiraLAX once daily helps slightly. She denies weight loss, bleeding, new medications, nonprescription items, or change in diet. She has had severe lower abdominal pain at times that keeps her from completing exercise. There is no family history of colon cancer.  REVIEW OF SYSTEMS:  All non-GI ROS negative except for depression, fatigue, menstrual cramps  Past Medical History:  Diagnosis Date  . Abnormal Pap smear of cervix 2014  . Allergy   . Arthritis   . Depression   . History of chicken pox     Past Surgical History:  Procedure Laterality Date  . CRYOTHERAPY  2014   for abnormal pap smear    Social History Robyn Long  reports that she has never smoked. She has never used smokeless tobacco. She reports that she drinks about 1.8 - 2.4 oz of alcohol per week . She reports that she does not use drugs.  family history includes Breast cancer (age of onset: 5340) in her maternal aunt; Colon polyps in her mother; Hypertension in her mother; Lung cancer in her maternal grandfather and paternal grandfather; Mitral valve prolapse in her father, paternal aunt, and paternal aunt.  No Known Allergies     PHYSICAL  EXAMINATION: Vital signs: BP 110/78   Pulse 100   Ht 5\' 11"  (1.803 m)   Wt 199 lb 8 oz (90.5 kg)   LMP 03/27/2016   BMI 27.82 kg/m   Constitutional: generally well-appearing, no acute distress Psychiatric: alert and oriented x3, cooperative Eyes: extraocular movements intact, anicteric, conjunctiva pink Mouth: oral pharynx moist, no lesions Neck: supple no lymphadenopathy Cardiovascular: heart regular rate and rhythm, no murmur Lungs: clear to auscultation bilaterally Abdomen: soft, nontender, nondistended, no obvious ascites, no peritoneal signs, normal bowel sounds, no organomegaly Rectal:Omitted Extremities: no clubbing cyanosis or lower extremity edema bilaterally Skin: no lesions on visible extremities Neuro: No focal deficits. No asterixis.    ASSESSMENT:  #1. Constipation with associated bloating and abdominal discomfort. May have an element of constipation predominant irritable bowel syndrome. No alarm features. Negative workup as outlined   PLAN:  #1. Discussed colonoscopy. Will hold off at this time per patient preference #2. MiraLAX trial. Titrate to achieve desired effect #3. If MiraLAX not effective then linzess 290 g once daily. Samples have been provided as well as coupon. Should she try linzess and finding helpful, she knows to contact the office such that we may prescribed medication. Discussed potential side effects with her today #4. Routine office follow-up 3 months   A copy of this consultation note has been sent to Dr. Lawerance BachBurns

## 2016-05-08 NOTE — Patient Instructions (Signed)
Titrate Miralax to treat your constipation.  If this does not work, you may try Linzess 290mcg, 1 capsule 30 minutes before the first meal of the day.  If this is successful, call us back and we sent in a prescription

## 2016-05-09 ENCOUNTER — Ambulatory Visit (INDEPENDENT_AMBULATORY_CARE_PROVIDER_SITE_OTHER): Payer: BLUE CROSS/BLUE SHIELD | Admitting: Psychology

## 2016-05-09 DIAGNOSIS — F331 Major depressive disorder, recurrent, moderate: Secondary | ICD-10-CM | POA: Diagnosis not present

## 2016-05-16 ENCOUNTER — Ambulatory Visit: Payer: BLUE CROSS/BLUE SHIELD | Admitting: Psychology

## 2016-05-23 ENCOUNTER — Ambulatory Visit: Payer: Self-pay | Admitting: Psychology

## 2016-06-06 ENCOUNTER — Ambulatory Visit (INDEPENDENT_AMBULATORY_CARE_PROVIDER_SITE_OTHER): Payer: BLUE CROSS/BLUE SHIELD | Admitting: Psychology

## 2016-06-06 DIAGNOSIS — F332 Major depressive disorder, recurrent severe without psychotic features: Secondary | ICD-10-CM

## 2016-06-13 ENCOUNTER — Ambulatory Visit: Payer: BLUE CROSS/BLUE SHIELD | Admitting: Psychology

## 2016-06-20 ENCOUNTER — Ambulatory Visit (INDEPENDENT_AMBULATORY_CARE_PROVIDER_SITE_OTHER): Payer: BLUE CROSS/BLUE SHIELD | Admitting: Psychology

## 2016-06-20 DIAGNOSIS — F331 Major depressive disorder, recurrent, moderate: Secondary | ICD-10-CM | POA: Diagnosis not present

## 2016-06-27 ENCOUNTER — Ambulatory Visit: Payer: BLUE CROSS/BLUE SHIELD | Admitting: Psychology

## 2016-07-11 ENCOUNTER — Ambulatory Visit: Payer: BLUE CROSS/BLUE SHIELD | Admitting: Psychology

## 2016-07-28 ENCOUNTER — Ambulatory Visit (INDEPENDENT_AMBULATORY_CARE_PROVIDER_SITE_OTHER): Payer: BLUE CROSS/BLUE SHIELD | Admitting: Psychology

## 2016-07-28 DIAGNOSIS — F332 Major depressive disorder, recurrent severe without psychotic features: Secondary | ICD-10-CM

## 2016-08-08 ENCOUNTER — Ambulatory Visit: Payer: BLUE CROSS/BLUE SHIELD | Admitting: Psychology

## 2016-08-12 ENCOUNTER — Telehealth: Payer: Self-pay | Admitting: *Deleted

## 2016-08-12 NOTE — Telephone Encounter (Signed)
Patient in 08 recall. Please contact patient and schedule AEX Robyn Long/PAP

## 2016-08-13 NOTE — Telephone Encounter (Signed)
Left message for patient to call to schedule aex due to previous abnormal history.

## 2016-08-15 ENCOUNTER — Ambulatory Visit (INDEPENDENT_AMBULATORY_CARE_PROVIDER_SITE_OTHER): Payer: BLUE CROSS/BLUE SHIELD | Admitting: Psychology

## 2016-08-15 DIAGNOSIS — F332 Major depressive disorder, recurrent severe without psychotic features: Secondary | ICD-10-CM

## 2016-08-15 NOTE — Telephone Encounter (Signed)
Left message for patient to call & schedule aex. 

## 2016-08-15 NOTE — Telephone Encounter (Signed)
No call back from patient

## 2016-08-18 NOTE — Telephone Encounter (Signed)
Patient has been contacted twice regarding 08 recall. Please advise on letter/recall status Thanks

## 2016-08-18 NOTE — Telephone Encounter (Signed)
She is in recall because of hx of cryo to her cervix without hx of neg pap and neg HR HPV x 2.  She did have a negative pap and neg HR HPV 1 year ago so ok to remove from recall.  Thanks.

## 2016-08-19 NOTE — Telephone Encounter (Signed)
Patient removed from recall -eh  

## 2016-08-22 ENCOUNTER — Ambulatory Visit: Payer: BLUE CROSS/BLUE SHIELD | Admitting: Psychology

## 2016-08-29 ENCOUNTER — Ambulatory Visit (INDEPENDENT_AMBULATORY_CARE_PROVIDER_SITE_OTHER): Payer: BLUE CROSS/BLUE SHIELD | Admitting: Psychology

## 2016-08-29 DIAGNOSIS — F332 Major depressive disorder, recurrent severe without psychotic features: Secondary | ICD-10-CM | POA: Diagnosis not present

## 2016-09-12 ENCOUNTER — Ambulatory Visit (INDEPENDENT_AMBULATORY_CARE_PROVIDER_SITE_OTHER): Payer: BLUE CROSS/BLUE SHIELD | Admitting: Psychology

## 2016-09-12 DIAGNOSIS — F332 Major depressive disorder, recurrent severe without psychotic features: Secondary | ICD-10-CM | POA: Diagnosis not present

## 2016-09-19 ENCOUNTER — Ambulatory Visit: Payer: BLUE CROSS/BLUE SHIELD | Admitting: Psychology

## 2016-09-26 ENCOUNTER — Ambulatory Visit (INDEPENDENT_AMBULATORY_CARE_PROVIDER_SITE_OTHER): Payer: BLUE CROSS/BLUE SHIELD | Admitting: Psychology

## 2016-09-26 DIAGNOSIS — F332 Major depressive disorder, recurrent severe without psychotic features: Secondary | ICD-10-CM

## 2016-10-03 ENCOUNTER — Ambulatory Visit: Payer: BLUE CROSS/BLUE SHIELD | Admitting: Psychology

## 2016-10-10 ENCOUNTER — Ambulatory Visit: Payer: BLUE CROSS/BLUE SHIELD | Admitting: Psychology

## 2016-11-05 ENCOUNTER — Ambulatory Visit (INDEPENDENT_AMBULATORY_CARE_PROVIDER_SITE_OTHER): Payer: BLUE CROSS/BLUE SHIELD | Admitting: Psychology

## 2016-11-05 DIAGNOSIS — F332 Major depressive disorder, recurrent severe without psychotic features: Secondary | ICD-10-CM

## 2016-11-13 ENCOUNTER — Ambulatory Visit (INDEPENDENT_AMBULATORY_CARE_PROVIDER_SITE_OTHER): Payer: BLUE CROSS/BLUE SHIELD | Admitting: Certified Nurse Midwife

## 2016-11-13 ENCOUNTER — Encounter: Payer: Self-pay | Admitting: Certified Nurse Midwife

## 2016-11-13 VITALS — BP 110/70 | HR 70 | Resp 16 | Ht 70.75 in | Wt 198.0 lb

## 2016-11-13 DIAGNOSIS — Z01419 Encounter for gynecological examination (general) (routine) without abnormal findings: Secondary | ICD-10-CM | POA: Diagnosis not present

## 2016-11-13 DIAGNOSIS — Z87898 Personal history of other specified conditions: Secondary | ICD-10-CM

## 2016-11-13 DIAGNOSIS — Z124 Encounter for screening for malignant neoplasm of cervix: Secondary | ICD-10-CM

## 2016-11-13 DIAGNOSIS — Z8742 Personal history of other diseases of the female genital tract: Secondary | ICD-10-CM

## 2016-11-13 NOTE — Progress Notes (Signed)
31 y.o. G0P0000 Married Caucasian Fe here for annual exam.  Periods normal, no issues. Contraception condoms working well. Sees PCP if needed. No health issues today.   Patient's last menstrual period was 10/24/2016 (exact date).          Sexually active: Yes.    The current method of family planning is condoms all the time.    Exercising: Yes.    yoga, running & swimming Smoker:  no  Health Maintenance: Pap:  2015 neg, cryo 2014, 07-18-15 neg HPV HR neg MMG:  none Colonoscopy:  none BMD:   none TDaP:  2010 Shingles: no Pneumonia: no Hep C and HIV: not done Labs: pcp Self breast exam: not done   reports that she has never smoked. She has never used smokeless tobacco. She reports that she drinks about 1.8 - 2.4 oz of alcohol per week . She reports that she does not use drugs.  Past Medical History:  Diagnosis Date  . Abnormal Pap smear of cervix 2014  . Allergy   . Arthritis   . Depression   . History of chicken pox     Past Surgical History:  Procedure Laterality Date  . CRYOTHERAPY  2014   for abnormal pap smear    Current Outpatient Prescriptions  Medication Sig Dispense Refill  . glucosamine-chondroitin 500-400 MG tablet Take 1 tablet by mouth daily.    . Multiple Vitamins-Minerals (WOMENS MULTIVITAMIN PLUS) TABS Take 1 tablet by mouth daily.     No current facility-administered medications for this visit.     Family History  Problem Relation Age of Onset  . Mitral valve prolapse Father   . Hypertension Mother   . Colon polyps Mother   . Breast cancer Maternal Aunt 40    deceased from breast cancer  . Lung cancer Paternal Grandfather   . Mitral valve prolapse Paternal Aunt   . Lung cancer Maternal Grandfather   . Mitral valve prolapse Paternal Aunt   . Colon cancer Neg Hx     ROS:  Pertinent items are noted in HPI.  Otherwise, a comprehensive ROS was negative.  Exam:   BP 110/70   Pulse 70   Resp 16   Ht 5' 10.75" (1.797 m)   Wt 198 lb (89.8 kg)    LMP 10/24/2016 (Exact Date)   BMI 27.81 kg/m  Height: 5' 10.75" (179.7 cm) Ht Readings from Last 3 Encounters:  11/13/16 5' 10.75" (1.797 m)  05/08/16  (1.803 m)  07/18/15 5' 10.75" (1.797 m)    General appearance: alert, cooperative and appears stated age Head: Normocephalic, without obvious abnormality, atraumatic Neck: no adenopathy, supple, symmetrical, trachea midline and thyroid normal to inspection and palpation Lungs: clear to auscultation bilaterally Breasts: normal appearance, no masses or tenderness, No nipple retraction or dimpling, No nipple discharge or bleeding, No axillary or supraclavicular adenopathy Heart: regular rate and rhythm Abdomen: soft, non-tender; no masses,  no organomegaly Extremities: extremities normal, atraumatic, no cyanosis or edema Skin: Skin color, texture, turgor normal. No rashes or lesions Lymph nodes: Cervical, supraclavicular, and axillary nodes normal. No abnormal inguinal nodes palpated Neurologic: Grossly normal   Pelvic: External genitalia:  no lesions              Urethra:  normal appearing urethra with no masses, tenderness or lesions              Bartholin's and Skene's: normal  Vagina: normal appearing vagina with normal color and discharge, no lesions              Cervix: no bleeding following Pap, no cervical motion tenderness and no lesions              Pap taken: Yes.   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 Questions addressed.  Pap smear as above   counseled on breast self exam, adequate intake of calcium and vitamin D, diet and exercise  return annually or prn  An After Visit Summary was printed and given to the patient.

## 2016-11-13 NOTE — Patient Instructions (Signed)

## 2016-11-17 LAB — IPS PAP TEST WITH HPV

## 2016-11-19 NOTE — Progress Notes (Signed)
Encounter reviewed Robyn Arns, MD   

## 2016-11-21 ENCOUNTER — Ambulatory Visit (INDEPENDENT_AMBULATORY_CARE_PROVIDER_SITE_OTHER): Payer: BLUE CROSS/BLUE SHIELD | Admitting: Psychology

## 2016-11-21 DIAGNOSIS — F332 Major depressive disorder, recurrent severe without psychotic features: Secondary | ICD-10-CM | POA: Diagnosis not present

## 2016-11-28 ENCOUNTER — Ambulatory Visit: Payer: BLUE CROSS/BLUE SHIELD | Admitting: Psychology

## 2016-12-03 ENCOUNTER — Ambulatory Visit (INDEPENDENT_AMBULATORY_CARE_PROVIDER_SITE_OTHER): Payer: BLUE CROSS/BLUE SHIELD | Admitting: Psychology

## 2016-12-03 DIAGNOSIS — F332 Major depressive disorder, recurrent severe without psychotic features: Secondary | ICD-10-CM | POA: Diagnosis not present

## 2016-12-19 ENCOUNTER — Ambulatory Visit (INDEPENDENT_AMBULATORY_CARE_PROVIDER_SITE_OTHER): Payer: BLUE CROSS/BLUE SHIELD | Admitting: Psychology

## 2016-12-19 DIAGNOSIS — F332 Major depressive disorder, recurrent severe without psychotic features: Secondary | ICD-10-CM

## 2016-12-26 ENCOUNTER — Ambulatory Visit: Payer: BLUE CROSS/BLUE SHIELD | Admitting: Psychology

## 2017-01-09 ENCOUNTER — Ambulatory Visit (INDEPENDENT_AMBULATORY_CARE_PROVIDER_SITE_OTHER): Payer: BLUE CROSS/BLUE SHIELD | Admitting: Psychology

## 2017-01-09 DIAGNOSIS — F332 Major depressive disorder, recurrent severe without psychotic features: Secondary | ICD-10-CM

## 2017-01-16 ENCOUNTER — Ambulatory Visit: Payer: BLUE CROSS/BLUE SHIELD | Admitting: Psychology

## 2017-01-30 ENCOUNTER — Ambulatory Visit (INDEPENDENT_AMBULATORY_CARE_PROVIDER_SITE_OTHER): Payer: BLUE CROSS/BLUE SHIELD | Admitting: Psychology

## 2017-01-30 DIAGNOSIS — F332 Major depressive disorder, recurrent severe without psychotic features: Secondary | ICD-10-CM | POA: Diagnosis not present

## 2017-02-06 ENCOUNTER — Ambulatory Visit: Payer: BLUE CROSS/BLUE SHIELD | Admitting: Psychology

## 2017-02-20 ENCOUNTER — Ambulatory Visit (INDEPENDENT_AMBULATORY_CARE_PROVIDER_SITE_OTHER): Payer: BLUE CROSS/BLUE SHIELD | Admitting: Psychology

## 2017-02-20 DIAGNOSIS — F332 Major depressive disorder, recurrent severe without psychotic features: Secondary | ICD-10-CM | POA: Diagnosis not present

## 2017-02-27 ENCOUNTER — Ambulatory Visit: Payer: BLUE CROSS/BLUE SHIELD | Admitting: Psychology

## 2017-03-20 ENCOUNTER — Ambulatory Visit (INDEPENDENT_AMBULATORY_CARE_PROVIDER_SITE_OTHER): Payer: 59 | Admitting: Psychology

## 2017-03-20 DIAGNOSIS — F332 Major depressive disorder, recurrent severe without psychotic features: Secondary | ICD-10-CM

## 2017-03-23 ENCOUNTER — Encounter: Payer: Self-pay | Admitting: Family Medicine

## 2017-03-23 ENCOUNTER — Other Ambulatory Visit (INDEPENDENT_AMBULATORY_CARE_PROVIDER_SITE_OTHER): Payer: 59

## 2017-03-23 ENCOUNTER — Ambulatory Visit (INDEPENDENT_AMBULATORY_CARE_PROVIDER_SITE_OTHER): Payer: 59 | Admitting: Family Medicine

## 2017-03-23 VITALS — BP 130/78 | HR 81 | Temp 98.0°F | Ht 70.75 in | Wt 196.0 lb

## 2017-03-23 DIAGNOSIS — R1030 Lower abdominal pain, unspecified: Secondary | ICD-10-CM | POA: Diagnosis not present

## 2017-03-23 DIAGNOSIS — M25551 Pain in right hip: Secondary | ICD-10-CM

## 2017-03-23 LAB — CBC WITH DIFFERENTIAL/PLATELET
BASOS PCT: 0.4 % (ref 0.0–3.0)
Basophils Absolute: 0 10*3/uL (ref 0.0–0.1)
EOS ABS: 0.1 10*3/uL (ref 0.0–0.7)
EOS PCT: 2.1 % (ref 0.0–5.0)
HEMATOCRIT: 41.7 % (ref 36.0–46.0)
HEMOGLOBIN: 13.8 g/dL (ref 12.0–15.0)
LYMPHS PCT: 26.3 % (ref 12.0–46.0)
Lymphs Abs: 1.3 10*3/uL (ref 0.7–4.0)
MCHC: 33.2 g/dL (ref 30.0–36.0)
MCV: 95.3 fl (ref 78.0–100.0)
MONO ABS: 0.4 10*3/uL (ref 0.1–1.0)
Monocytes Relative: 8.3 % (ref 3.0–12.0)
NEUTROS ABS: 3.1 10*3/uL (ref 1.4–7.7)
Neutrophils Relative %: 62.9 % (ref 43.0–77.0)
PLATELETS: 264 10*3/uL (ref 150.0–400.0)
RBC: 4.37 Mil/uL (ref 3.87–5.11)
RDW: 12.9 % (ref 11.5–15.5)
WBC: 4.9 10*3/uL (ref 4.0–10.5)

## 2017-03-23 NOTE — Assessment & Plan Note (Signed)
Pain is possible to be related to origin at ASIS or AIIS. Could be rectus femoris tendinopathy vs iliopsoas tendionpathy. Could also be a labral tear. Less likely for a stress fracture  - advised to f/u for US exam - consider a pelvic xray   - could consider MRI of pelvic to evaluate the deeper structures. Would need a 3T MRI to evaluate the labrum of the joint.

## 2017-03-23 NOTE — Progress Notes (Signed)
Robyn Long - 31 y.o. female MRN [id number]  Date of birth: 08/03/1986  SUBJECTIVE:  Including CC & ROS.  Chief Complaint  Patient presents with  . Hip Pain    pain in right hip pelvig region that radiated to low back and naval area.     Ms. Olena HecklerumbleIs a 31 year old female is presenting with right hip pain and right lower quadrant abdominal pain. She was seen by Dr. Lawerance BachBurns and September 2017 with similar symptoms. Imaging and lab work at that time was normal. She did have some constipation. She reports she was also been a few days ago that did the things that Dr. Lawerance BachBurns suggested and had resolution of that problem. She reports the pain originates in her right anterior hip and travels to her bellybutton. The pain is mild and staying the same. His acute on chronic. She says that it started roughly a year ago. Does not seem to be associated with her bowel movements, voiding, or movement. It seems to gotten worse the past 2 months. She was running more frequently but has not been able to the past 2 months due to this pain.     Review of Systems  Gastrointestinal: Positive for abdominal pain and constipation. Negative for abdominal distention and nausea.  Musculoskeletal: Positive for myalgias. Negative for arthralgias and gait problem.  Skin: Negative for rash.    HISTORY: Past Medical, Surgical, Social, and Family History Reviewed & Updated per EMR.   Pertinent Historical Findings include:  Past Medical History:  Diagnosis Date  . Abnormal Pap smear of cervix 2014  . Allergy   . Arthritis   . Depression   . History of chicken pox     Past Surgical History:  Procedure Laterality Date  . CRYOTHERAPY  2014   for abnormal pap smear    No Known Allergies  Family History  Problem Relation Age of Onset  . Mitral valve prolapse Father   . Hypertension Mother   . Colon polyps Mother   . Breast cancer Maternal Aunt 40       deceased from breast cancer  . Lung cancer Paternal  Grandfather   . Mitral valve prolapse Paternal Aunt   . Lung cancer Maternal Grandfather   . Mitral valve prolapse Paternal Aunt   . Colon cancer Neg Hx      Social History   Social History  . Marital status: Married    Spouse name: N/A  . Number of children: N/A  . Years of education: 16+   Occupational History  . 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 Smoking status: Never Smoker  . Smokeless tobacco: Never Used  . Alcohol use 1.8 - 2.4 oz/week    3 - 4 Standard drinks or equivalent per week  . Drug use: No  . Sexual activity: Yes    Partners: Male    Birth control/ protection: Condom   Other Topics Concern  . Not on file   Social History Narrative   Caffeine Use-yes   Regular exercise-yes     PHYSICAL EXAM:  VS: BP 130/78 (BP Location: Left Arm, Patient Position: Sitting, Cuff Size: Normal)   Pulse 81   Temp 98 F (36.7 C) (Oral)   Ht 5' 10.75" (1.797 m)   Wt 196 lb (88.9 kg)   SpO2 99%   BMI 27.53 kg/m  Physical Exam Gen: NAD, alert, cooperative with exam, well-appearing ENT: normal lips, normal nasal mucosa,  Eye: normal EOM,  normal conjunctiva and lids CV:  no edema, +2 pedal pulses   Resp: no accessory muscle use, non-labored,  GI: no masses, no hernia, Negative McBurney's point, some TTP overlying of the pelvic/RLQ, soft, normal bowel sounds  Skin: no rashes, no areas of induration  Neuro: normal tone, normal sensation to touch Psych:  normal insight, alert and oriented MSK:  Right HIP:  TTP of the ASIS and AIIS  Pain with iliopsoas testing  Normal hip flexion  Normal hip IR and ER  Normal strength in LE  No TTP of the GT  Neurovascularly intact       ASSESSMENT & PLAN:   Lower abdominal pain This pain seems to be similar to what she was seen for last time. Imaging and labwork at that time was normal. Possible for appendix vs ovarian. No hernia. Possible for MSK in origin.  - cbc w/ diff. Checking for  infection secondary to pancreatitis  - if not MSK then would consider vaginal [place] checking for ovarian origin   Right hip pain Pain is possible to be related to origin at ASIS or AIIS. Could be rectus femoris tendinopathy vs iliopsoas tendionpathy. Could also be a labral tear. Less likely for a stress fracture  - advised to f/u for US exam - consider a pelvic xray   - could consider MRI of pelvic to evaluate the deeper structures. Would need a 3T MRI to evaluate the labrum of the joint.

## 2017-03-23 NOTE — Assessment & Plan Note (Signed)
This pain seems to be similar to what she was seen for last time. Imaging and labwork at that time was normal. Possible for appendix vs ovarian. No hernia. Possible for MSK in origin.  - cbc w/ diff. Checking for infection secondary to pancreatitis  - if not MSK then would consider vaginal US checking for ovarian origin

## 2017-03-23 NOTE — Patient Instructions (Addendum)
Thank you for coming in,   Please follow-up with me on Friday afternoon so we can do an ultrasound of your hip. Please good answers get blood work and we will call with results.   Please feel free to call with any questions or concerns at any time, at [phone]. --Dr. Jordan LikesSchmitz

## 2017-03-27 ENCOUNTER — Encounter: Payer: Self-pay | Admitting: Family Medicine

## 2017-03-27 ENCOUNTER — Ambulatory Visit: Payer: BLUE CROSS/BLUE SHIELD | Admitting: Psychology

## 2017-03-27 ENCOUNTER — Ambulatory Visit (INDEPENDENT_AMBULATORY_CARE_PROVIDER_SITE_OTHER): Payer: 59 | Admitting: Family Medicine

## 2017-03-27 VITALS — BP 110/68 | HR 97 | Temp 98.7°F

## 2017-03-27 DIAGNOSIS — M25551 Pain in right hip: Secondary | ICD-10-CM

## 2017-03-27 NOTE — Progress Notes (Addendum)
  Robyn Long - 31 y.o. female MRN [id number]  Date of birth: 15-Aug-1985  SUBJECTIVE:  Including CC & ROS.  No chief complaint on file.   [name] is a 31 year old female that is following up in order to have an ultrasound scan performed     Review of Systems  HISTORY: Past Medical, Surgical, Social, and Family History Reviewed & Updated per EMR.   Pertinent Historical Findings include:  Past Medical History:  Diagnosis Date  . Abnormal Pap smear of cervix 2014  . Allergy   . Arthritis   . Depression   . History of chicken pox     Past Surgical History:  Procedure Laterality Date  . CRYOTHERAPY  2014   for abnormal pap smear    No Known Allergies  Family History  Problem Relation Age of Onset  . Mitral valve prolapse Father   . Hypertension Mother   . Colon polyps Mother   . Breast cancer Maternal Aunt 40       deceased from breast cancer  . Lung cancer Paternal Grandfather   . Mitral valve prolapse Paternal Aunt   . Lung cancer Maternal Grandfather   . Mitral valve prolapse Paternal Aunt   . Colon cancer Neg Hx      Social History   Social History  . Marital status: Married    Spouse name: N/A  . Number of children: N/A  . Years of education: 16+   Occupational History  . 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 Smoking status: Never Smoker  . Smokeless tobacco: Never Used  . Alcohol use 1.8 - 2.4 oz/week    3 - 4 Standard drinks or equivalent per week  . Drug use: No  . Sexual activity: Yes    Partners: Male    Birth control/ protection: Condom   Other Topics Concern  . Not on file   Social History Narrative   Caffeine Use-yes   Regular exercise-yes     PHYSICAL EXAM:  VS: BP 110/68   Pulse 97   Temp 98.7 F (37.1 C)   SpO2 99%  Physical Exam Gen: NAD, alert, cooperative with exam, well-appearing MSK:  Right hip: Tenderness to palpation over the anterior of the right hip. This area seems  consistent with the anterior inferior iliac spine versus the pelvic rim.    Limited ultrasound: Right hip:  Normal appearing anterior superior iliac spine and anterior inferior iliac spine Normal femoral neurovascular bundle Normal-appearing iliopsoas tendon. No joint effusion in the hip. Normal-appearing adductor longus  Summary: Normal exam  Ultrasound and interpretation by Clare Gandy, MD           ASSESSMENT & PLAN:   Right hip pain It appears that her symptoms are more suggestive of a sports hernia. There is no overt findings on the ultrasound exam performed today. This seems more musculoskeletal in nature versus GYN as there is pain to palpation and movement. Her previous lab tests showed no white count so less likely for a appendicitis occurring. - Advised to focus therapy on that for a sports hernia - Provided duexis - If no improvement in 3-4 weeks can consider referral for pelvic ultrasound. - May need an MRI if no improvement and nothing found with prior investigations.

## 2017-03-27 NOTE — Assessment & Plan Note (Addendum)
It appears that her symptoms are more suggestive of a sports hernia. There is no overt findings on the ultrasound exam performed today. This seems more musculoskeletal in nature versus GYN as there is pain to palpation and movement. Her previous lab tests showed no white count so less likely for a appendicitis occurring. - Advised to focus therapy on that for a sports hernia - Provided duexis - If no improvement in 3-4 weeks can consider referral for pelvic ultrasound. - May need an MRI if no improvement and nothing found with prior investigations.

## 2017-03-27 NOTE — Patient Instructions (Signed)
Thank you for coming in,   Please see if Jonny Ruiz can treat you for a sports hernia. You can take anti-inflammatories for this pain. Please follow up with me in 3-4 weeks if there is no improvement.    Please feel free to call with any questions or concerns at any time, at [phone]. --Dr. Jordan Likes

## 2017-04-06 ENCOUNTER — Other Ambulatory Visit (INDEPENDENT_AMBULATORY_CARE_PROVIDER_SITE_OTHER): Payer: 59

## 2017-04-06 ENCOUNTER — Encounter: Payer: Self-pay | Admitting: Internal Medicine

## 2017-04-06 ENCOUNTER — Ambulatory Visit (INDEPENDENT_AMBULATORY_CARE_PROVIDER_SITE_OTHER): Payer: 59 | Admitting: Internal Medicine

## 2017-04-06 VITALS — BP 134/80 | HR 120 | Temp 98.4°F | Resp 16 | Wt 196.0 lb

## 2017-04-06 DIAGNOSIS — M25551 Pain in right hip: Secondary | ICD-10-CM | POA: Diagnosis not present

## 2017-04-06 DIAGNOSIS — R1031 Right lower quadrant pain: Secondary | ICD-10-CM

## 2017-04-06 LAB — COMPREHENSIVE METABOLIC PANEL
ALT: 7 U/L (ref 0–35)
AST: 16 U/L (ref 0–37)
Albumin: 4.8 g/dL (ref 3.5–5.2)
Alkaline Phosphatase: 70 U/L (ref 39–117)
BILIRUBIN TOTAL: 0.4 mg/dL (ref 0.2–1.2)
BUN: 13 mg/dL (ref 6–23)
CO2: 30 meq/L (ref 19–32)
CREATININE: 0.82 mg/dL (ref 0.40–1.20)
Calcium: 9.5 mg/dL (ref 8.4–10.5)
Chloride: 102 mEq/L (ref 96–112)
GFR: 85.92 mL/min (ref >60.00)
GLUCOSE: 95 mg/dL (ref 70–99)
Potassium: 3.7 mEq/L (ref 3.5–5.1)
SODIUM: 138 meq/L (ref 135–145)
Total Protein: 7.9 g/dL (ref 6.0–8.3)

## 2017-04-06 LAB — URINALYSIS, ROUTINE W REFLEX MICROSCOPIC
Bilirubin Urine: NEGATIVE
KETONES UR: NEGATIVE
LEUKOCYTES UA: NEGATIVE
Nitrite: NEGATIVE
SPECIFIC GRAVITY, URINE: 1.015 (ref 1.000–1.030)
Total Protein, Urine: NEGATIVE
Urine Glucose: NEGATIVE
Urobilinogen, UA: 0.2 (ref 0.0–1.0)
WBC, UA: NONE SEEN (ref 0–?)
pH: 6 (ref 5.0–8.0)

## 2017-04-06 NOTE — Assessment & Plan Note (Signed)
Pain for one month-getting worse Pain is located in the right lower quadrant and radiating towards the right flank Need to rule out kidney stone, UTI, ovarian cyst or constipation Discussed getting an ultrasound of her kidneys and bladder along with a pelvic/transvaginal ultrasound versus a CT scan. She wants to check on the price and what is covered with her insurance-she will let me know and we can decide imaging based on   Check CMP, urinalysis, urine culture. CBC done recently was normal

## 2017-04-06 NOTE — Patient Instructions (Signed)
Right lower quadrant pain - we can get a pelvic and transvaginal ultrasound with a kidney / bladder ultrasound or a ct scan of your abdomen/pelvis

## 2017-04-06 NOTE — Progress Notes (Signed)
Subjective:    Patient ID: Robyn Long, female    DOB: [birth year], 31 y.o.   MRN: [id number]  HPI The patient is here for follow up.  One year ago she had constipation and that was causing abdominal pain . She did end up seeing gastroenterology and that was resolved. She still has some mild constipation, but overall feels well controlled.  3 months ago she started having right hip pain. She revised her activities, but the pain persisted. She did develop Achilles tendinitis and has been working with physical therapy. He is trying to help with the hip pain is slightly. She was still experiencing hip pain and she saw sports medicine twice. The pain was kind of from the right lower back toward the hip and into her hip flexor region. Approximately 1 month ago she started experiencing pain more focused in the right lower quadrant. This pain feels like something different. It is getting worse. She feels the pain is constant but varies in intensity. It radiates slightly toward her right flank and sometimes feels like cramping or a stitch feeling it is not worse with movement or any activity. It is not affected by eating or having a bowel movement.  She has difficulty finding a comfortable position to sleep in, but feels that may be related more to her hip and her right lower quadrant abdominal pain. She has uncomfortableness all the time. She thinks the pain may be worse around the time of her menses, but she is not positive. She thinks actually feels better after doing yoga or doing a dance class. It tends to tighten up after an hour of finishing activity. She has difficulty discerning the right hip pain from right lower quadrant pain.  For the hip pain she has been taking some anti-inflammatories that seemed to help a little, but are not affecting the right lower quadrant pain. Her menses are regular. She has had a GYN exam earlier this year, which was normal. She was not experiencing the right lower  quadrant pain at that time.   Medications and allergies reviewed with patient and updated if appropriate.  Patient Active Problem List   Diagnosis Date Noted  . Right hip pain 03/23/2017  . Lower abdominal pain 04/08/2016  . Constipation 04/08/2016  . Cervical intraepithelial neoplasia grade 2 02/10/2013  . Knee pain, right anterior 01/06/2013  . Depression 11/26/2012    Current Outpatient Prescriptions on File Prior to Visit  Medication Sig Dispense Refill  . glucosamine-chondroitin 500-400 MG tablet Take 1 tablet by mouth daily.    . Multiple Vitamins-Minerals (WOMENS MULTIVITAMIN PLUS) TABS Take 1 tablet by mouth daily.     No current facility-administered medications on file prior to visit.     Past Medical History:  Diagnosis Date  . Abnormal Pap smear of cervix 2014  . Allergy   . Arthritis   . Depression   . History of chicken pox     Past Surgical History:  Procedure Laterality Date  . CRYOTHERAPY  2014   for abnormal pap smear    Social History   Social History  . Marital status: Married    Spouse name: N/A  . Number of children: N/A  . Years of education: 16+   Occupational History  . 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 Smoking status: Never Smoker  . Smokeless tobacco: Never Used  . Alcohol use 1.8 - 2.4 oz/week    3 - 4  Standard drinks or equivalent per week  . Drug use: No  . Sexual activity: Yes    Partners: Male    Birth control/ protection: Condom   Other Topics Concern  . Not on file   Social History Narrative   Caffeine Use-yes   Regular exercise-yes    Family History  Problem Relation Age of Onset  . Mitral valve prolapse Father   . Hypertension Mother   . Colon polyps Mother   . Breast cancer Maternal Aunt 40       deceased from breast cancer  . Lung cancer Paternal Grandfather   . Mitral valve prolapse Paternal Aunt   . Lung cancer Maternal Grandfather   . Mitral valve prolapse  Paternal Aunt   . Colon cancer Neg Hx     Review of Systems  Constitutional: Negative for chills and fever.  Gastrointestinal: Positive for abdominal pain, constipation (mild, controlled) and nausea. Negative for blood in stool and diarrhea.       GERD 2-3 times a week at night  Genitourinary: Negative for dysuria, frequency and hematuria.  Neurological: Negative for light-headedness, numbness and headaches.       Objective:   Vitals:   04/06/17 1444  BP: 134/80  Pulse: (!) 120  Resp: 16  Temp: 98.4 F (36.9 C)  SpO2: 97%   Wt Readings from Last 3 Encounters:  04/06/17 196 lb (88.9 kg)  03/23/17 196 lb (88.9 kg)  11/13/16 198 lb (89.8 kg)   Body mass index is 27.53 kg/m.   Physical Exam    Constitutional: Appears well-developed and well-nourished. No distress.  Abdomen: soft, tenderness  in the right lower quadrant and right mid quadrant without rebound or guarding, no mass Musculoskeletal: Tenderness and right SI joint, along right iliac bone / ASIS and in right groin  Skin: Skin is warm and dry. Not diaphoretic.  Psychiatric: Normal mood and affect. Behavior is normal.    Assessment & Plan:    See Problem List for Assessment and Plan of chronic medical problems.

## 2017-04-06 NOTE — Assessment & Plan Note (Signed)
Review Dr. Venetia Night is working with physical therapy now She does not want skin MRI since this covered by her insurance We'll continue anti-inflammatories and physical therapy for now Consider follow up with sports medicine if no improvement since this seems separate from her right lower quadrant pain

## 2017-04-07 ENCOUNTER — Telehealth: Payer: Self-pay | Admitting: Internal Medicine

## 2017-04-07 DIAGNOSIS — R10A1 Flank pain, right side: Secondary | ICD-10-CM

## 2017-04-07 DIAGNOSIS — R109 Unspecified abdominal pain: Secondary | ICD-10-CM

## 2017-04-07 DIAGNOSIS — R1031 Right lower quadrant pain: Secondary | ICD-10-CM

## 2017-04-07 LAB — URINE CULTURE: ORGANISM ID, BACTERIA: NO GROWTH

## 2017-04-07 NOTE — Telephone Encounter (Signed)
Pt called to let Dr Lawerance Bach know that she checked with her insurance and she would like to have the two ultrasounds done rather than the CT Scan.   From 04/07/17 Note: "Need to rule out kidney stone, UTI, ovarian cyst or constipation. Discussed getting an ultrasound of her kidneys and bladder along with a pelvic/transvaginal ultrasound versus a CT scan. She wants to check on the price and what is covered with her insurance-she will let me know and we can decide imaging based on"

## 2017-04-07 NOTE — Telephone Encounter (Signed)
Ultrasounds ordered - someone will call her to schedule

## 2017-04-08 NOTE — Telephone Encounter (Signed)
LVM informing pt

## 2017-04-09 ENCOUNTER — Encounter: Payer: Self-pay | Admitting: Internal Medicine

## 2017-04-09 ENCOUNTER — Ambulatory Visit
Admission: RE | Admit: 2017-04-09 | Discharge: 2017-04-09 | Disposition: A | Discharge status: Home / Self Care | Payer: 59 | Source: Ambulatory Visit | Attending: Internal Medicine | Admitting: Internal Medicine

## 2017-04-09 DIAGNOSIS — R1031 Right lower quadrant pain: Secondary | ICD-10-CM

## 2017-04-09 DIAGNOSIS — R102 Pelvic and perineal pain: Secondary | ICD-10-CM

## 2017-04-10 ENCOUNTER — Ambulatory Visit
Admission: RE | Admit: 2017-04-10 | Discharge: 2017-04-10 | Disposition: A | Discharge status: Home / Self Care | Payer: 59 | Source: Ambulatory Visit | Attending: Internal Medicine | Admitting: Internal Medicine

## 2017-04-10 DIAGNOSIS — R1031 Right lower quadrant pain: Secondary | ICD-10-CM

## 2017-04-10 DIAGNOSIS — R109 Unspecified abdominal pain: Secondary | ICD-10-CM

## 2017-04-17 ENCOUNTER — Ambulatory Visit (INDEPENDENT_AMBULATORY_CARE_PROVIDER_SITE_OTHER): Payer: 59 | Admitting: Psychology

## 2017-04-17 DIAGNOSIS — F332 Major depressive disorder, recurrent severe without psychotic features: Secondary | ICD-10-CM | POA: Diagnosis not present

## 2017-04-24 ENCOUNTER — Ambulatory Visit: Payer: 59 | Admitting: Psychology

## 2017-04-30 ENCOUNTER — Ambulatory Visit (INDEPENDENT_AMBULATORY_CARE_PROVIDER_SITE_OTHER): Payer: 59 | Admitting: Psychology

## 2017-04-30 DIAGNOSIS — F332 Major depressive disorder, recurrent severe without psychotic features: Secondary | ICD-10-CM

## 2017-05-01 ENCOUNTER — Ambulatory Visit: Payer: Self-pay | Admitting: Psychology

## 2017-05-08 ENCOUNTER — Ambulatory Visit: Payer: Self-pay | Admitting: Psychology

## 2017-05-15 ENCOUNTER — Ambulatory Visit: Payer: Self-pay | Admitting: Psychology

## 2017-05-22 ENCOUNTER — Ambulatory Visit (INDEPENDENT_AMBULATORY_CARE_PROVIDER_SITE_OTHER): Payer: 59 | Admitting: Psychology

## 2017-05-22 DIAGNOSIS — F332 Major depressive disorder, recurrent severe without psychotic features: Secondary | ICD-10-CM | POA: Diagnosis not present

## 2017-05-29 ENCOUNTER — Ambulatory Visit: Payer: Self-pay | Admitting: Psychology

## 2017-06-01 ENCOUNTER — Ambulatory Visit (INDEPENDENT_AMBULATORY_CARE_PROVIDER_SITE_OTHER): Payer: 59 | Admitting: Psychology

## 2017-06-01 DIAGNOSIS — F332 Major depressive disorder, recurrent severe without psychotic features: Secondary | ICD-10-CM

## 2017-06-05 ENCOUNTER — Ambulatory Visit: Payer: Self-pay | Admitting: Psychology

## 2017-06-12 ENCOUNTER — Ambulatory Visit: Payer: Self-pay | Admitting: Psychology

## 2017-06-17 NOTE — Progress Notes (Signed)
Tawana ScaleZach Smith D.O. [facility] [address] [place], [place] [address] Phone: [phone] Subjective:    I'm seeing this patient by the request  of:  Pincus SanesBurns, Stacy J, MD    CC: Shoulder pain, right  OZH:[id number]:Subjective  Robyn MelterMichele Long is a 31 y.o. female coming in with complaint of right shoulder pain for one year. The intensity has increased this past month. Her pain started near the thoracic spine and began as tingling. She is now having pain in the front of her chest near the bicep tendon. Her pain radiates into the elbow. She teaches yoga and dance. Weight bearing on and swimming also bothers her shoulder.  Rates the severity of pain is 5 out of 10.  Patient is try to be active but finding it difficult.      Past Medical History:  Diagnosis Date  . Abnormal Pap smear of cervix 2014  . Allergy   . Arthritis   . Depression   . History of chicken pox    Past Surgical History:  Procedure Laterality Date  . CRYOTHERAPY  2014   for abnormal pap smear   Social History   Socioeconomic History  . Marital status: Married    Spouse name: Not on file  . Number of children: Not on file  . Years of education: 16+  . Highest education level: Not on file  Social Needs  . Financial resource strain: Not on file  . Food insecurity - worry: Not on file  . Food insecurity - inability: Not on file  . Transportation needs - medical: Not on file  . Transportation needs - non-medical: Not on file  Occupational History  . Occupation: Associate Professorerver/Freelance Dancer    Employer: [employer]  Tobacco Use  . Smoking status: Never Smoker  . Smokeless tobacco: Never Used  Substance and Sexual Activity  . Alcohol use: Yes    Alcohol/week: 1.8 - 2.4 oz    Types: 3 - 4 Standard drinks or equivalent per week  . Drug use: No  . Sexual activity: Yes    Partners: Male    Birth control/protection: Condom  Other Topics Concern  . Not on file  Social History Narrative   Caffeine Use-yes   Regular exercise-yes   No Known Allergies Family History  Problem Relation Age of Onset  . Mitral valve prolapse Father   . Hypertension Mother   . Colon polyps Mother   . Breast cancer Maternal Aunt 40       deceased from breast cancer  . Lung cancer Paternal Grandfather   . Mitral valve prolapse Paternal Aunt   . Lung cancer Maternal Grandfather   . Mitral valve prolapse Paternal Aunt   . Colon cancer Neg Hx      Past medical history, social, surgical and family history all reviewed in electronic medical record.  No pertanent information unless stated regarding to the chief complaint.   Review of Systems:Review of systems updated and as accurate as of 06/17/17  No headache, visual changes, nausea, vomiting, diarrhea, constipation, dizziness, abdominal pain, skin rash, fevers, chills, night sweats, weight loss, swollen lymph nodes, body aches, joint swelling, muscle aches, chest pain, shortness of breath, mood changes.   Objective  There were no vitals taken for this visit. Systems examined below as of 06/17/17   General: No apparent distress alert and oriented x3 mood and affect normal, dressed appropriately.  HEENT: Pupils equal, extraocular movements intact  Respiratory: Patient's speak in full sentences  and does not appear short of breath  Cardiovascular: No lower extremity edema, non tender, no erythema  Skin: Warm dry intact with no signs of infection or rash on extremities or on axial skeleton.  Abdomen: Soft nontender  Neuro: Cranial nerves II through XII are intact, neurovascularly intact in all extremities with 2+ DTRs and 2+ pulses.  Lymph: No lymphadenopathy of posterior or anterior cervical chain or axillae bilaterally.  Gait normal with good balance and coordination.  MSK:  Non tender with full range of motion and good stability and symmetric strength and tone of  elbows, wrist, hip, knee and ankles bilaterally.  Shoulder: Right Inspection reveals no  abnormalities, atrophy or asymmetry. Palpation is normal with no tenderness over AC joint or bicipital groove. ROM is full in all planes passively. Rotator cuff strength normal throughout. signs of impingement with positive Neer and Hawkin's tests, but negative empty can sign. Speeds and Yergason's tests normal. No labral pathology noted with negative Obrien's, negative clunk and good stability. Normal scapular function observed. No painful arc and no drop arm sign. No apprehension sign  MSK US performed of: Right This study was ordered, performed, and interpreted by Terrilee FilesZach Smith D.O.  Shoulder:   Supraspinatus:  Appears normal on Long and transverse views, Bursal bulge seen with shoulder abduction on impingement view. Infraspinatus:  Appears normal on Long and transverse views. Significant increase in Doppler flow Subscapularis:  Appears normal on Long and transverse views. Positive bursa Teres Minor:  Appears normal on Long and transverse views. AC joint:  Capsule undistended, no geyser sign. Glenohumeral Joint:  Appears normal without effusion. Glenoid Labrum:  Intact without visualized tears. Biceps Tendon: Hypoechoic changes noted.  Impression: Subacromial bursitis  97110; 15 additional minutes spent for Therapeutic exercises as stated in above notes.  This included exercises focusing on stretching, strengthening, with significant focus on eccentric aspects.   Long term goals include an improvement in range of motion, strength, endurance as well as avoiding reinjury. Patient's frequency would include in 1-2 times a day, 3-5 times a week for a duration of 6-12 weeks. Shoulder Exercises that included:  Basic scapular stabilization to include adduction and depression of scapula Scaption, focusing on proper movement and good control Internal and External rotation utilizing a theraband, with elbow tucked at side entire time Rows with theraband which was given   Proper technique shown and  discussed handout in great detail with ATC.  All questions were discussed and answered.     Impression and Recommendations:     This case required medical decision making of moderate complexity.      Note: This dictation was prepared with Dragon dictation along with smaller phrase technology. Any transcriptional errors that result from this process are unintentional.

## 2017-06-18 ENCOUNTER — Encounter: Payer: Self-pay | Admitting: Family Medicine

## 2017-06-18 ENCOUNTER — Ambulatory Visit: Payer: Self-pay

## 2017-06-18 ENCOUNTER — Ambulatory Visit: Payer: 59 | Admitting: Family Medicine

## 2017-06-18 VITALS — BP 122/88 | HR 102 | Ht 71.0 in | Wt 198.0 lb

## 2017-06-18 DIAGNOSIS — M7551 Bursitis of right shoulder: Secondary | ICD-10-CM

## 2017-06-18 DIAGNOSIS — G8929 Other chronic pain: Secondary | ICD-10-CM

## 2017-06-18 DIAGNOSIS — G2589 Other specified extrapyramidal and movement disorders: Secondary | ICD-10-CM

## 2017-06-18 DIAGNOSIS — M25511 Pain in right shoulder: Secondary | ICD-10-CM | POA: Diagnosis not present

## 2017-06-18 MED ORDER — DICLOFENAC SODIUM 2 % TD SOLN: 2.0000 "application " | Freq: Two times a day (BID) | TRANSDERMAL | 3 refills | Status: DC

## 2017-06-18 NOTE — Patient Instructions (Signed)
Good to see you  You have a little bicep subluxation and mild subacromial bursitis.  Ice is your friend. Ice 20 minutes 2 times daily. Usually after activity and before bed. Exercises 3 times a week.  pennsaid pinkie amount topically 2 times daily as needed.   Over the counter  Vitamin D 2000IU daily  Turmeric 500mg  twice daily Arm compression sleeve with activity only   See me again in 4 weeks

## 2017-06-18 NOTE — Assessment & Plan Note (Signed)
Shoulder bursitis.  Like to try conservative therapy including home exercise, posture, ergonomics.  If worsening symptoms we will consider possible injection.  Topical anti-inflammatories prescribed.  Possible formal physical therapy may be needed in the future as well.

## 2017-06-18 NOTE — Assessment & Plan Note (Signed)
Poor posture.  Starting from exercise.  Worsening symptoms consider osteopathic manipulation or formal physical therapy

## 2017-06-19 ENCOUNTER — Ambulatory Visit: Payer: Self-pay | Admitting: Psychology

## 2017-06-22 ENCOUNTER — Encounter: Payer: Self-pay | Admitting: Family Medicine

## 2017-06-26 ENCOUNTER — Ambulatory Visit: Payer: 59 | Admitting: Psychology

## 2017-06-26 DIAGNOSIS — F332 Major depressive disorder, recurrent severe without psychotic features: Secondary | ICD-10-CM | POA: Diagnosis not present

## 2017-06-30 ENCOUNTER — Other Ambulatory Visit: Payer: Self-pay | Admitting: Chiropractic Medicine

## 2017-06-30 ENCOUNTER — Ambulatory Visit
Admission: RE | Admit: 2017-06-30 | Discharge: 2017-06-30 | Disposition: A | Discharge status: Home / Self Care | Payer: 59 | Source: Ambulatory Visit | Attending: Chiropractic Medicine | Admitting: Chiropractic Medicine

## 2017-06-30 DIAGNOSIS — M25551 Pain in right hip: Secondary | ICD-10-CM

## 2017-06-30 DIAGNOSIS — M545 Low back pain: Secondary | ICD-10-CM

## 2017-07-01 ENCOUNTER — Encounter: Payer: Self-pay | Admitting: Family Medicine

## 2017-07-01 ENCOUNTER — Ambulatory Visit: Payer: 59 | Admitting: Family Medicine

## 2017-07-01 DIAGNOSIS — G2589 Other specified extrapyramidal and movement disorders: Secondary | ICD-10-CM

## 2017-07-01 DIAGNOSIS — M999 Biomechanical lesion, unspecified: Secondary | ICD-10-CM | POA: Diagnosis not present

## 2017-07-01 DIAGNOSIS — R1031 Right lower quadrant pain: Secondary | ICD-10-CM

## 2017-07-01 MED ORDER — NITROGLYCERIN 0.2 MG/HR TD PT24: MEDICATED_PATCH | TRANSDERMAL | 1 refills | Status: DC

## 2017-07-01 NOTE — Patient Instructions (Signed)
Good to see you.  Continue the meds Avoid the sit ups Biking would be great  S[employer]co orthotics "total support" online would be great  Nitroglycerin Protocol   Apply 1/4 nitroglycerin patch to affected area daily.  Change position of patch within the affected area every 24 hours.  You may experience a headache during the first 1-2 weeks of using the patch, these should subside.  If you experience headaches after beginning nitroglycerin patch treatment, you may take your preferred over the counter pain reliever.  Another side effect of the nitroglycerin patch is skin irritation or rash related to patch adhesive.  Please notify our office if you develop more severe headaches or rash, and stop the patch.  Tendon healing with nitroglycerin patch may require 12 to 24 weeks depending on the extent of injury.  Men should not use if taking Viagra, Cialis, or Levitra.   Do not use if you have migraines or rosacea.  See me again in 3-4 weeks!

## 2017-07-01 NOTE — Assessment & Plan Note (Signed)
Decision today to treat with OMT was based on Physical Exam  After verbal consent patient was treated with HVLA, ME, FPR techniques in cervical, thoracic, rib, lumbar and sacral areas  Patient tolerated the procedure well with improvement in symptoms  Patient given exercises, stretches and lifestyle modifications  See medications in patient instructions if given  Patient will follow up in 4 weeks 

## 2017-07-01 NOTE — Progress Notes (Signed)
Tawana ScaleZach Latashia Koch D.O. [facility] [address] [place], [place] [address] Phone: [phone] Subjective:    I'm seeing this patient by the request  of:  Pincus SanesBurns, Stacy J, MD   CC: right hip pain   WGN:[id number]:Subjective  Robyn Long is a 31 y.o. female coming in with complaint of right hip pain. She has had the pain for almost 1 year. She notes most of her pain with rotation of the hip. She said that her leg gives way with weight bearing on occasion and her physical therapist told her that she should get it checked out. When this happens she has anterior hip and groin pain and her right SI will also have pain at the same time. 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 Physical therapy has helped but is not getting to what she believes is the root of her problems.   Her shoulder is doing better she states.  States that is about 50% better.  Still having some discomfort.  Did respond very well to osteopathic manipulation for the posture and ergonomics.      Past Medical History:  Diagnosis Date  . Abnormal Pap smear of cervix 2014  . Allergy   . Arthritis   . Depression   . History of chicken pox    Past Surgical History:  Procedure Laterality Date  . CRYOTHERAPY  2014   for abnormal pap smear   Social History   Socioeconomic History  . Marital status: Married    Spouse name: None  . Number of children: None  . Years of education: 16+  . Highest education level: None  Social Needs  . Financial resource strain: None  . Food insecurity - worry: None  . Food insecurity - inability: None  . Transportation needs - medical: None  . Transportation needs - non-medical: None  Occupational History  . Occupation: Associate Professorerver/Freelance Dancer    Employer: [employer]  Tobacco Use  . Smoking status: Never Smoker  . Smokeless tobacco: Never Used  Substance and Sexual Activity  . Alcohol use: Yes    Alcohol/week: 1.8 - 2.4 oz    Types: 3 - 4 Standard  drinks or equivalent per week  . Drug use: No  . Sexual activity: Yes    Partners: Male    Birth control/protection: Condom  Other Topics Concern  . None  Social History Narrative   Caffeine Use-yes   Regular exercise-yes   No Known Allergies Family History  Problem Relation Age of Onset  . Mitral valve prolapse Father   . Hypertension Mother   . Colon polyps Mother   . Breast cancer Maternal Aunt 40       deceased from breast cancer  . Lung cancer Paternal Grandfather   . Mitral valve prolapse Paternal Aunt   . Lung cancer Maternal Grandfather   . Mitral valve prolapse Paternal Aunt   . Colon cancer Neg Hx      Past medical history, social, surgical and family history all reviewed in electronic medical record.  No pertanent information unless stated regarding to the chief complaint.   Review of Systems:Review of systems updated and as accurate as of 07/01/17  No headache, visual changes, nausea, vomiting, diarrhea, constipation, dizziness, abdominal pain, skin rash, fevers, chills, night sweats, weight loss, swollen lymph nodes, body aches, joint swelling, muscle aches, chest pain, shortness of breath, mood changes.  Positive muscle aches  Objective  Blood pressure 124/82, pulse (!) 108,  height 5\' 11"  (1.803 m), weight 199 lb (90.3 kg), SpO2 98 %. Systems examined below as of 07/01/17   General: No apparent distress alert and oriented x3 mood and affect normal, dressed appropriately.  HEENT: Pupils equal, extraocular movements intact  Respiratory: Patient's speak in full sentences and does not appear short of breath  Cardiovascular: No lower extremity edema, non tender, no erythema  Skin: Warm dry intact with no signs of infection or rash on extremities or on axial skeleton.  Abdomen: Soft tender in the right lower quadrant and the pubic bone area no rebound tenderness Neuro: Cranial nerves II through XII are intact, neurovascularly intact in all extremities with 2+ DTRs  and 2+ pulses.  Lymph: No lymphadenopathy of posterior or anterior cervical chain or axillae bilaterally.  Gait normal with good balance and coordination.  MSK:  Non tender with full range of motion and good stability and symmetric strength and tone of shoulders, elbows, wrist,  knee and ankles bilaterally.  Patient continues to have tightness around the right scapula.  Mild scapular dyskinesia.  Neck shows some mild loss of lordosis.  Tightness with trigger points in the right shoulder region.  Right hip exam shows full range of motion.  Negative Faber test.  Patient does have tender to palpation more in the lower abdominal, pelvic area.  Seems to be on the right side.  No hernia present.  No abdominal wall defect noted.  Osteopathic findings C2 flexed rotated and side bent right C4 flexed rotated and side bent left C6 flexed rotated and side bent left T3 extended rotated and side bent right inhaled third rib T9 extended rotated and side bent left L2 flexed rotated and side bent right Sacrum right on right    Impression and Recommendations:     This case required medical decision making of moderate complexity.      Note: This dictation was prepared with Dragon dictation along with smaller phrase technology. Any transcriptional errors that result from this process are unintentional.

## 2017-07-01 NOTE — Assessment & Plan Note (Signed)
Patient's pain seems to be more right lower quadrant abdominal pain.  Patient has had workup including pelvic ultrasound that was fairly unremarkable.  This was reviewed by me.  Patient's possibility of seen another provider could also be secondary to a sports hernia.  Started on the nitroglycerin.  Warned of potential side effects.  Patient will be increasing activity slowly over the course the next several days.  Started to avoid certain activities.  We discussed that this can take some time to heal appropriately.  Follow-up again in 4 weeks.

## 2017-07-01 NOTE — Assessment & Plan Note (Signed)
Continues to have some difficulty.  Does have a little bursitis in the right shoulder.  We will hold on any type of injection again.  Will consider at follow-up.  Patient will follow up with me again 4 weeks.

## 2017-07-03 ENCOUNTER — Ambulatory Visit: Payer: Self-pay | Admitting: Psychology

## 2017-07-10 ENCOUNTER — Ambulatory Visit: Payer: 59 | Admitting: Psychology

## 2017-07-10 DIAGNOSIS — F332 Major depressive disorder, recurrent severe without psychotic features: Secondary | ICD-10-CM | POA: Diagnosis not present

## 2017-07-14 ENCOUNTER — Ambulatory Visit: Payer: Self-pay

## 2017-07-14 ENCOUNTER — Ambulatory Visit: Payer: 59 | Admitting: Family Medicine

## 2017-07-14 ENCOUNTER — Telehealth: Payer: Self-pay | Admitting: Emergency Medicine

## 2017-07-14 ENCOUNTER — Encounter: Payer: Self-pay | Admitting: Family Medicine

## 2017-07-14 VITALS — BP 120/78 | HR 103 | Temp 98.1°F | Wt 195.7 lb

## 2017-07-14 DIAGNOSIS — R1031 Right lower quadrant pain: Secondary | ICD-10-CM | POA: Diagnosis not present

## 2017-07-14 NOTE — Progress Notes (Signed)
Subjective:    Patient ID: Robyn MelterMichele Long, female    DOB: 09/19/1985, 31 y.o.   MRN: [id number]  No chief complaint on file.   HPI Patient was seen today for abdominal pain.  Pt endorses right-sided pain since Sunday.  Patient describes pain as a constant light pain, 3-4/10 with pulses pain 6/10 that randomly happen.  Patient states this is different from her typical right hip pain for which she is followed by sports med.  Patient was using a nitroglycerin patch for her right hip pain, but stopped as she thought it may be contributing to her abdominal pain.  Patient denies nausea, vomiting, fever, chills.  Patient unsure if an a/w pain and eating food.  Drinking 60-80 oz of water daily.  Pt still has her appendix.  Patient endorses cough times 1 month, one loose stool this a.m.  In the past patient has had issues with RLQ abdominal pain.  Had a trans-vaginal ultrasound 04/09/17 which was negative. LMP was last wk (end of Nov. 26-30).  Past Medical History:  Diagnosis Date  . Abnormal Pap smear of cervix 2014  . Allergy   . Arthritis   . Depression   . History of chicken pox     No Known Allergies  ROS General: Denies fever, chills, night sweats, changes in weight, changes in appetite HEENT: Denies headaches, ear pain, changes in vision, rhinorrhea, sore throat CV: Denies CP, palpitations, SOB, orthopnea Pulm: Denies SOB, cough, wheezing GI: Denies nausea, vomiting, diarrhea, constipation  + right-sided abdominal pain, loose stool x1 GU: Denies dysuria, hematuria, frequency, vaginal discharge Msk: Denies muscle cramps, joint pains  +R hip pain Neuro: Denies weakness, numbness, tingling Skin: Denies rashes, bruising Psych: Denies depression, anxiety, hallucinations     Objective:    Blood pressure 120/78, pulse (!) 103, temperature 98.1 F (36.7 C), temperature source Oral, weight 195 lb 11.2 oz (88.8 kg).   Gen. Pleasant, well-nourished, in no distress, normal affect  HEENT:  Gunter/AT, face symmetric, no scleral icterus, PERRLA, nares patent without drainage Lungs: no accessory muscle use, CTAB, no wheezes or rales Cardiovascular: RRR, no m/r/g, no peripheral edema Abdomen: BS present, soft, mildly TTP in inferior RUQ and superior RLQ.  ND, no hepatosplenomegaly.  No TTP at McBurney's point, negative Obturator sign. Musculoskeletal: No deformities, no cyanosis or clubbing, normal tone Neuro:  A&Ox3, CN II-XII intact, normal gait Skin:  Warm, no lesions/ rash   Wt Readings from Last 3 Encounters:  07/14/17 195 lb 11.2 oz (88.8 kg)  07/01/17 199 lb (90.3 kg)  06/18/17 198 lb (89.8 kg)    Lab Results  Component Value Date   WBC 4.9 03/23/2017   HGB 13.8 03/23/2017   HCT 41.7 03/23/2017   PLT 264.0 03/23/2017   GLUCOSE 95 04/06/2017   CHOL 147 11/26/2012   TRIG 64.0 11/26/2012   HDL 50.60 11/26/2012   LDLCALC 84 11/26/2012   ALT 7 04/06/2017   AST 16 04/06/2017   NA 138 04/06/2017   K 3.7 04/06/2017   CL 102 04/06/2017   CREATININE 0.82 04/06/2017   BUN 13 04/06/2017   CO2 30 04/06/2017   TSH 1.07 06/11/2015   HGBA1C 5.4 11/26/2012    Assessment/Plan:  Right lower quadrant abdominal pain  -Discussed various causes right upper and lower quadrant abdominal pain including appendicitis, ovarian cyst, colitis, cholecystitis, muscle strain, renal calculi, etc. -Tachycardia noted on exam but no red flags present. -Pt given option to have further eval at ED, but declines at  this time. -We will proceed with lab work.  Patient may need imaging in the future. -Patient given RTC/ED precautions -Follow-up with PCP in the next few days to week. -Okay to take ibuprofen or Tylenol as needed for pain/discomfort.  Okay to use heat pack - Plan: CBC with Differential/Platelet, Comprehensive metabolic panel

## 2017-07-14 NOTE — Telephone Encounter (Signed)
Phone call from pt. with c/o ongoing abdominal pain that has worsened over past 3 days. (stated "since Sunday, 12/2") Admits that it is difficult to describe, but c/o a "cramping" type of pain in RLQ of abdomen and intermittently radiates up into the (R) lower rib cage.  Rated pain at 4/10 with intermittent increased intensity of 6-7/10.  Denied fever.  Reported one episode of loose stool today. Reported loss of appetite.  Denied nausea or vomiting.  Stated the pain has become constant, and has tenderness of Right LQ of abdomen.  Per protocol, pt. Should be seen within 4 hrs.  App[facility]. [facility] at 4:45 PM.  Care Advice per protocol.  Pt. verb. Understanding.  Agrees with plan.      Reason for Disposition . [1] MILD-MODERATE pain AND [2] constant AND [3] present > 2 hours  Answer Assessment - Initial Assessment Questions 1. LOCATION: "Where does it hurt?"      Pain is in the right lower quad. of  abdomen 2. RADIATION: "Does the pain shoot anywhere else?" (e.g., chest, back)     Occasional radiation up to right lower rib cage 3. ONSET: "When did the pain begin?" (e.g., minutes, hours or days ago)      Worsening of pain on Sunday 12/2 4. SUDDEN: "Gradual or sudden onset?"     Sudden change from being positional to constant in right LQ abd.  5. PATTERN "Does the pain come and go, or is it constant?"    - If constant: "Is it getting better, staying the same, or worsening?"      (Note: Constant means the pain never goes away completely; most serious pain is constant and it progresses)     - If intermittent: "How long does it last?" "Do you have pain now?"     (Note: Intermittent means the pain goes away completely between bouts)     Right hip pain is intermittent; unchanged. 6. SEVERITY: "How bad is the pain?"  (e.g., Scale 1-10; mild, moderate, or severe)   - MILD (1-3): doesn't interfere with normal activities, abdomen soft and not tender to touch    - MODERATE (4-7): interferes with  normal activities or awakens from sleep, tender to touch    - SEVERE (8-10): excruciating pain, doubled over, unable to do any normal activities      Moderate with rating 4/10; intermittent increase in intensity to 6-7/10  7. RECURRENT SYMPTOM: "Have you ever had this type of abdominal pain before?" If so, ask: "When was the last time?" and "What happened that time?"      Infreq. Intermittent abd pain; never really addressed. 8. CAUSE: "What do you think is causing the abdominal pain?"    Unknown 9. RELIEVING/AGGRAVATING FACTORS: "What makes it better or worse?" (e.g., movement, antacids, bowel movement)     Noted some increased pain with bowel movement 10. OTHER SYMPTOMS: "Has there been any vomiting, diarrhea, constipation, or urine problems?"       Decrease in appetite.  Some diarrhea today.  Denies fever.  Denies urinary problems  11. PREGNANCY: "Is there any chance you are pregnant?" "When was your last menstrual period?"       No ; LMP one week ago  Protocols used: [facility]

## 2017-07-14 NOTE — Telephone Encounter (Signed)
Spoke with patient and per Dr. Salomon FickBanks because patient only has a 15 min appointment at the end of the day she suggested that she go to the ED instead because patient is going to need imaging done. Explained to patient that by the time that she leaves her appointment the imaging office will be closed and patient will have to wait until tomorrow. Patient understood and would rather not go to the ED for price reasons and would like to come in and see Dr. Salomon FickBanks. Patient states that she does not mind waiting until tomorrow for imaging to be done. Asked patient what was her pain on a scale to 1-10 and patient states that it was a 4, no vomiting, but patient does have diarrhea. [facility] with Dr. Salomon FickBanks at 4:45pm.

## 2017-07-14 NOTE — Patient Instructions (Addendum)

## 2017-07-15 ENCOUNTER — Telehealth: Payer: Self-pay | Admitting: Internal Medicine

## 2017-07-15 LAB — COMPREHENSIVE METABOLIC PANEL
ALK PHOS: 66 U/L (ref 39–117)
ALT: 7 U/L (ref 0–35)
AST: 17 U/L (ref 0–37)
Albumin: 4.9 g/dL (ref 3.5–5.2)
BUN: 12 mg/dL (ref 6–23)
CHLORIDE: 101 meq/L (ref 96–112)
CO2: 27 meq/L (ref 19–32)
Calcium: 9.5 mg/dL (ref 8.4–10.5)
Creatinine, Ser: 0.78 mg/dL (ref 0.40–1.20)
GFR: 90.87 mL/min (ref >60.00)
GLUCOSE: 90 mg/dL (ref 70–99)
POTASSIUM: 3.8 meq/L (ref 3.5–5.1)
Sodium: 138 mEq/L (ref 135–145)
Total Bilirubin: 0.7 mg/dL (ref 0.2–1.2)
Total Protein: 7.5 g/dL (ref 6.0–8.3)

## 2017-07-15 LAB — CBC WITH DIFFERENTIAL/PLATELET
Basophils Absolute: 0 10*3/uL (ref 0.0–0.1)
Basophils Relative: 0.4 % (ref 0.0–3.0)
Eosinophils Absolute: 0.1 10*3/uL (ref 0.0–0.7)
Eosinophils Relative: 0.9 % (ref 0.0–5.0)
HCT: 40.6 % (ref 36.0–46.0)
Hemoglobin: 13.5 g/dL (ref 12.0–15.0)
LYMPHS ABS: 1.6 10*3/uL (ref 0.7–4.0)
Lymphocytes Relative: 24.5 % (ref 12.0–46.0)
MCHC: 33.2 g/dL (ref 30.0–36.0)
MCV: 98.1 fl (ref 78.0–100.0)
MONOS PCT: 7.4 % (ref 3.0–12.0)
Monocytes Absolute: 0.5 10*3/uL (ref 0.1–1.0)
NEUTROS ABS: 4.4 10*3/uL (ref 1.4–7.7)
NEUTROS PCT: 66.8 % (ref 43.0–77.0)
PLATELETS: 304 10*3/uL (ref 150.0–400.0)
RBC: 4.14 Mil/uL (ref 3.87–5.11)
RDW: 13.1 % (ref 11.5–15.5)
WBC: 6.6 10*3/uL (ref 4.0–10.5)

## 2017-07-15 NOTE — Telephone Encounter (Signed)
Copied from CRM [id number]. Topic: Quick Communication - Office Called Patient >> Jul 15, 2017  2:05 PM Guinevere FerrariMorris, Mataya Kilduff E, NT wrote: Reason for CRM: Pt. Called about lab results. Pt. Would like a call back

## 2017-07-15 NOTE — Telephone Encounter (Signed)
Spoke with patient regarding lab results. Nothing further needed.  

## 2017-07-16 ENCOUNTER — Ambulatory Visit: Payer: Self-pay | Admitting: Family Medicine

## 2017-07-17 ENCOUNTER — Ambulatory Visit: Payer: 59 | Admitting: Psychology

## 2017-07-17 DIAGNOSIS — F332 Major depressive disorder, recurrent severe without psychotic features: Secondary | ICD-10-CM

## 2017-07-21 ENCOUNTER — Ambulatory Visit: Payer: Self-pay | Admitting: Family Medicine

## 2017-07-24 ENCOUNTER — Ambulatory Visit: Payer: 59 | Admitting: Psychology

## 2017-07-24 ENCOUNTER — Ambulatory Visit: Payer: 59 | Admitting: Family Medicine

## 2017-07-24 ENCOUNTER — Encounter: Payer: Self-pay | Admitting: Family Medicine

## 2017-07-24 VITALS — BP 118/88 | HR 44 | Ht 71.0 in | Wt 201.0 lb

## 2017-07-24 DIAGNOSIS — F332 Major depressive disorder, recurrent severe without psychotic features: Secondary | ICD-10-CM

## 2017-07-24 DIAGNOSIS — M7551 Bursitis of right shoulder: Secondary | ICD-10-CM | POA: Diagnosis not present

## 2017-07-24 DIAGNOSIS — M999 Biomechanical lesion, unspecified: Secondary | ICD-10-CM | POA: Diagnosis not present

## 2017-07-24 MED ORDER — GABAPENTIN 100 MG PO CAPS: 200.0000 mg | ORAL_CAPSULE | Freq: Every day | ORAL | 3 refills | Status: DC

## 2017-07-24 MED ORDER — PREDNISONE 50 MG PO TABS: 50.0000 mg | ORAL_TABLET | Freq: Every day | ORAL | 0 refills | Status: DC

## 2017-07-24 NOTE — Assessment & Plan Note (Signed)
Decision today to treat with OMT was based on Physical Exam  After verbal consent patient was treated with HVLA, ME, FPR techniques in cervical, thoracic, rib lumbar and sacral areas  Patient tolerated the procedure well with improvement in symptoms  Patient given exercises, stretches and lifestyle modifications  See medications in patient instructions if given  Patient will follow up in 4 weeks 

## 2017-07-24 NOTE — Progress Notes (Signed)
Tawana ScaleZach Jaedyn Long D.O. [facility] [address] [place], [place] [address] Phone: [phone] Subjective:     CC:  Shoulder and hip pain   BJY:[id number]:Subjective  Robyn MelterMichele Long is a 31 y.o. female coming in for follow up for shoulder and hip. Her shoulder is better. Her hip still bothers her from day to day. She said that hip flexion is still bothersome as well as hip adduction.   Right shoulder pain seems to be doing okay.  Has responded to osteopathic manipulation continues to teach yoga.  Patient was also found to have a potential right hip pain.  Possible sports hernia.  Nitroglycerin.  Mild dizziness but otherwise no side effects.  Patient has noticed very minimal improvement.      Past Medical History:  Diagnosis Date  . Abnormal Pap smear of cervix 2014  . Allergy   . Arthritis   . Depression   . History of chicken pox    Past Surgical History:  Procedure Laterality Date  . CRYOTHERAPY  2014   for abnormal pap smear   Social History   Socioeconomic History  . Marital status: Married    Spouse name: Not on file  . Number of children: Not on file  . Years of education: 16+  . Highest education level: Not on file  Social Needs  . Financial resource strain: Not on file  . Food insecurity - worry: Not on file  . Food insecurity - inability: Not on file  . Transportation needs - medical: Not on file  . Transportation needs - non-medical: Not on file  Occupational History  . Occupation: Associate Professorerver/Freelance Dancer    Employer: [employer]  Tobacco Use  . Smoking status: Never Smoker  . Smokeless tobacco: Never Used  Substance and Sexual Activity  . Alcohol use: Yes    Alcohol/week: 1.8 - 2.4 oz    Types: 3 - 4 Standard drinks or equivalent per week  . Drug use: No  . Sexual activity: Yes    Partners: Male    Birth control/protection: Condom  Other Topics Concern  . Not on file  Social History Narrative   Caffeine Use-yes   Regular exercise-yes   No  Known Allergies Family History  Problem Relation Age of Onset  . Mitral valve prolapse Father   . Hypertension Mother   . Colon polyps Mother   . Breast cancer Maternal Aunt 40       deceased from breast cancer  . Lung cancer Paternal Grandfather   . Mitral valve prolapse Paternal Aunt   . Lung cancer Maternal Grandfather   . Mitral valve prolapse Paternal Aunt   . Colon cancer Neg Hx      Past medical history, social, surgical and family history all reviewed in electronic medical record.  No pertanent information unless stated regarding to the chief complaint.   Review of Systems:Review of systems updated and as accurate as of 07/24/17  No headache, visual changes, nausea, vomiting, diarrhea, constipation, dizziness, abdominal pain, skin rash, fevers, chills, night sweats, weight loss, swollen lymph nodes, body aches, joint swelling, muscle aches, chest pain, shortness of breath, mood changes.   Objective  There were no vitals taken for this visit. Systems examined below as of 07/24/17   General: No apparent distress alert and oriented x3 mood and affect normal, dressed appropriately.  HEENT: Pupils equal, extraocular movements intact  Respiratory: Patient's speak in full sentences and does not appear short of breath  Cardiovascular: No  lower extremity edema, non tender, no erythema  Skin: Warm dry intact with no signs of infection or rash on extremities or on axial skeleton.  Abdomen: Soft nontender  Neuro: Cranial nerves II through XII are intact, neurovascularly intact in all extremities with 2+ DTRs and 2+ pulses.  Lymph: No lymphadenopathy of posterior or anterior cervical chain or axillae bilaterally.  Gait normal with good balance and coordination.  MSK:  Non tender with full range of motion and good stability and symmetric strength and tone of  elbows, wrist, hip, knee and ankles bilaterally.  Shoulder:right  Inspection reveals no abnormalities, atrophy or  asymmetry. Palpation is normal with no tenderness over AC joint or bicipital groove. ROM is full in all planes. Rotator cuff strength normal throughout. Positive impingement Speeds and Yergason's tests normal. No labral pathology noted with negative Obrien's, negative clunk and good stability. Normal scapular function observed. No painful arc and no drop arm sign. No apprehension sign Contralateral shoulder unremarkable  Exam shows the patient does have an increase in lordosis.  Mild rotation.  Negative Spurling's.  Patient does have some tightness of the right hip flexor compared to the contralateral side.  Continues to have pain more in the groin area.  Even to light palpation.  No masses appreciated.  Osteopathic findings C2 flexed rotated and side bent right C4 flexed rotated and side bent left C7 flexed rotated and side bent right  T2 extended rotated and side bent right inhaled rib T9 extended rotated and side bent left L2 flexed rotated and side bent right Sacrum right on right    Impression and Recommendations:     This case required medical decision making of moderate complexity.      Note: This dictation was prepared with Dragon dictation along with smaller phrase technology. Any transcriptional errors that result from this process are unintentional.

## 2017-07-24 NOTE — Assessment & Plan Note (Signed)
Doing well, rib slipped again  HEP in 4 weeks

## 2017-07-24 NOTE — Patient Instructions (Addendum)
Good to see you  Gabapentin 200mg  at night Prednisone daily for 5 days  Ice is your friend I think  If not better we will consider injection  See me again in 4 weeks

## 2017-07-31 ENCOUNTER — Ambulatory Visit: Payer: Self-pay | Admitting: Psychology

## 2017-08-07 ENCOUNTER — Ambulatory Visit: Payer: Self-pay | Admitting: Psychology

## 2017-08-14 ENCOUNTER — Ambulatory Visit: Payer: 59 | Admitting: Psychology

## 2017-08-14 DIAGNOSIS — F332 Major depressive disorder, recurrent severe without psychotic features: Secondary | ICD-10-CM

## 2017-08-20 NOTE — Progress Notes (Signed)
[name] [address] [place], [place] [address] Phone: [phone] Subjective:    CC: Right hip pain follow-up  OZH:[id number]  Robyn Long is a 32 y.o. female coming in with complaint of *right hip pain.  Was found to have what seemed to be a possible sports hernia.  Started on nitroglycerin.  Patient was to do home exercises.  Patient has been doing well.  Has noticed improvement.  Has even had days where she has had significant decrease in pain.  Has noticed some association with her menstruation  In addition to this patient did have more of a shoulder bursitis as well as a bicep tendinitis of the right shoulder.  Is doing very well.  Patient states that it still stable.  Not severe.  Patient was having neck pain.  Started osteopathic manipulation.  Was given posture and ergonomic exercises.  Patient states doing well.  Not doing the exercises regularly.  Does notice that sitting for a long amount of time seems to make it worse.     Past Medical History:  Diagnosis Date  . Abnormal Pap smear of cervix 2014  . Allergy   . Arthritis   . Depression   . History of chicken pox    Past Surgical History:  Procedure Laterality Date  . CRYOTHERAPY  2014   for abnormal pap smear   Social History   Socioeconomic History  . Marital status: Married    Spouse name: None  . Number of children: None  . Years of education: 16+  . Highest education level: None  Social Needs  . Financial resource strain: None  . Food insecurity - worry: None  . Food insecurity - inability: None  . Transportation needs - medical: None  . Transportation needs - non-medical: None  Occupational History  . Occupation: Associate Professor: [employer]  Tobacco Use  . Smoking status: Never Smoker  . Smokeless tobacco: Never Used  Substance and Sexual Activity  . Alcohol use: Yes    Alcohol/week: 1.8 - 2.4 oz    Types: 3 - 4 Standard drinks or  equivalent per week  . Drug use: No  . Sexual activity: Yes    Partners: Male    Birth control/protection: Condom  Other Topics Concern  . None  Social History Narrative   Caffeine Use-yes   Regular exercise-yes   No Known Allergies Family History  Problem Relation Age of Onset  . Mitral valve prolapse Father   . Hypertension Mother   . Colon polyps Mother   . Breast cancer Maternal Aunt 40       deceased from breast cancer  . Lung cancer Paternal Grandfather   . Mitral valve prolapse Paternal Aunt   . Lung cancer Maternal Grandfather   . Mitral valve prolapse Paternal Aunt   . Colon cancer Neg Hx      Past medical history, social, surgical and family history all reviewed in electronic medical record.  No pertanent information unless stated regarding to the chief complaint.   Review of Systems:Review of systems updated and as accurate as of 08/21/17  No headache, visual changes, nausea, vomiting, diarrhea, constipation, dizziness, abdominal pain, skin rash, fevers, chills, night sweats, weight loss, swollen lymph nodes, body aches, joint swelling, m chest pain, shortness of breath, mood changes.  Positive muscle aches  Objective  Blood pressure 138/78, pulse (!) 116, height 5\' 11"  (1.803 m), weight 199 lb (90.3 kg), SpO2  98 %. Systems examined below as of 08/21/17   General: No apparent distress alert and oriented x3 mood and affect normal, dressed appropriately.  HEENT: Pupils equal, extraocular movements intact  Respiratory: Patient's speak in full sentences and does not appear short of breath  Cardiovascular: No lower extremity edema, non tender, no erythema  Skin: Warm dry intact with no signs of infection or rash on extremities or on axial skeleton.  Abdomen: Soft nontender  Neuro: Cranial nerves II through XII are intact, neurovascularly intact in all extremities with 2+ DTRs and 2+ pulses.  Lymph: No lymphadenopathy of posterior or anterior cervical chain or axillae  bilaterally.  Gait normal with good balance and coordination.  MSK:  Non tender with full range of motion and good stability and symmetric strength and tone of shoulders, elbows, wrist, knee and ankles bilaterally.  Right hip exam still shows the patient does have some tenderness on the right side of the pubic bone.  No significant swelling noted.  Does open patient does have some very minimal abdominal pain at the insertion of the oblique in the rectus abdominis.  Patient still does have some scapular dyskinesis of the upper back.  Tightness of the parascapular musculature.  Osteopathic findings C2 flexed rotated and side bent right T3 extended rotated and side bent right inhaled third rib T5 extended rotated and side bent left L3 flexed rotated and side bent right Sacrum right on right    Impression and Recommendations:     This case required medical decision making of moderate complexity.      Note: This dictation was prepared with Dragon dictation along with smaller phrase technology. Any transcriptional errors that result from this process are unintentional.

## 2017-08-21 ENCOUNTER — Ambulatory Visit: Payer: 59 | Admitting: Family Medicine

## 2017-08-21 ENCOUNTER — Ambulatory Visit: Payer: Self-pay | Admitting: Psychology

## 2017-08-21 ENCOUNTER — Encounter: Payer: Self-pay | Admitting: Family Medicine

## 2017-08-21 VITALS — BP 138/78 | HR 116 | Ht 71.0 in | Wt 199.0 lb

## 2017-08-21 DIAGNOSIS — M999 Biomechanical lesion, unspecified: Secondary | ICD-10-CM | POA: Diagnosis not present

## 2017-08-21 DIAGNOSIS — M25551 Pain in right hip: Secondary | ICD-10-CM | POA: Diagnosis not present

## 2017-08-21 DIAGNOSIS — G2589 Other specified extrapyramidal and movement disorders: Secondary | ICD-10-CM | POA: Diagnosis not present

## 2017-08-21 NOTE — Assessment & Plan Note (Signed)
Decision today to treat with OMT was based on Physical Exam  After verbal consent patient was treated with HVLA, ME, FPR techniques in cervical, thoracic, rib, lumbar and sacral areas  Patient tolerated the procedure well with improvement in symptoms  Patient given exercises, stretches and lifestyle modifications  See medications in patient instructions if given  Patient will follow up in 4-6 weeks 

## 2017-08-21 NOTE — Assessment & Plan Note (Signed)
Stable but right is worse than the left.  Discussed again about posture.  Discussed ergonomics throughout the day they can be beneficial.  Follow-up with me again 4-6 weeks

## 2017-08-21 NOTE — Patient Instructions (Signed)
Good to see you  Lets continue to watch the groin area Continue the nitro  Ice still after activity on the shoulder Posture is still key  See me again in 5-6 weeks

## 2017-08-21 NOTE — Assessment & Plan Note (Signed)
More of a sports hernia.  We discussed icing regimen and home exercises.  We discussed which activities to doing which wants to avoid.  Increase activity slowly.  Patient will follow up with me again in 4-6 weeks.

## 2017-08-27 ENCOUNTER — Ambulatory Visit: Payer: 59 | Admitting: Psychology

## 2017-08-27 DIAGNOSIS — F332 Major depressive disorder, recurrent severe without psychotic features: Secondary | ICD-10-CM

## 2017-08-28 ENCOUNTER — Ambulatory Visit: Payer: Self-pay | Admitting: Psychology

## 2017-09-04 ENCOUNTER — Ambulatory Visit: Payer: Self-pay | Admitting: Psychology

## 2017-09-08 ENCOUNTER — Telehealth: Payer: Self-pay | Admitting: Obstetrics and Gynecology

## 2017-09-08 NOTE — Telephone Encounter (Signed)
Patient called and requested to schedule an appointment with Dr. Oscar LaJertson for "pelvic pain that's been going on for awhile."  Last seen: 11/13/16

## 2017-09-08 NOTE — Telephone Encounter (Signed)
The patient had a normal pelvic ultrasound on 04/09/17 for right sided pain. I don't think another ultrasound is needed at this time. I would just recommend an office visit.

## 2017-09-08 NOTE — Telephone Encounter (Signed)
Spoke with patient. Reports ongoing right sided hip and pelvic pain, started end of July 2018. 3/10 on pain scale, intermittent, painful to touch and with movement. experiences pain with intercourse and increased pressure with bowel movements and voiding.   Condoms for contraceptive. Cycles are "normal". LMP 08/24/17  Denies any other GYN symptoms.   Has been seen by PCP and PT, was advised to f/u with GYN for further evaluation.   No medications for pain.   Patient states [facility], [facility], plans to resume care here.    Recommended PUS with consult for further evaluation. PUS scheduled for 09/22/17 at 2:30pm, consult to follow at 3pm with Dr. Oscar LaJertson. Patient declined earlier appt offered for 2/7. Advised patient will review with Dr. Oscar LaJertson, will return call with any additional recommendations. Patient is agreeable.  Dr. Oscar LaJertson -ok to proceed with PUS as scheduled?

## 2017-09-09 NOTE — Telephone Encounter (Signed)
Left message to call Kaitlyn at [phone]. 

## 2017-09-09 NOTE — Telephone Encounter (Signed)
Return call to Jill. °

## 2017-09-09 NOTE — Telephone Encounter (Signed)
Left message to call Moana Munford at [phone].  

## 2017-09-09 NOTE — Telephone Encounter (Signed)
Spoke with patient, advised as seen below per Dr. Oscar LaJertson. PUS cancelled, OV scheduled for 09/15/17 at 2:15pm with Dr. Oscar LaJertson. Patient verbalizes understanding and is agreeable.   Routing to provider for final review. Patient is agreeable to disposition. Will close encounter.  Cc: Robyn JewelKaitlyn S.

## 2017-09-11 ENCOUNTER — Ambulatory Visit: Payer: 59 | Admitting: Psychology

## 2017-09-11 ENCOUNTER — Ambulatory Visit: Payer: Self-pay | Admitting: Psychology

## 2017-09-11 DIAGNOSIS — F332 Major depressive disorder, recurrent severe without psychotic features: Secondary | ICD-10-CM | POA: Diagnosis not present

## 2017-09-15 ENCOUNTER — Other Ambulatory Visit: Payer: Self-pay

## 2017-09-15 ENCOUNTER — Encounter: Payer: Self-pay | Admitting: Obstetrics and Gynecology

## 2017-09-15 ENCOUNTER — Ambulatory Visit: Payer: 59 | Admitting: Obstetrics and Gynecology

## 2017-09-15 VITALS — BP 128/80 | HR 108 | Resp 16 | Wt 197.0 lb

## 2017-09-15 DIAGNOSIS — M6289 Other specified disorders of muscle: Secondary | ICD-10-CM | POA: Diagnosis not present

## 2017-09-15 DIAGNOSIS — N941 Unspecified dyspareunia: Secondary | ICD-10-CM | POA: Diagnosis not present

## 2017-09-15 DIAGNOSIS — R102 Pelvic and perineal pain: Secondary | ICD-10-CM

## 2017-09-15 MED ORDER — CYCLOBENZAPRINE HCL 5 MG PO TABS: 5.0000 mg | ORAL_TABLET | Freq: Three times a day (TID) | ORAL | 0 refills | Status: DC | PRN

## 2017-09-15 NOTE — Progress Notes (Signed)
GYNECOLOGY  VISIT   HPI: 32 y.o.   Married  Caucasian  female   G0P0000 with Patient's last menstrual period was 08/24/2017.   here c/o intermittent pelvic pain X 1 yr   She started having groin and hip pain on the right about a year ago. She saw PT and primary MD, then ended up seeing Ruben Gottron. Sometimes the pain radiates in to the pubic symphisis. Sometimes has pain in the RLQ of her abdomen. The pain has been present almost all of the time in the last year. It waxes and wanes in intensity, the last 2 times the pain has been worse with her cycle. Prior to that didn't match with her cycle.  The pain is a weird, dull ache that pulses. Baseline pain is annoying, 2/10 in severity, at times it can get up to an 8-9/10 in severity. The really severe pain is sharp pain on to of the dull pain. It catches her breath at times. The severe pain can last for up to 1/2 a day for 2-3 days. For the last 2 months the pain has been severe one time a month for a few days. Prior to that she has had that severe pain a couple of times.  She has intermittent deep dyspareunia, always on the right.  When the pain first started it hurt to walk, now she can walk, but still feels some pain. Sometimes has pain when she is still. Teaches dance and yoga. Some positions are painful. She saw an Orthopedic MD, negative evaluation.  As part of her work up she has had a negative pelvic ultrasound, negative X-Ray of her hip and a negative abdominal pelvic CT. Cycles are 25 days x 5 days. She can saturate a super tampon in 1-2 hours. No BTB. Other than the last 2 months she hasn't had pain with her cycles. The last 2 months was exacerbation of her baseline pain. She and her husband would like to get pregnant, not trying because she isn't feeling well.  No other joint or muscle pain. No other system symptoms.  Typically she has a BM every 1-2 days. It hurts to strain for a BM when she is having the pain or even to strain to void.  Sometimes has the sensation that she needs to void right after she finishes voiding, but can't.  She is a professor of dance and teaches yoga.   GYNECOLOGIC HISTORY: Patient's last menstrual period was 08/24/2017. Contraception:condoms -every time  Menopausal hormone therapy: none         OB History    Gravida Para Term Preterm AB Living   0 0 0 0 0 0   SAB TAB Ectopic Multiple Live Births   0 0 0 0           Patient Active Problem List   Diagnosis Date Noted  . Nonallopathic lesion of thoracic region 07/01/2017  . Nonallopathic lesion of sacral region 07/01/2017  . Nonallopathic lesion of lumbosacral region 07/01/2017  . Acute bursitis of right shoulder 06/18/2017  . Scapular dyskinesis 06/18/2017  . Right hip pain 03/23/2017  . RLQ abdominal pain 04/08/2016  . Constipation 04/08/2016  . Cervical intraepithelial neoplasia grade 2 02/10/2013  . Knee pain, right anterior 01/06/2013  . Depression 11/26/2012    Past Medical History:  Diagnosis Date  . Abnormal Pap smear of cervix 2014  . Allergy   . Arthritis   . Depression   . History of chicken pox  Past Surgical History:  Procedure Laterality Date  . CRYOTHERAPY  2014   for abnormal pap smear    Current Outpatient Medications  Medication Sig Dispense Refill  . Diclofenac Sodium (PENNSAID) 2 % SOLN Place 2 application 2 (two) times daily onto the skin. 112 g 3  . glucosamine-chondroitin 500-400 MG tablet Take 1 tablet by mouth daily.    . Multiple Vitamins-Minerals (WOMENS MULTIVITAMIN PLUS) TABS Take 1 tablet by mouth daily.    . nitroGLYCERIN (NITRODUR - DOSED IN MG/24 HR) 0.2 mg/hr patch 1/4 patch daily 30 patch 1   No current facility-administered medications for this visit.      ALLERGIES: Patient has no known allergies.  Family History  Problem Relation Age of Onset  . Mitral valve prolapse Father   . Hypertension Mother   . Colon polyps Mother   . Breast cancer Maternal Aunt 40       deceased  from breast cancer  . Lung cancer Paternal Grandfather   . Mitral valve prolapse Paternal Aunt   . Lung cancer Maternal Grandfather   . Mitral valve prolapse Paternal Aunt   . Colon cancer Neg Hx     Social History   Socioeconomic History  . Marital status: Married    Spouse name: Not on file  . Number of children: Not on file  . Years of education: 16+  . Highest education level: Not on file  Social Needs  . Financial resource strain: Not on file  . Food insecurity - worry: Not on file  . Food insecurity - inability: Not on file  . Transportation needs - medical: Not on file  . Transportation needs - non-medical: Not on file  Occupational History  . Occupation: Associate Professorerver/Freelance Dancer    Employer: [employer]  Tobacco Use  . Smoking status: Never Smoker  . Smokeless tobacco: Never Used  Substance and Sexual Activity  . Alcohol use: Yes    Alcohol/week: 1.8 - 2.4 oz    Types: 3 - 4 Standard drinks or equivalent per week  . Drug use: No  . Sexual activity: Yes    Partners: Male    Birth control/protection: Condom  Other Topics Concern  . Not on file  Social History Narrative   Caffeine Use-yes   Regular exercise-yes    Review of Systems  Constitutional: Negative.   HENT: Negative.   Eyes: Negative.   Respiratory: Negative.   Cardiovascular: Negative.   Gastrointestinal: Positive for abdominal pain and constipation.       Bloating  Genitourinary: Positive for urgency.       Pelvic pain Pain with intercourse  dysmenorrhea  Musculoskeletal: Negative.   Neurological: Negative.   Endo/Heme/Allergies: Negative.   Psychiatric/Behavioral: Negative.     PHYSICAL EXAMINATION:    BP 128/80 (BP Location: Right Arm, Patient Position: Sitting, Cuff Size: Normal)   Pulse (!) 108   Resp 16   Wt 197 lb (89.4 kg)   LMP 08/24/2017   BMI 27.48 kg/m     General appearance: alert, cooperative and appears stated age Abdomen: soft, tender in the right lower quadrant,  no decrease in tenderness with tensing her abdomen. No rebound, no guarding; non distended, no masses,  no organomegaly  Pelvic: External genitalia:  no lesions              Urethra:  normal appearing urethra with no masses, tenderness or lesions              Bartholins and  Skenes: normal                 Vagina: normal appearing vagina with normal color and discharge, no lesions              Cervix: no cervical motion tenderness and no lesions              Bimanual Exam:  Uterus:  normal size, contour, position, consistency, mobility, non-tender and anteverted              Adnexa: no mass, fullness, tenderness              Rectovaginal: Yes.  .  Confirms.              Anus:  normal sphincter tone, no lesions  Bladder: not tender  Pelvic floor: mildly tender on the left posteriorly, tender on the right posteriorly and laterally  Chaperone was present for exam.  ASSESSMENT Pelvic pain, right hip pain, c/w MS etiology. The pain is impacting her life and is bringing her down. She is even holding off on pregnancy because of the pain.  She has a tender pelvic floor, particularly on the right Deep dyspareunia    PLAN Flexeril for prn use, interested to see if it helps her pain She declines NSAID's Will discuss vaginal valium with Ruben Gottron Continue pelvic floor PT She will also f/u with Dr Katrinka Blazing   An After Visit Summary was printed and given to the patient.  Over 30 minutes face to face time of which over 50% was spent in counseling.   CC: Ruben Gottron, PT  Ayesha Mohair, MD

## 2017-09-16 ENCOUNTER — Telehealth: Payer: Self-pay | Admitting: Obstetrics and Gynecology

## 2017-09-16 NOTE — Telephone Encounter (Signed)
I spoke with Robyn Long. She thinks that vaginal valium may be helpful. She also thinks that a hip MRI would be helpful to r/o a hip problem causing the pain. I left a message for the patient (per DPR) that we would call in compounded valium 5 mg in a gel to be inserted vaginally qhs x 2 weeks. #14, no refills. Please call that in to one of the local compounding pharmacies. She should call after she has tried this for 2 weeks. We can also consider gabapentin as an option.

## 2017-09-17 MED ORDER — NONFORMULARY OR COMPOUNDED ITEM: 0 refills | Status: DC

## 2017-09-17 NOTE — Telephone Encounter (Signed)
Rx written and to Dr.Jertson for review and signature before sending to [facility].

## 2017-09-18 ENCOUNTER — Ambulatory Visit: Payer: Self-pay | Admitting: Psychology

## 2017-09-18 ENCOUNTER — Ambulatory Visit: Payer: 59 | Admitting: Psychology

## 2017-09-18 DIAGNOSIS — F332 Major depressive disorder, recurrent severe without psychotic features: Secondary | ICD-10-CM | POA: Diagnosis not present

## 2017-09-18 NOTE — Telephone Encounter (Signed)
Spoke with patient. Advised of message as seen below from Dr.Jertson and that rx for vaginal valium has been sent to [facility] pharmacy and htat she will be notified once it is ready for pick up. Patient will contact the office after 2 weeks of use to provide an update. States she was prescribed Gabapentin before by Dellie Catholicr.Zach Smith and this did not provide any relief so she discontinued this.   Routing to provider for final review. Patient agreeable to disposition. Will close encounter.

## 2017-09-18 NOTE — Telephone Encounter (Signed)
Left message to call Kaitlyn at [phone]. 

## 2017-09-22 ENCOUNTER — Other Ambulatory Visit: Payer: Self-pay

## 2017-09-22 ENCOUNTER — Other Ambulatory Visit: Payer: Self-pay | Admitting: Obstetrics and Gynecology

## 2017-09-24 NOTE — Progress Notes (Signed)
[facility] [address] [place], [place] [address] Phone: [phone] Subjective:     CC: Hip pain  [id number]  Robyn Long is a 32 y.o. female coming in with complaint of hip pain follow-up.  Been treated for potential sports hernia.  Started on nitroglycerin.  Seem to be improving slowly January 11.  Patient still states that this pain is impacting her life.  Affecting daily activities.  Patient does not even want to have a kit at this moment secondary to the amount of pain.  Has done formal physical therapy working on the pelvic floor.  Patient was put on vaginal Valium.  Patient states continues to have the pain.  Still seems to be very intermittent.  Some.  Patient rates the severity of pain is 7 out of 10.  Discussed icing regimen which she has only been doing intermittently.  Feels the gabapentin has not been helping to discontinue completely.     Past Medical History:  Diagnosis Date  . Abnormal Pap smear of cervix 2014  . Allergy   . Arthritis   . Depression   . History of chicken pox    Past Surgical History:  Procedure Laterality Date  . CRYOTHERAPY  2014   for abnormal pap smear   Social History   Socioeconomic History  . Marital status: Married    Spouse name: None  . Number of children: None  . Years of education: 16+  . Highest education level: None  Social Needs  . Financial resource strain: None  . Food insecurity - worry: None  . Food insecurity - inability: None  . Transportation needs - medical: None  . Transportation needs - non-medical: None  Occupational History  . Occupation: Administrator, Civil Service: [employer]  Tobacco Use  . Smoking status: Never Smoker  . Smokeless tobacco: Never Used  Substance and Sexual Activity  . Alcohol use: Yes    Alcohol/week: 1.8 - 2.4 oz    Types: 3 - 4 Standard drinks or equivalent per week  . Drug use: No  . Sexual activity: Yes    Partners: Male   Birth control/protection: Condom  Other Topics Concern  . None  Social History Narrative   Caffeine Use-yes   Regular exercise-yes   No Known Allergies Family History  Problem Relation Age of Onset  . Mitral valve prolapse Father   . Hypertension Mother   . Colon polyps Mother   . Breast cancer Maternal Aunt 40       deceased from breast cancer  . Lung cancer Paternal Grandfather   . Mitral valve prolapse Paternal Aunt   . Lung cancer Maternal Grandfather   . Mitral valve prolapse Paternal Aunt   . Colon cancer Neg Hx      Past medical history, social, surgical and family history all reviewed in electronic medical record.  No pertanent information unless stated regarding to the chief complaint.   Review of Systems:Review of systems updated and as accurate as of 09/25/17  No headache, visual changes, nausea, vomiting, diarrhea, constipation, dizziness, abdominal pain, skin rash, fevers, chills, night sweats, weight loss, swollen lymph nodes, body aches, joint swelling,  chest pain, shortness of breath, mood changes.  Positive muscle aches  Objective  Blood pressure 130/70, pulse 96, height '5\' 10"'  (1.778 m), weight 201 lb (91.2 kg), SpO2 98 %. Systems examined below as of 09/25/17   General: No apparent distress alert and oriented x3 mood  and affect normal, dressed appropriately.  HEENT: Pupils equal, extraocular movements intact  Respiratory: Patient's speak in full sentences and does not appear short of breath  Cardiovascular: No lower extremity edema, non tender, no erythema  Skin: Warm dry intact with no signs of infection or rash on extremities or on axial skeleton.  Abdomen: Soft nontender  Neuro: Cranial nerves II through XII are intact, neurovascularly intact in all extremities with 2+ DTRs and 2+ pulses.  Lymph: No lymphadenopathy of posterior or anterior cervical chain or axillae bilaterally.  Gait normal with good balance and coordination.  MSK:  Non tender with full  range of motion and good stability and symmetric strength and tone of shoulders, elbows, wrist, , knee and ankles bilaterally.  Right hip exam still shows some tenderness to palpation in the inguinal area and near the insertion.  Pubic bone is still somewhat sensitive.  More pain with the hip with internal range of motion.  Some pain still with palpation in the left lower quadrant at the rectus for Morris where it meets the external oblique muscles.  Tightness of the hip flexor noted.  Osteopathic findings T3 extended rotated and side bent right inhaled third rib T9 extended rotated and side bent left L2 flexed rotated and side bent right Sacrum right on right Pelvic shear right    Impression and Recommendations:     This case required medical decision making of moderate complexity.      Note: This dictation was prepared with Dragon dictation along with smaller phrase technology. Any transcriptional errors that result from this process are unintentional.

## 2017-09-25 ENCOUNTER — Encounter: Payer: Self-pay | Admitting: Family Medicine

## 2017-09-25 ENCOUNTER — Ambulatory Visit: Payer: Self-pay | Admitting: Psychology

## 2017-09-25 ENCOUNTER — Ambulatory Visit: Payer: 59 | Admitting: Family Medicine

## 2017-09-25 ENCOUNTER — Ambulatory Visit: Payer: 59 | Admitting: Psychology

## 2017-09-25 ENCOUNTER — Ambulatory Visit: Payer: Self-pay

## 2017-09-25 VITALS — BP 130/70 | HR 96 | Ht 70.0 in | Wt 201.0 lb

## 2017-09-25 DIAGNOSIS — M999 Biomechanical lesion, unspecified: Secondary | ICD-10-CM | POA: Diagnosis not present

## 2017-09-25 DIAGNOSIS — M25551 Pain in right hip: Secondary | ICD-10-CM

## 2017-09-25 NOTE — Patient Instructions (Signed)
Good to see you  I am sorry I do not know for sure Idea  1. Inject the nerve (sports hernia) like area for diagnostic 2.  MRI abdomen pelvis for possible obturator hernia  3. MR-arthrogram hip to rule out labral tear.  Lets keep doing the dry needling and PT  See me again in 4 week s

## 2017-09-25 NOTE — Assessment & Plan Note (Signed)
Patient continues to have pain.  Differential is still quite broad.  Potential sports hernia and we discussed the possibility of injection which patient declined at the moment.  We discussed the possibility of an intra-articular injection in the hip as well for diagnostic purposes in case there is labral pathology.  Patient does not want any other medications at this time either.  We discussed the possibility of MRI of the lumbar, pelvis and right hip to rule out such things as a nerve impingement, vascular compromise, with the potential for labral pathology of the hip.  Patient will consider these things.  Has been long-standing at this point affecting all daily activities.  Patient is even considering not having children because she is fearful that this will make this worse.  Patient will follow up with me again in 4 weeks.

## 2017-09-25 NOTE — Assessment & Plan Note (Signed)
Decision today to treat with OMT was based on Physical Exam  After verbal consent patient was treated with HVLA, ME, FPR techniques in  thoracic, lumbar and sacral areas  Patient tolerated the procedure well with improvement in symptoms  Patient given exercises, stretches and lifestyle modifications  See medications in patient instructions if given  Patient will follow up in 4 weeks 

## 2017-10-02 ENCOUNTER — Ambulatory Visit: Payer: Self-pay | Admitting: Psychology

## 2017-10-02 ENCOUNTER — Ambulatory Visit: Payer: 59 | Admitting: Psychology

## 2017-10-02 DIAGNOSIS — F332 Major depressive disorder, recurrent severe without psychotic features: Secondary | ICD-10-CM

## 2017-10-09 ENCOUNTER — Ambulatory Visit: Payer: 59 | Admitting: Psychology

## 2017-10-09 ENCOUNTER — Ambulatory Visit: Payer: Self-pay | Admitting: Psychology

## 2017-10-09 DIAGNOSIS — F332 Major depressive disorder, recurrent severe without psychotic features: Secondary | ICD-10-CM | POA: Diagnosis not present

## 2017-10-13 ENCOUNTER — Other Ambulatory Visit: Payer: Self-pay

## 2017-10-13 ENCOUNTER — Encounter: Payer: Self-pay | Admitting: Obstetrics and Gynecology

## 2017-10-13 ENCOUNTER — Ambulatory Visit: Payer: 59 | Admitting: Obstetrics and Gynecology

## 2017-10-13 VITALS — BP 142/78 | HR 92 | Resp 12 | Wt 201.0 lb

## 2017-10-13 DIAGNOSIS — R102 Pelvic and perineal pain: Secondary | ICD-10-CM | POA: Diagnosis not present

## 2017-10-13 DIAGNOSIS — M6289 Other specified disorders of muscle: Secondary | ICD-10-CM

## 2017-10-13 MED ORDER — GABAPENTIN 300 MG PO CAPS: 300.0000 mg | ORAL_CAPSULE | Freq: Every day | ORAL | 1 refills | Status: DC

## 2017-10-13 NOTE — Progress Notes (Signed)
GYNECOLOGY  VISIT   HPI: 32 y.o.   Married  Caucasian  female   G0P0000 with Patient's last menstrual period was 10/13/2017.   here for follow up on pelvic pain. The patient has been having groin and right hip pain for a year. She also has intermittent deep dyspareunia on the right. Is followed by Orthopedics and Ruben GottronWilda Young, PT. She has had a negative pelvic ultrasound, X-Ray of her hip and a negative abdominal/pelvic CT.  On prior exam she had pelvic floor tenderness R>L. She was given a script for flexeril and vaginal valium. She tried the vaginal valium for 2 weeks, doesn't think it helped. Hard to tell because the pain comes and goes, but came back on the valium. She tried the flexeril x 2 wasn't helping.  She has previously tried gabapentin without relief.  She has tried needling with PT since her last visit. The # of days she is hurting has decreased some, but still hurting 2/3 of the days, previously was every day. The pain is down to a 5/10, down from 7-8/10. The next step is more imaging or an injection.  She is depressed with the chronic pain, seeing a counselor.   GYNECOLOGIC HISTORY: Patient's last menstrual period was 10/13/2017. Contraception:none  Menopausal hormone therapy: none         OB History    Gravida Para Term Preterm AB Living   0 0 0 0 0 0   SAB TAB Ectopic Multiple Live Births   0 0 0 0           Patient Active Problem List   Diagnosis Date Noted  . Nonallopathic lesion of thoracic region 07/01/2017  . Nonallopathic lesion of sacral region 07/01/2017  . Nonallopathic lesion of lumbosacral region 07/01/2017  . Acute bursitis of right shoulder 06/18/2017  . Scapular dyskinesis 06/18/2017  . Right hip pain 03/23/2017  . RLQ abdominal pain 04/08/2016  . Constipation 04/08/2016  . Cervical intraepithelial neoplasia grade 2 02/10/2013  . Knee pain, right anterior 01/06/2013  . Depression 11/26/2012    Past Medical History:  Diagnosis Date  . Abnormal Pap  smear of cervix 2014  . Allergy   . Arthritis   . Depression   . History of chicken pox     Past Surgical History:  Procedure Laterality Date  . CRYOTHERAPY  2014   for abnormal pap smear    Current Outpatient Medications  Medication Sig Dispense Refill  . cyclobenzaprine (FLEXERIL) 5 MG tablet Take 1 tablet (5 mg total) by mouth 3 (three) times daily as needed for muscle spasms. 30 tablet 0  . Diclofenac Sodium (PENNSAID) 2 % SOLN Place 2 application 2 (two) times daily onto the skin. 112 g 3  . glucosamine-chondroitin 500-400 MG tablet Take 1 tablet by mouth daily.    . Multiple Vitamins-Minerals (WOMENS MULTIVITAMIN PLUS) TABS Take 1 tablet by mouth daily.    . NONFORMULARY OR COMPOUNDED ITEM compounded valium 5 mg in a gel to be inserted vaginally qhs x 2 weeks. #14, no refills. 14 each 0   No current facility-administered medications for this visit.    She is no longer taking the vaginal valium or flexeril  ALLERGIES: Patient has no known allergies.  Family History  Problem Relation Age of Onset  . Mitral valve prolapse Father   . Hypertension Mother   . Colon polyps Mother   . Breast cancer Maternal Aunt 40       deceased from breast cancer  .  Lung cancer Paternal Grandfather   . Mitral valve prolapse Paternal Aunt   . Lung cancer Maternal Grandfather   . Mitral valve prolapse Paternal Aunt   . Colon cancer Neg Hx     Social History   Socioeconomic History  . Marital status: Married    Spouse name: Not on file  . Number of children: Not on file  . Years of education: 16+  . Highest education level: Not on file  Social Needs  . Financial resource strain: Not on file  . Food insecurity - worry: Not on file  . Food insecurity - inability: Not on file  . Transportation needs - medical: Not on file  . Transportation needs - non-medical: Not on file  Occupational History  . Occupation: Associate Professor: [employer]  Tobacco Use  .  Smoking status: Never Smoker  . Smokeless tobacco: Never Used  Substance and Sexual Activity  . Alcohol use: Yes    Alcohol/week: 1.8 - 2.4 oz    Types: 3 - 4 Standard drinks or equivalent per week  . Drug use: No  . Sexual activity: Yes    Partners: Male    Birth control/protection: Condom  Other Topics Concern  . Not on file  Social History Narrative   Caffeine Use-yes   Regular exercise-yes    Review of Systems  Constitutional: Negative.   HENT: Negative.   Eyes: Negative.   Respiratory: Negative.   Cardiovascular: Negative.   Gastrointestinal: Negative.   Genitourinary: Negative.   Musculoskeletal: Negative.   Skin: Negative.   Neurological: Negative.   Endo/Heme/Allergies: Negative.   Psychiatric/Behavioral: Negative.     PHYSICAL EXAMINATION:    BP (!) 142/78 (BP Location: Right Arm, Patient Position: Sitting, Cuff Size: Normal)   Pulse 92   Resp 12   Wt 201 lb (91.2 kg)   LMP 10/13/2017   BMI 28.84 kg/m     General appearance: alert, cooperative and appears stated age Abdomen: soft, minimally tender low in the RLQ, no rebound, no guarding. Still tender with tensing her abdominal wall. Non distended, no masses,  no organomegaly  Pelvic: External genitalia:  no lesions              Urethra:  normal appearing urethra with no masses, tenderness or lesions              Bartholins and Skenes: normal                 Cervix: no cervical motion tenderness              Bimanual Exam:  Uterus:  normal size, contour, position, consistency, mobility, non-tender              Adnexa: no mass, fullness, tenderness              Pelvic floor: mildly tender on the right posteriorly, no longer tender laterally on the right or on the left pelvic floor.   Chaperone was present for exam.  ASSESSMENT Pelvic/groin/hip pain. Not helped with vaginal valium or flexeril. Thinks PT is helping. She has had an improvement in the frequency and severity of the pain. She does have trouble  sleeping with the pain.  She previously tried gabapentin 200 mg qhs, didn't seem to help Depression, seeing a counselor, declines medication    PLAN Continue PT F/U with Orthopedics Will try increasing her gabapentin to 300 mg qhs, discussed that gabapentin can be taken TID  if needed, can cause drowsiness. Can also increase the pm dose if needed. Discussed that it can take 1-2 months for full effect F/U as needed   An After Visit Summary was printed and given to the patient.  ~15 minutes face to face time of which over 50% was spent in counseling.   CC: Dr Katrinka Blazing, Ruben Gottron PT

## 2017-10-16 ENCOUNTER — Ambulatory Visit: Payer: 59 | Admitting: Psychology

## 2017-10-16 ENCOUNTER — Ambulatory Visit: Payer: Self-pay | Admitting: Psychology

## 2017-10-16 DIAGNOSIS — F332 Major depressive disorder, recurrent severe without psychotic features: Secondary | ICD-10-CM | POA: Diagnosis not present

## 2017-10-23 ENCOUNTER — Ambulatory Visit: Payer: 59 | Admitting: Psychology

## 2017-10-23 ENCOUNTER — Ambulatory Visit: Payer: Self-pay | Admitting: Family Medicine

## 2017-10-30 ENCOUNTER — Ambulatory Visit: Payer: 59 | Admitting: Psychology

## 2017-11-05 NOTE — Progress Notes (Signed)
Robyn Long [address] [place], [place] [address] Phone: [phone] Subjective:    I'm seeing this patient by the request  of:    CC: Right hip and abdominal pain follow-up  [id number]  Robyn Long is a 32 y.o. female coming in with complaint of hip pain. She said that she is feeling a little bit better. She said that her pain is becoming more localized and has not been radiating as much as it was before. Patient does note hurting her knee so she has not been doing physical activity so her hip may have improved from being less active patient states.  Patient is still not doing regular daily activities.  Patient feels that this is all from long ago it is not seeming to make significant improvement.  Has seen many different providers.  Patient is frustrated at this time because she is never without pain.  Patient has had workup with a CT abdomen pelvis back in April 11, 2016.  Found to have a small hiatal hernia but otherwise unremarkable. Lumbar x-ray showed very minimal arthritic changes of the lumbar spine. Patient did have right hip x-rays negative for any arthritic changes.    Past Medical History:  Diagnosis Date  . Abnormal Pap smear of cervix 2014  . Allergy   . Arthritis   . Depression   . History of chicken pox    Past Surgical History:  Procedure Laterality Date  . CRYOTHERAPY  2014   for abnormal pap smear   Social History   Socioeconomic History  . Marital status: Married    Spouse name: Not on file  . Number of children: Not on file  . Years of education: 16+  . Highest education level: Not on file  Occupational History  . Occupation: Associate Professor: [employer]  Social Needs  . Financial resource strain: Not on file  . Food insecurity:    Worry: Not on file    Inability: Not on file  . Transportation needs:    Medical: Not on file    Non-medical: Not on file  Tobacco Use  . Smoking  status: Never Smoker  . Smokeless tobacco: Never Used  Substance and Sexual Activity  . Alcohol use: Yes    Alcohol/week: 1.8 - 2.4 oz    Types: 3 - 4 Standard drinks or equivalent per week  . Drug use: No  . Sexual activity: Yes    Partners: Male    Birth control/protection: Condom  Lifestyle  . Physical activity:    Days per week: Not on file    Minutes per session: Not on file  . Stress: Not on file  Relationships  . Social connections:    Talks on phone: Not on file    Gets together: Not on file    Attends religious service: Not on file    Active member of club or organization: Not on file    Attends meetings of clubs or organizations: Not on file    Relationship status: Not on file  Other Topics Concern  . Not on file  Social History Narrative   Caffeine Use-yes   Regular exercise-yes   No Known Allergies Family History  Problem Relation Age of Onset  . Mitral valve prolapse Father   . Hypertension Mother   . Colon polyps Mother   . Breast cancer Maternal Aunt 40       deceased from breast cancer  .  Lung cancer Paternal Grandfather   . Mitral valve prolapse Paternal Aunt   . Lung cancer Maternal Grandfather   . Mitral valve prolapse Paternal Aunt   . Colon cancer Neg Hx      Past medical history, social, surgical and family history all reviewed in electronic medical record.  No pertanent information unless stated regarding to the chief complaint.   Review of Systems:Review of systems updated and as accurate as of 11/06/17  No headache, visual changes, nausea, vomiting, diarrhea, constipation, dizziness, abdominal pain, skin rash, fevers, chills, night sweats, weight loss, swollen lymph nodes, , chest pain, shortness of breath, mood changes.  Positive body aches, muscle aches  Objective  Blood pressure 120/90, pulse (!) 109, height 5\' 10"  (1.778 m), weight 196 lb (88.9 kg), last menstrual period 10/13/2017, SpO2 98 %. Systems examined below as of 11/06/17     General: No apparent distress alert and oriented x3 mood and affect normal, dressed appropriately.  HEENT: Pupils equal, extraocular movements intact  Respiratory: Patient's speak in full sentences and does not appear short of breath  Cardiovascular: No lower extremity edema, non tender, no erythema  Skin: Warm dry intact with no signs of infection or rash on extremities or on axial skeleton.  Abdomen: Diffusely tender noted more of the right lower quadrant still.  No rebound tenderness.  No pain to percussion.  Patient's pain was out of proportion to the amount of palpation. Neuro: Cranial nerves II through XII are intact, neurovascularly intact in all extremities with 2+ DTRs and 2+ pulses.  Lymph: No lymphadenopathy of posterior or anterior cervical chain or axillae bilaterally.  Gait normal with good balance and coordination.  MSK:  Non tender with full range of motion and good stability and symmetric strength and tone of shoulders, elbows, wrist, knee and ankles bilaterally.  Mild hypermobility of multiple joints Patient's right hip does have decreasing internal range of motion compared to the contralateral side.  Positive grind test.  Negative straight leg test.  Pain over the abdominal, pelvic bone, as well as the joint itself.      Impression and Recommendations:     This case required medical decision making of moderate complexity.      Note: This dictation was prepared with Dragon dictation along with smaller phrase technology. Any transcriptional errors that result from this process are unintentional.

## 2017-11-06 ENCOUNTER — Encounter: Payer: Self-pay | Admitting: Family Medicine

## 2017-11-06 ENCOUNTER — Ambulatory Visit: Payer: 59 | Admitting: Family Medicine

## 2017-11-06 ENCOUNTER — Ambulatory Visit (INDEPENDENT_AMBULATORY_CARE_PROVIDER_SITE_OTHER): Payer: 59 | Admitting: Psychology

## 2017-11-06 VITALS — BP 120/90 | HR 109 | Ht 70.0 in | Wt 196.0 lb

## 2017-11-06 DIAGNOSIS — M25551 Pain in right hip: Secondary | ICD-10-CM | POA: Diagnosis not present

## 2017-11-06 DIAGNOSIS — R1031 Right lower quadrant pain: Secondary | ICD-10-CM | POA: Diagnosis not present

## 2017-11-06 DIAGNOSIS — S3981XA Other specified injuries of abdomen, initial encounter: Secondary | ICD-10-CM | POA: Diagnosis not present

## 2017-11-06 DIAGNOSIS — F332 Major depressive disorder, recurrent severe without psychotic features: Secondary | ICD-10-CM | POA: Diagnosis not present

## 2017-11-06 MED ORDER — TIZANIDINE HCL 4 MG PO TABS: 4.0000 mg | ORAL_TABLET | Freq: Every evening | ORAL | 2 refills | Status: AC

## 2017-11-06 MED ORDER — HYDROXYZINE HCL 10 MG PO TABS: 10.0000 mg | ORAL_TABLET | Freq: Three times a day (TID) | ORAL | 0 refills | Status: DC | PRN

## 2017-11-06 NOTE — Patient Instructions (Signed)
Good to see you  Robyn Long is your friend.  Lets get the MRI at this time.  Hydroxyzine up to 3 times a day for anxiety  Zanaflex (muscle relaxer if needed at night as well  [facility]

## 2017-11-06 NOTE — Assessment & Plan Note (Signed)
Difficult overall.  Patient is not made significant improvement as well with all different physical therapy.  Patient has been doing home exercise, gabapentin, topical anti-inflammatories as well as nitroglycerin.  No significant benefit.  I do feel that there is some underlying depression and anxiety that could be contributing to some of the pain but I do need to rule out such things as a potential obturator hernia, sports hernia as well.  I also would like to rule out any labral pathology of the hip that could be contributing to her pain.  Patient has been attempting and working very hard to improve and has not been during the course of time.  Muscle relaxer and hydroxyzine given today.  Depending on imaging will discuss further treatment and management.

## 2017-11-09 NOTE — Addendum Note (Signed)
Addended by: Edwena FeltyARSON, LINDSAY T on: 11/09/2017 11:19 AM   Modules accepted: Orders

## 2017-11-09 NOTE — Addendum Note (Signed)
Addended by: Edwena FeltyARSON, LINDSAY T on: 11/09/2017 11:16 AM   Modules accepted: Orders

## 2017-11-13 ENCOUNTER — Ambulatory Visit: Payer: 59 | Admitting: Psychology

## 2017-11-13 DIAGNOSIS — F332 Major depressive disorder, recurrent severe without psychotic features: Secondary | ICD-10-CM

## 2017-11-17 ENCOUNTER — Ambulatory Visit: Payer: BLUE CROSS/BLUE SHIELD | Admitting: Certified Nurse Midwife

## 2017-11-20 ENCOUNTER — Ambulatory Visit: Payer: 59 | Admitting: Psychology

## 2017-11-20 DIAGNOSIS — F332 Major depressive disorder, recurrent severe without psychotic features: Secondary | ICD-10-CM | POA: Diagnosis not present

## 2017-11-27 ENCOUNTER — Other Ambulatory Visit: Payer: Self-pay

## 2017-11-27 ENCOUNTER — Ambulatory Visit
Admission: RE | Admit: 2017-11-27 | Discharge: 2017-11-27 | Disposition: A | Discharge status: Home / Self Care | Payer: 59 | Source: Ambulatory Visit | Attending: Family Medicine | Admitting: Family Medicine

## 2017-11-27 DIAGNOSIS — R1031 Right lower quadrant pain: Secondary | ICD-10-CM

## 2017-11-27 DIAGNOSIS — S3981XA Other specified injuries of abdomen, initial encounter: Secondary | ICD-10-CM

## 2017-12-03 ENCOUNTER — Ambulatory Visit
Admission: RE | Admit: 2017-12-03 | Discharge: 2017-12-03 | Disposition: A | Discharge status: Home / Self Care | Payer: 59 | Source: Ambulatory Visit | Attending: Family Medicine | Admitting: Family Medicine

## 2017-12-03 DIAGNOSIS — M25551 Pain in right hip: Secondary | ICD-10-CM

## 2017-12-03 DIAGNOSIS — S3981XA Other specified injuries of abdomen, initial encounter: Secondary | ICD-10-CM

## 2017-12-03 MED ORDER — IOPAMIDOL (ISOVUE-M 200) INJECTION 41%
11.0000 mL | Freq: Once | INTRAMUSCULAR | Status: AC
  Administered 2017-12-03: 11 mL via INTRA_ARTICULAR

## 2017-12-04 ENCOUNTER — Ambulatory Visit: Payer: 59 | Admitting: Psychology

## 2017-12-09 ENCOUNTER — Ambulatory Visit: Payer: Self-pay

## 2017-12-09 ENCOUNTER — Ambulatory Visit: Payer: 59 | Admitting: Family Medicine

## 2017-12-09 ENCOUNTER — Encounter: Payer: Self-pay | Admitting: Family Medicine

## 2017-12-09 VITALS — BP 132/72 | HR 118 | Ht 70.0 in | Wt 198.0 lb

## 2017-12-09 DIAGNOSIS — M25551 Pain in right hip: Secondary | ICD-10-CM

## 2017-12-09 DIAGNOSIS — S73191A Other sprain of right hip, initial encounter: Secondary | ICD-10-CM

## 2017-12-09 DIAGNOSIS — S73199A Other sprain of unspecified hip, initial encounter: Secondary | ICD-10-CM | POA: Insufficient documentation

## 2017-12-09 NOTE — Assessment & Plan Note (Signed)
Patient given injection today.  Near complete resolution of pain almost immediately.  Discussed icing regimen and home exercises.  Patient is very optimistic at the moment.  Hopefully this will continue.  We discussed that there is temporary this would make patient feel a little bit better about the possibility of surgical or correction if needed for possible PRP.  Patient is in agreement with the plan.  Follow-up again in 4 weeks

## 2017-12-09 NOTE — Progress Notes (Signed)
[facility] [address] [place], [place] [address] Phone: [phone] Subjective:    I'm seeing this patient by the request  of:    CC: Right hip pain and abdominal pain follow-up  [id number]  Robyn Long is a 32 y.o. female coming in with complaint of right hip pain. She continues to have constant pain.  Patient was seen previously and was sent for a CT abdomen pelvis as well as a MRI of the right hip.  CT abdomen pelvis was independently visualized by me showing no significant gross abnormality or any type of hernia present. MRI of the hip was also independently visualized by me.  Found to have anterior superior right acetabular labrum fraying but no specific true tear noted otherwise fairly unremarkable. Patient still states that this is constant.  Affecting daily activities.  Patient states that she does not want to do such things as even get pregnant with the discomfort she is having.      Past Medical History:  Diagnosis Date  . Abnormal Pap smear of cervix 2014  . Allergy   . Arthritis   . Depression   . History of chicken pox    Past Surgical History:  Procedure Laterality Date  . CRYOTHERAPY  2014   for abnormal pap smear   Social History   Socioeconomic History  . Marital status: Married    Spouse name: Not on file  . Number of children: Not on file  . Years of education: 16+  . Highest education level: Not on file  Occupational History  . Occupation: Associate Professor: [employer]  Social Needs  . Financial resource strain: Not on file  . Food insecurity:    Worry: Not on file    Inability: Not on file  . Transportation needs:    Medical: Not on file    Non-medical: Not on file  Tobacco Use  . Smoking status: Never Smoker  . Smokeless tobacco: Never Used  Substance and Sexual Activity  . Alcohol use: Yes    Alcohol/week: 1.8 - 2.4 oz    Types: 3 - 4 Standard drinks or equivalent per week    . Drug use: No  . Sexual activity: Yes    Partners: Male    Birth control/protection: Condom  Lifestyle  . Physical activity:    Days per week: Not on file    Minutes per session: Not on file  . Stress: Not on file  Relationships  . Social connections:    Talks on phone: Not on file    Gets together: Not on file    Attends religious service: Not on file    Active member of club or organization: Not on file    Attends meetings of clubs or organizations: Not on file    Relationship status: Not on file  Other Topics Concern  . Not on file  Social History Narrative   Caffeine Use-yes   Regular exercise-yes   No Known Allergies Family History  Problem Relation Age of Onset  . Mitral valve prolapse Father   . Hypertension Mother   . Colon polyps Mother   . Breast cancer Maternal Aunt 40       deceased from breast cancer  . Lung cancer Paternal Grandfather   . Mitral valve prolapse Paternal Aunt   . Lung cancer Maternal Grandfather   . Mitral valve prolapse Paternal Aunt   . Colon cancer Neg Hx  Past medical history, social, surgical and family history all reviewed in electronic medical record.  No pertanent information unless stated regarding to the chief complaint.   Review of Systems:Review of systems updated and as accurate as of 12/09/17  No headache, visual changes, nausea, vomiting, diarrhea, constipation, dizziness, abdominal pain, skin rash, fevers, chills, night sweats, weight loss, swollen lymph nodes, body aches, joint swelling, chest pain, shortness of breath, mood changes.  Positive muscle aches  Objective  Blood pressure 132/72, pulse (!) 118, height  (1.778 m), weight 198 lb (89.8 kg), last menstrual period 12/03/2017, SpO2 94 %. Systems examined below as of 12/09/17   General: No apparent distress alert and oriented x3 mood and affect normal, dressed appropriately.  HEENT: Pupils equal, extraocular movements intact  Respiratory: Patient's speak  in full sentences and does not appear short of breath  Cardiovascular: No lower extremity edema, non tender, no erythema  Skin: Warm dry intact with no signs of infection or rash on extremities or on axial skeleton.  Abdomen: Soft nontender  Neuro: Cranial nerves II through XII are intact, neurovascularly intact in all extremities with 2+ DTRs and 2+ pulses.  Lymph: No lymphadenopathy of posterior or anterior cervical chain or axillae bilaterally.  Gait normal with good balance and coordination.  MSK:  Non tender with full range of motion and good stability and symmetric strength and tone of shoulders, elbows, wrist,  knee and ankles bilaterally.  Hip exam still shows some discomfort and tenderness with forward flexion against resistance.  Patient has a negative straight leg test.  Patient does have a mild positive pain with Pearlean Brownie test as well as with internal rotation.  Positive grind test.  Procedure: Real-time Ultrasound Guided Injection of right hip Device: GE Logiq Q7  Ultrasound guided injection is preferred based studies that show increased duration, increased effect, greater accuracy, decreased procedural pain, increased response rate with ultrasound guided versus blind injection.  Verbal informed consent obtained.  Time-out conducted.  Noted no overlying erythema, induration, or other signs of local infection.  Skin prepped in a sterile fashion.  Local anesthesia: Topical Ethyl chloride.  With sterile technique and under real time ultrasound guidance:  Anterior capsule visualized, needle visualized going to the head neck junction at the anterior capsule. Pictures taken. Patient did have injection of  3 cc of 0.5% Marcaine, and 1 cc of Kenalog 40 mg/dL. Completed without difficulty  Pain immediately resolved suggesting accurate placement of the medication.  Advised to call if fevers/chills, erythema, induration, drainage, or persistent bleeding.  Images permanently stored and available  for review in the ultrasound unit.  Impression: Technically successful ultrasound guided injection.    Impression and Recommendations:     This case required medical decision making of moderate complexity.      Note: This dictation was prepared with Dragon dictation along with smaller phrase technology. Any transcriptional errors that result from this process are unintentional.

## 2017-12-09 NOTE — Patient Instructions (Signed)
Good to see you  Sorry not the perfect answer but we will see you  Ice 20 minutes 2 times daily. Usually after activity and before bed. May need ice in 6 hours.  I am hoping this helps great  See me again in 3 weeks and if not better we will inject the nerve in the stomach area.

## 2017-12-11 ENCOUNTER — Ambulatory Visit: Payer: 59 | Admitting: Psychology

## 2017-12-11 DIAGNOSIS — F332 Major depressive disorder, recurrent severe without psychotic features: Secondary | ICD-10-CM

## 2017-12-18 ENCOUNTER — Ambulatory Visit: Payer: 59 | Admitting: Psychology

## 2017-12-21 ENCOUNTER — Ambulatory Visit: Payer: 59 | Admitting: Psychology

## 2017-12-21 DIAGNOSIS — F4310 Post-traumatic stress disorder, unspecified: Secondary | ICD-10-CM | POA: Diagnosis not present

## 2017-12-23 ENCOUNTER — Ambulatory Visit: Payer: 59 | Admitting: Psychology

## 2017-12-23 DIAGNOSIS — F4310 Post-traumatic stress disorder, unspecified: Secondary | ICD-10-CM

## 2017-12-29 NOTE — Progress Notes (Signed)
Subjective:    Patient ID: Robyn Long, female    DOB: [birth year], 32 y.o.   MRN: [id number]  HPI The patient is here for an acute visit.   Left calf pain:  It started about 6 weeks ago.  There was no obvious injury. It did go away completely.  One week ago it started again.  It was more intense and she was not able to walk correctly.  The pain is in the mid posterior calf.  Yesterday, she started to have numbness/tinlging in her toes.   She denies swelling or change in the skin.  The pain can reach a 8-9/10.    The pain is worse with putting pressure on it and walking.  She has tried heat and ice, but they have not helped.  Tylenol and ibuprofen have not helped.  Sitting for a while improves the pain some, but it returns once she gets up.      Medications and allergies reviewed with patient and updated if appropriate.  Patient Active Problem List   Diagnosis Date Noted  . Labral tear of hip joint 12/09/2017  . Nonallopathic lesion of thoracic region 07/01/2017  . Nonallopathic lesion of sacral region 07/01/2017  . Nonallopathic lesion of lumbosacral region 07/01/2017  . Acute bursitis of right shoulder 06/18/2017  . Scapular dyskinesis 06/18/2017  . Right hip pain 03/23/2017  . RLQ abdominal pain 04/08/2016  . Constipation 04/08/2016  . Cervical intraepithelial neoplasia grade 2 02/10/2013  . Knee pain, right anterior 01/06/2013  . Depression 11/26/2012    Current Outpatient Medications on File Prior to Visit  Medication Sig Dispense Refill  . Diclofenac Sodium (PENNSAID) 2 % SOLN Place 2 application 2 (two) times daily onto the skin. 112 g 3  . gabapentin (NEURONTIN) 300 MG capsule Take 1 capsule (300 mg total) by mouth at bedtime. 30 capsule 1  . glucosamine-chondroitin 500-400 MG tablet Take 1 tablet by mouth daily.    . hydrOXYzine (ATARAX/VISTARIL) 10 MG tablet Take 1 tablet (10 mg total) by mouth 3 (three) times daily as needed. 30 tablet 0  . Multiple  Vitamins-Minerals (WOMENS MULTIVITAMIN PLUS) TABS Take 1 tablet by mouth daily.     No current facility-administered medications on file prior to visit.     Past Medical History:  Diagnosis Date  . Abnormal Pap smear of cervix 2014  . Allergy   . Arthritis   . Depression   . History of chicken pox     Past Surgical History:  Procedure Laterality Date  . CRYOTHERAPY  2014   for abnormal pap smear    Social History   Socioeconomic History  . Marital status: Married    Spouse name: Not on file  . Number of children: Not on file  . Years of education: 16+  . Highest education level: Not on file  Occupational History  . Occupation: Associate Professor: [employer]  Social Needs  . Financial resource strain: Not on file  . Food insecurity:    Worry: Not on file    Inability: Not on file  . Transportation needs:    Medical: Not on file    Non-medical: Not on file  Tobacco Use  . Smoking status: Never Smoker  . Smokeless tobacco: Never Used  Substance and Sexual Activity  . Alcohol use: Yes    Alcohol/week: 1.8 - 2.4 oz    Types: 3 - 4 Standard drinks or equivalent per week  .  Drug use: No  . Sexual activity: Yes    Partners: Male    Birth control/protection: Condom  Lifestyle  . Physical activity:    Days per week: Not on file    Minutes per session: Not on file  . Stress: Not on file  Relationships  . Social connections:    Talks on phone: Not on file    Gets together: Not on file    Attends religious service: Not on file    Active member of club or organization: Not on file    Attends meetings of clubs or organizations: Not on file    Relationship status: Not on file  Other Topics Concern  . Not on file  Social History Narrative   Caffeine Use-yes   Regular exercise-yes    Family History  Problem Relation Age of Onset  . Mitral valve prolapse Father   . Hypertension Mother   . Colon polyps Mother   . Breast cancer Maternal Aunt  40       deceased from breast cancer  . Lung cancer Paternal Grandfather   . Mitral valve prolapse Paternal Aunt   . Lung cancer Maternal Grandfather   . Mitral valve prolapse Paternal Aunt   . Colon cancer Neg Hx     Review of Systems  Constitutional: Negative for chills and fever.  Respiratory: Negative for shortness of breath.   Cardiovascular: Positive for palpitations (occ flip flop in her chest). Negative for chest pain and leg swelling.  Skin: Negative for color change and wound.  Neurological: Positive for light-headedness and numbness.       Objective:   Vitals:   12/30/17 1011  BP: (!) 150/90  Pulse: (!) 115  Resp: 16  Temp: 98.5 F (36.9 C)  SpO2: 98%   BP Readings from Last 3 Encounters:  12/30/17 (!) 150/90  12/09/17 132/72  11/06/17 120/90   Wt Readings from Last 3 Encounters:  12/30/17 196 lb (88.9 kg)  12/09/17 198 lb (89.8 kg)  11/06/17 196 lb (88.9 kg)   Body mass index is 28.12 kg/m.   Physical Exam  Constitutional: She appears well-developed and well-nourished. No distress.  HENT:  Head: Normocephalic and atraumatic.  Pulmonary/Chest: No respiratory distress.  Musculoskeletal: She exhibits tenderness (left posterior mid to distal calf; no pain in knee or anterior lower leg). She exhibits no edema.  Neurological: A sensory deficit (left toes only - decreased sensation) is present.  Skin: Skin is warm and dry. No rash noted. She is not diaphoretic. No erythema.           Assessment & Plan:    See Problem List for Assessment and Plan of chronic medical problems.

## 2017-12-30 ENCOUNTER — Ambulatory Visit: Payer: 59 | Admitting: Internal Medicine

## 2017-12-30 ENCOUNTER — Encounter: Payer: Self-pay | Admitting: Internal Medicine

## 2017-12-30 ENCOUNTER — Ambulatory Visit (HOSPITAL_COMMUNITY)
Admission: RE | Admit: 2017-12-30 | Discharge: 2017-12-30 | Disposition: A | Discharge status: Home / Self Care | Payer: 59 | Source: Ambulatory Visit | Attending: Cardiovascular Disease | Admitting: Cardiovascular Disease

## 2017-12-30 ENCOUNTER — Ambulatory Visit: Payer: 59 | Admitting: Psychology

## 2017-12-30 VITALS — BP 150/90 | HR 115 | Temp 98.5°F | Resp 16 | Wt 196.0 lb

## 2017-12-30 DIAGNOSIS — M79662 Pain in left lower leg: Secondary | ICD-10-CM | POA: Insufficient documentation

## 2017-12-30 NOTE — Patient Instructions (Signed)
Have the ultrasound of your leg today.  We will call you with the results and decide on treatment after that.

## 2017-12-30 NOTE — Progress Notes (Signed)
[name] [address] [place], [place] [address] Phone: [phone] Subjective:      CC: Right hip pain, calf pain, back pain  BJY:NWGNFAOZHY  Robyn Long is a 32 y.o. female coming in with complaint of hip pain. The injection from last visit does seem to have helped her. She still has a deep pain that occurs with hip flexion or from being seated for long periods of time.   Patient also has been having left calf pain for about 6 weeks. Pain went away but came back. She had decreased sensation in the foot on Monday and saw Dr. Lawerance Bach for the calf yesterday. Pain is intermittent but does improve with elevation or icing.       Past Medical History:  Diagnosis Date  . Abnormal Pap smear of cervix 2014  . Allergy   . Arthritis   . Depression   . History of chicken pox    Past Surgical History:  Procedure Laterality Date  . CRYOTHERAPY  2014   for abnormal pap smear   Social History   Socioeconomic History  . Marital status: Married    Spouse name: Not on file  . Number of children: Not on file  . Years of education: 16+  . Highest education level: Not on file  Occupational History  . Occupation: Associate Professor: [employer]  Social Needs  . Financial resource strain: Not on file  . Food insecurity:    Worry: Not on file    Inability: Not on file  . Transportation needs:    Medical: Not on file    Non-medical: Not on file  Tobacco Use  . Smoking status: Never Smoker  . Smokeless tobacco: Never Used  Substance and Sexual Activity  . Alcohol use: Yes    Alcohol/week: 1.8 - 2.4 oz    Types: 3 - 4 Standard drinks or equivalent per week  . Drug use: No  . Sexual activity: Yes    Partners: Male    Birth control/protection: Condom  Lifestyle  . Physical activity:    Days per week: Not on file    Minutes per session: Not on file  . Stress: Not on file  Relationships  . Social connections:    Talks on  phone: Not on file    Gets together: Not on file    Attends religious service: Not on file    Active member of club or organization: Not on file    Attends meetings of clubs or organizations: Not on file    Relationship status: Not on file  Other Topics Concern  . Not on file  Social History Narrative   Caffeine Use-yes   Regular exercise-yes   No Known Allergies Family History  Problem Relation Age of Onset  . Mitral valve prolapse Father   . Hypertension Mother   . Colon polyps Mother   . Breast cancer Maternal Aunt 40       deceased from breast cancer  . Lung cancer Paternal Grandfather   . Mitral valve prolapse Paternal Aunt   . Lung cancer Maternal Grandfather   . Mitral valve prolapse Paternal Aunt   . Colon cancer Neg Hx      Past medical history, social, surgical and family history all reviewed in electronic medical record.  No pertanent information unless stated regarding to the chief complaint.   Review of Systems:Review of systems updated and as accurate as of 12/31/17  No headache, visual changes, nausea, vomiting, diarrhea, constipation, dizziness, abdominal pain, skin rash, fevers, chills, night sweats, weight loss, swollen lymph nodes, body aches, joint swelling,  chest pain, shortness of breath, mood changes.  Positive muscle aches  Objective  Blood pressure 138/80, pulse 61, height  (1.778 m), weight 198 lb (89.8 kg), last menstrual period 12/03/2017, SpO2 93 %. Systems examined below as of 12/31/17   General: No apparent distress alert and oriented x3 mood and affect normal, dressed appropriately.  HEENT: Pupils equal, extraocular movements intact  Respiratory: Patient's speak in full sentences and does not appear short of breath  Cardiovascular: No lower extremity edema, non tender, no erythema  Skin: Warm dry intact with no signs of infection or rash on extremities or on axial skeleton.  Abdomen: Soft nontender  Neuro: Cranial nerves II through XII  are intact, neurovascularly intact in all extremities with 2+ DTRs and 2+ pulses.  Lymph: No lymphadenopathy of posterior or anterior cervical chain or axillae bilaterally.  Gait normal with good balance and coordination.  MSK:  Non tender with full range of motion and good stability and symmetric strength and tone of shoulders, elbows, wrist, , knee and ankles bilaterally.  Right hip exam shows mild improvement in range of motion but still has pain with internal rotation of the hip.  Pain over the internal aspect of the hip as well to palpation.  Negative straight leg test.  Mild positive Pearlean Brownie.  Diffusely uncomfortable to light palpation.  Left calf exam shows tenderness right at the tendon muscular junction.  Negative Thompson test higher though.  Full range of motion of the ankle.  Deep tendon reflexes are intact.  Dorsalis pedis   Osteopathic findings C2 flexed rotated and side bent right C4 flexed rotated and side bent left T3 extended rotated and side bent right inhaled third rib T7 extended rotated and side bent left L4 flexed rotated and side bent right Sacrum right on right     Impression and Recommendations:     This case required medical decision making of moderate complexity.      Note: This dictation was prepared with Dragon dictation along with smaller phrase technology. Any transcriptional errors that result from this process are unintentional.

## 2017-12-30 NOTE — Assessment & Plan Note (Signed)
Need to r/o a blood clot - no risk factors, but symptoms concerning for possible blood clot Stat US of left leg today If negative she has an appointment with Dr Katrinka Blazing tomorrow and hopefully he can help evaluate further  - ? Possible musculoskeletal injury

## 2017-12-31 ENCOUNTER — Ambulatory Visit: Payer: 59 | Admitting: Psychology

## 2017-12-31 ENCOUNTER — Ambulatory Visit: Payer: 59 | Admitting: Family Medicine

## 2017-12-31 ENCOUNTER — Encounter: Payer: Self-pay | Admitting: Family Medicine

## 2017-12-31 VITALS — BP 138/80 | HR 61 | Ht 70.0 in | Wt 198.0 lb

## 2017-12-31 DIAGNOSIS — S73191D Other sprain of right hip, subsequent encounter: Secondary | ICD-10-CM | POA: Diagnosis not present

## 2017-12-31 DIAGNOSIS — F4310 Post-traumatic stress disorder, unspecified: Secondary | ICD-10-CM

## 2017-12-31 DIAGNOSIS — M79662 Pain in left lower leg: Secondary | ICD-10-CM

## 2017-12-31 DIAGNOSIS — M999 Biomechanical lesion, unspecified: Secondary | ICD-10-CM | POA: Diagnosis not present

## 2017-12-31 NOTE — Patient Instructions (Signed)
Good to see you  Exercises 3 times a week.  Ok to increase activity as tolerate for the hip Compression sleeve to the calf with activity  Heel lift in the shoe may help as well  See me again in 5-6 weeks

## 2017-12-31 NOTE — Assessment & Plan Note (Signed)
Likely small strain.  Discussed icing regimen, home exercise, compression, heel lift.  Follow-up in 4 weeks

## 2017-12-31 NOTE — Assessment & Plan Note (Signed)
Patient did respond well to the injection.  We discussed other treatment options including the possibility of a PRP or even being sent to surgery for any type of surgical intervention with her improving.  Do not want to repeat steroid injections regularly.  Discussed icing regimen and home exercises.  Discussed increasing activity slowly.  Follow-up again in 4 weeks.

## 2017-12-31 NOTE — Assessment & Plan Note (Signed)
Decision today to treat with OMT was based on Physical Exam  After verbal consent patient was treated with HVLA, ME, FPR techniques in  thoracic, lumbar and sacral areas  Patient tolerated the procedure well with improvement in symptoms  Patient given exercises, stretches and lifestyle modifications  See medications in patient instructions if given  Patient will follow up in 4 weeks 

## 2018-01-01 ENCOUNTER — Ambulatory Visit: Payer: 59 | Admitting: Psychology

## 2018-01-07 ENCOUNTER — Ambulatory Visit: Payer: 59 | Admitting: Psychology

## 2018-01-07 DIAGNOSIS — F4310 Post-traumatic stress disorder, unspecified: Secondary | ICD-10-CM | POA: Diagnosis not present

## 2018-01-08 ENCOUNTER — Ambulatory Visit: Payer: 59 | Admitting: Psychology

## 2018-01-08 DIAGNOSIS — F4310 Post-traumatic stress disorder, unspecified: Secondary | ICD-10-CM | POA: Diagnosis not present

## 2018-01-14 ENCOUNTER — Ambulatory Visit (INDEPENDENT_AMBULATORY_CARE_PROVIDER_SITE_OTHER): Payer: 59 | Admitting: Psychology

## 2018-01-14 DIAGNOSIS — F4310 Post-traumatic stress disorder, unspecified: Secondary | ICD-10-CM

## 2018-01-15 ENCOUNTER — Ambulatory Visit: Payer: 59 | Admitting: Psychology

## 2018-01-27 ENCOUNTER — Ambulatory Visit (INDEPENDENT_AMBULATORY_CARE_PROVIDER_SITE_OTHER): Payer: 59 | Admitting: Psychology

## 2018-01-27 DIAGNOSIS — F4310 Post-traumatic stress disorder, unspecified: Secondary | ICD-10-CM

## 2018-01-29 ENCOUNTER — Ambulatory Visit: Payer: 59 | Admitting: Psychology

## 2018-02-03 ENCOUNTER — Ambulatory Visit (INDEPENDENT_AMBULATORY_CARE_PROVIDER_SITE_OTHER): Payer: 59 | Admitting: Psychology

## 2018-02-03 DIAGNOSIS — F4310 Post-traumatic stress disorder, unspecified: Secondary | ICD-10-CM | POA: Diagnosis not present

## 2018-02-05 ENCOUNTER — Ambulatory Visit: Payer: 59 | Admitting: Psychology

## 2018-02-08 ENCOUNTER — Ambulatory Visit: Payer: Self-pay | Admitting: Family Medicine

## 2018-02-09 ENCOUNTER — Encounter: Payer: Self-pay | Admitting: Family Medicine

## 2018-02-09 ENCOUNTER — Ambulatory Visit (INDEPENDENT_AMBULATORY_CARE_PROVIDER_SITE_OTHER): Payer: Managed Care, Other (non HMO) | Admitting: Family Medicine

## 2018-02-09 VITALS — BP 124/78 | HR 115 | Ht 70.0 in | Wt 203.0 lb

## 2018-02-09 DIAGNOSIS — M999 Biomechanical lesion, unspecified: Secondary | ICD-10-CM | POA: Diagnosis not present

## 2018-02-09 DIAGNOSIS — S73191D Other sprain of right hip, subsequent encounter: Secondary | ICD-10-CM | POA: Diagnosis not present

## 2018-02-09 NOTE — Assessment & Plan Note (Signed)
Patient is doing fairly well after the injection.  Discussed that we do not want to repeat too many steroid injection.  Discussed PRP.  Discussed home management.  Still responds well to manipulation.  Follow-up again in 4-8 weeks

## 2018-02-09 NOTE — Assessment & Plan Note (Signed)
Decision today to treat with OMT was based on Physical Exam  After verbal consent patient was treated with HVLA, ME, FPR techniques in cervical, thoracic, lumbar and sacral areas  Patient tolerated the procedure well with improvement in symptoms  Patient given exercises, stretches and lifestyle modifications  See medications in patient instructions if given  Patient will follow up in 4-8 weeks 

## 2018-02-09 NOTE — Patient Instructions (Signed)
Good to see you  Ice is your friend Overall not bad We will get you set up for prp  See you soon

## 2018-02-09 NOTE — Progress Notes (Signed)
Robyn ScaleZach Bayli Long D.O. [facility] [address] [place], [place] [address] Phone: [phone]([phone] Subjective:       CC: Back and hip pain follow-up  BJY:NWGNFAOZHYHPI:Subjective  [name] is a 32 y.o. female coming in with complaint of back and hip pain. She states that she is doing better. Patient states that sitting for prolonged periods and then standing up can cause a catching sensation. Sagittal plane movements are ok but anything requiring motions in the transverse plane cause pain.  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  Did respond fairly well to the injection.      Past Medical History:  Diagnosis Date  . Abnormal Pap smear of cervix 2014  . Allergy   . Arthritis   . Depression   . History of chicken pox    Past Surgical History:  Procedure Laterality Date  . CRYOTHERAPY  2014   for abnormal pap smear   Social History   Socioeconomic History  . Marital status: Married    Spouse name: Not on file  . Number of children: Not on file  . Years of education: 16+  . Highest education level: Not on file  Occupational History  . Occupation: Associate Professorerver/Freelance Dancer    Employer: [employer]  Social Needs  . Financial resource strain: Not on file  . Food insecurity:    Worry: Not on file    Inability: Not on file  . Transportation needs:    Medical: Not on file    Non-medical: Not on file  Tobacco Use  . Smoking status: Never Smoker  . Smokeless tobacco: Never Used  Substance and Sexual Activity  . Alcohol use: Yes    Alcohol/week: 1.8 - 2.4 oz    Types: 3 - 4 Standard drinks or equivalent per week  . Drug use: No  . Sexual activity: Yes    Partners: Male    Birth control/protection: Condom  Lifestyle  . Physical activity:    Days per week: Not on file    Minutes per session: Not on file  . Stress: Not on file  Relationships  . Social connections:    Talks on phone: Not on file    Gets together: Not on file    Attends religious service:  Not on file    Active member of club or organization: Not on file    Attends meetings of clubs or organizations: Not on file    Relationship status: Not on file  Other Topics Concern  . Not on file  Social History Narrative   Caffeine Use-yes   Regular exercise-yes   No Known Allergies Family History  Problem Relation Age of Onset  . Mitral valve prolapse Father   . Hypertension Mother   . Colon polyps Mother   . Breast cancer Maternal Aunt 40       deceased from breast cancer  . Lung cancer Paternal Grandfather   . Mitral valve prolapse Paternal Aunt   . Lung cancer Maternal Grandfather   . Mitral valve prolapse Paternal Aunt   . Colon cancer Neg Hx      Past medical history, social, surgical and family history all reviewed in electronic medical record.  No pertanent information unless stated regarding to the chief complaint.   Review of Systems:Review of systems updated and as accurate as of 02/09/18  No headache, visual changes, nausea, vomiting, diarrhea, constipation, dizziness, abdominal pain, skin rash, fevers, chills, night sweats, weight loss,  swollen lymph nodes, body aches, joint swelling, muscle aches, chest pain, shortness of breath, mood changes.   Objective  Blood pressure 124/78, pulse (!) 115, height 5\' 10"  (1.778 m), weight 203 lb (92.1 kg), SpO2 95 %. Systems examined below as of 02/09/18   General: No apparent distress alert and oriented x3 mood and affect normal, dressed appropriately.  HEENT: Pupils equal, extraocular movements intact  Respiratory: Patient's speak in full sentences and does not appear short of breath  Cardiovascular: No lower extremity edema, non tender, no erythema  Skin: Warm dry intact with no signs of infection or rash on extremities or on axial skeleton.  Abdomen: Soft nontender  Neuro: Cranial nerves II through XII are intact, neurovascularly intact in all extremities with 2+ DTRs and 2+ pulses.  Lymph: No lymphadenopathy of  posterior or anterior cervical chain or axillae bilaterally.  Gait normal with good balance and coordination.  MSK:  Non tender with full range of motion and good stability and symmetric strength and tone of shoulders, elbows, wrist,  knee and ankles bilaterally.  Hip: Right ROM IR: 45 Deg, ER: 15 Deg, Flexion: 120 Deg, Extension: 100 Deg, Abduction: 45 Deg, Adduction: 15 Deg Strength IR: 5/5, ER: 5/5, Flexion: 5/5, Extension: 5/5, Abduction: 5/5, Adduction: 5/5 Pelvic alignment unremarkable to inspection and palpation. Standing hip rotation and gait without trendelenburg sign / unsteadiness. Mild pain with internal range of motion Tender over the right sacroiliac joint  Osteopathic findings  T3 extended rotated and side bent right inhaled third rib T6 extended rotated and side bent left L2 flexed rotated and side bent right Sacrum right on right     Impression and Recommendations:     This case required medical decision making of moderate complexity.      Note: This dictation was prepared with Dragon dictation along with smaller phrase technology. Any transcriptional errors that result from this process are unintentional.

## 2018-02-12 ENCOUNTER — Ambulatory Visit: Payer: 59 | Admitting: Psychology

## 2018-02-15 ENCOUNTER — Ambulatory Visit (INDEPENDENT_AMBULATORY_CARE_PROVIDER_SITE_OTHER): Payer: 59 | Admitting: Psychology

## 2018-02-15 DIAGNOSIS — F4310 Post-traumatic stress disorder, unspecified: Secondary | ICD-10-CM | POA: Diagnosis not present

## 2018-02-19 ENCOUNTER — Ambulatory Visit: Payer: 59 | Admitting: Psychology

## 2018-02-22 NOTE — Progress Notes (Signed)
Robyn ScaleZach Mileidy Long D.O. [facility] [address] [place], [place] [address] Phone: [phone] Subjective:    I'm seeing this patient by the request  of:    CC: Right hip pain  BJY:NWGNFAOZHYHPI:Subjective  Robyn MelterMichele Long is a 32 y.o. female coming in with complaint of right hip pain.  Was found to have more of a labral fraying noted on MRI.  Failed all conservative therapy but did respond well to an injection previously.  Here for PRP injection     Past Medical History:  Diagnosis Date  . Abnormal Pap smear of cervix 2014  . Allergy   . Arthritis   . Depression   . History of chicken pox    Past Surgical History:  Procedure Laterality Date  . CRYOTHERAPY  2014   for abnormal pap smear   Social History   Socioeconomic History  . Marital status: Married    Spouse name: Not on file  . Number of children: Not on file  . Years of education: 16+  . Highest education level: Not on file  Occupational History  . Occupation: Associate Professorerver/Freelance Dancer    Employer: [employer]  Social Needs  . Financial resource strain: Not on file  . Food insecurity:    Worry: Not on file    Inability: Not on file  . Transportation needs:    Medical: Not on file    Non-medical: Not on file  Tobacco Use  . Smoking status: Never Smoker  . Smokeless tobacco: Never Used  Substance and Sexual Activity  . Alcohol use: Yes    Alcohol/week: 1.8 - 2.4 oz    Types: 3 - 4 Standard drinks or equivalent per week  . Drug use: No  . Sexual activity: Yes    Partners: Male    Birth control/protection: Condom  Lifestyle  . Physical activity:    Days per week: Not on file    Minutes per session: Not on file  . Stress: Not on file  Relationships  . Social connections:    Talks on phone: Not on file    Gets together: Not on file    Attends religious service: Not on file    Active member of club or organization: Not on file    Attends meetings of clubs or organizations: Not on file    Relationship  status: Not on file  Other Topics Concern  . Not on file  Social History Narrative   Caffeine Use-yes   Regular exercise-yes   No Known Allergies Family History  Problem Relation Age of Onset  . Mitral valve prolapse Father   . Hypertension Mother   . Colon polyps Mother   . Breast cancer Maternal Aunt 40       deceased from breast cancer  . Lung cancer Paternal Grandfather   . Mitral valve prolapse Paternal Aunt   . Lung cancer Maternal Grandfather   . Mitral valve prolapse Paternal Aunt   . Colon cancer Neg Hx      Past medical history, social, surgical and family history all reviewed in electronic medical record.  No pertanent information unless stated regarding to the chief complaint.   Review of Systems:Review of systems updated and as accurate as of 02/23/18  No headache, visual changes, nausea, vomiting, diarrhea, constipation, dizziness, abdominal pain, skin rash, fevers, chills, night sweats, weight loss, swollen lymph nodes, body aches, joint swelling, muscle aches, chest pain, shortness of breath, mood changes.   Objective  Height 5'  10" (1.778 m). Systems examined below as of 02/23/18   General: No apparent distress alert and oriented x3 mood and affect normal, dressed appropriately.    Procedure: Real-time Ultrasound Guided Injection of right hip Device: GE Logiq Q7  Ultrasound guided injection is preferred based studies that show increased duration, increased effect, greater accuracy, decreased procedural pain, increased response rate with ultrasound guided versus blind injection.  Verbal informed consent obtained.  Time-out conducted.  Noted no overlying erythema, induration, or other signs of local infection.  Skin prepped in a sterile fashion.  Local anesthesia: Topical Ethyl chloride.  With sterile technique and under real time ultrasound guidance:  Anterior capsule visualized, needle visualized going to the head neck junction at the anterior capsule.  Pictures taken. Patient did have injection of  3 cc of 0.5% Marcaine, then injected 4 cc of pre-centrifuge PRP Completed without difficulty  Pain immediately resolved suggesting accurate placement of the medication.  Advised to call if fevers/chills, erythema, induration, drainage, or persistent bleeding.  Images permanently stored and available for review in the ultrasound unit.  Impression: Technically successful ultrasound guided injection.   Impression and Recommendations:     This case required medical decision making of moderate complexity.      Note: This dictation was prepared with Dragon dictation along with smaller phrase technology. Any transcriptional errors that result from this process are unintentional.

## 2018-02-23 ENCOUNTER — Encounter: Payer: Self-pay | Admitting: Family Medicine

## 2018-02-23 ENCOUNTER — Ambulatory Visit (INDEPENDENT_AMBULATORY_CARE_PROVIDER_SITE_OTHER): Payer: Self-pay | Admitting: Family Medicine

## 2018-02-23 ENCOUNTER — Other Ambulatory Visit: Payer: Managed Care, Other (non HMO)

## 2018-02-23 ENCOUNTER — Ambulatory Visit: Payer: Self-pay

## 2018-02-23 VITALS — Ht 70.0 in

## 2018-02-23 DIAGNOSIS — M25551 Pain in right hip: Secondary | ICD-10-CM

## 2018-02-23 DIAGNOSIS — S73191D Other sprain of right hip, subsequent encounter: Secondary | ICD-10-CM

## 2018-02-23 NOTE — Assessment & Plan Note (Signed)
Patient given injection.  Tolerated the procedure well.  Discussed icing regimen and home exercises.  Discussed which activities to do which wants to avoid.  Increase activity slowly over the course the next several days.  Patient will follow-up with me again in 4 weeks

## 2018-02-23 NOTE — Patient Instructions (Signed)
Good to see you  No ice or ibuprofen for 3 days if possible Follow instructions.  See me again in 3 weeks

## 2018-02-25 ENCOUNTER — Ambulatory Visit (INDEPENDENT_AMBULATORY_CARE_PROVIDER_SITE_OTHER): Payer: Managed Care, Other (non HMO) | Admitting: Psychology

## 2018-02-25 DIAGNOSIS — F4310 Post-traumatic stress disorder, unspecified: Secondary | ICD-10-CM

## 2018-02-26 ENCOUNTER — Ambulatory Visit: Payer: 59 | Admitting: Psychology

## 2018-03-04 ENCOUNTER — Ambulatory Visit (INDEPENDENT_AMBULATORY_CARE_PROVIDER_SITE_OTHER): Payer: 59 | Admitting: Psychology

## 2018-03-04 DIAGNOSIS — F4310 Post-traumatic stress disorder, unspecified: Secondary | ICD-10-CM

## 2018-03-11 ENCOUNTER — Ambulatory Visit (INDEPENDENT_AMBULATORY_CARE_PROVIDER_SITE_OTHER): Payer: 59 | Admitting: Psychology

## 2018-03-11 DIAGNOSIS — F4310 Post-traumatic stress disorder, unspecified: Secondary | ICD-10-CM

## 2018-03-12 ENCOUNTER — Ambulatory Visit: Payer: 59 | Admitting: Psychology

## 2018-03-15 NOTE — Progress Notes (Signed)
Tawana ScaleZach Smith D.O. [facility] [address] [place], [place] [address] Phone: [phone](336) [phone] Subjective:      CC: Hip pain and back pain follow-up  BJY:NWGNFAOZHYHPI:Subjective  Flint MelterMichele Faivre is a 32 y.o. female coming in with complaint of hip pain. She said that she feels like her hip pain has improved since PRP. Knees to chest still bother her and forward folding as well. She is able to walk without pain. Has had 3 days without pain since last visit.  Has been doing relatively well.  Has noticed that has not having the severe pain anymore.  Nothing that is keeping her up at night.  Has been starting to increase activity well.     Past Medical History:  Diagnosis Date  . Abnormal Pap smear of cervix 2014  . Allergy   . Arthritis   . Depression   . History of chicken pox    Past Surgical History:  Procedure Laterality Date  . CRYOTHERAPY  2014   for abnormal pap smear   Social History   Socioeconomic History  . Marital status: Married    Spouse name: Not on file  . Number of children: Not on file  . Years of education: 16+  . Highest education level: Not on file  Occupational History  . Occupation: Associate Professorerver/Freelance Dancer    Employer: [employer]  Social Needs  . Financial resource strain: Not on file  . Food insecurity:    Worry: Not on file    Inability: Not on file  . Transportation needs:    Medical: Not on file    Non-medical: Not on file  Tobacco Use  . Smoking status: Never Smoker  . Smokeless tobacco: Never Used  Substance and Sexual Activity  . Alcohol use: Yes    Alcohol/week: 1.8 - 2.4 oz    Types: 3 - 4 Standard drinks or equivalent per week  . Drug use: No  . Sexual activity: Yes    Partners: Male    Birth control/protection: Condom  Lifestyle  . Physical activity:    Days per week: Not on file    Minutes per session: Not on file  . Stress: Not on file  Relationships  . Social connections:    Talks on phone: Not on file    Gets together:  Not on file    Attends religious service: Not on file    Active member of club or organization: Not on file    Attends meetings of clubs or organizations: Not on file    Relationship status: Not on file  Other Topics Concern  . Not on file  Social History Narrative   Caffeine Use-yes   Regular exercise-yes   No Known Allergies Family History  Problem Relation Age of Onset  . Mitral valve prolapse Father   . Hypertension Mother   . Colon polyps Mother   . Breast cancer Maternal Aunt 40       deceased from breast cancer  . Lung cancer Paternal Grandfather   . Mitral valve prolapse Paternal Aunt   . Lung cancer Maternal Grandfather   . Mitral valve prolapse Paternal Aunt   . Colon cancer Neg Hx      Past medical history, social, surgical and family history all reviewed in electronic medical record.  No pertanent information unless stated regarding to the chief complaint.   Review of Systems:Review of systems updated and as accurate as of 03/16/18  No headache, visual changes, nausea, vomiting,  diarrhea, constipation, dizziness, abdominal pain, skin rash, fevers, chills, night sweats, weight loss, swollen lymph nodes, body aches, joint swelling, muscle aches, chest pain, shortness of breath, mood changes.   Objective  Blood pressure 138/80, pulse (!) 115, height 5\' 10"  (1.778 m), weight 204 lb (92.5 kg), SpO2 98 %. Systems examined below as of 03/16/18   General: No apparent distress alert and oriented x3 mood and affect normal, dressed appropriately.  HEENT: Pupils equal, extraocular movements intact  Respiratory: Patient's speak in full sentences and does not appear short of breath  Cardiovascular: No lower extremity edema, non tender, no erythema  Skin: Warm dry intact with no signs of infection or rash on extremities or on axial skeleton.  Abdomen: Soft nontender  Neuro: Cranial nerves II through XII are intact, neurovascularly intact in all extremities with 2+ DTRs and 2+  pulses.  Lymph: No lymphadenopathy of posterior or anterior cervical chain or axillae bilaterally.  Gait normal with good balance and coordination.  MSK:  Non tender with full range of motion and good stability and symmetric strength and tone of shoulders, elbows, wrist, knee and ankles bilaterally.  Hip: Right ROM IR: 25 Deg, ER: 35 Deg, Flexion: 120 Deg, Extension: 100 Deg, Abduction: 45 Deg, Adduction: 25 Deg Strength IR: 5/5, ER: 5/5, Flexion: 5/5, Extension: 5/5, Abduction: 5/5, Adduction: 5/5 Pelvic alignment unremarkable to inspection and palpation. Standing hip rotation and gait without trendelenburg sign / unsteadiness. Greater trochanter without tenderness to palpation. Positive Faber Positive SI joint  Osteopathic findings  T5 extended rotated and side bent left L4 flexed rotated and side bent right Sacrum right on right     Impression and Recommendations:     This case required medical decision making of moderate complexity.      Note: This dictation was prepared with Dragon dictation along with smaller phrase technology. Any transcriptional errors that result from this process are unintentional.

## 2018-03-16 ENCOUNTER — Encounter: Payer: Self-pay | Admitting: Family Medicine

## 2018-03-16 ENCOUNTER — Ambulatory Visit (INDEPENDENT_AMBULATORY_CARE_PROVIDER_SITE_OTHER): Payer: Managed Care, Other (non HMO) | Admitting: Family Medicine

## 2018-03-16 VITALS — BP 138/80 | HR 115 | Ht 70.0 in | Wt 204.0 lb

## 2018-03-16 DIAGNOSIS — M999 Biomechanical lesion, unspecified: Secondary | ICD-10-CM

## 2018-03-16 DIAGNOSIS — S73191D Other sprain of right hip, subsequent encounter: Secondary | ICD-10-CM | POA: Diagnosis not present

## 2018-03-16 NOTE — Assessment & Plan Note (Signed)
Significant improvement already in 3 weeks.  Continue the return to progression.  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  Follow-up with me again in 3 to 4 weeks

## 2018-03-16 NOTE — Patient Instructions (Signed)
Good to see you  Keep it up! You are making great progress See me again in 4 weeks!

## 2018-03-16 NOTE — Assessment & Plan Note (Signed)
Decision today to treat with OMT was based on Physical Exam  After verbal consent patient was treated with HVLA, ME, FPR techniques in  thoracic, lumbar and sacral areas  Patient tolerated the procedure well with improvement in symptoms  Patient given exercises, stretches and lifestyle modifications  See medications in patient instructions if given  Patient will follow up in 3-4 weeks 

## 2018-03-19 ENCOUNTER — Ambulatory Visit: Payer: 59 | Admitting: Psychology

## 2018-03-26 ENCOUNTER — Ambulatory Visit: Payer: 59 | Admitting: Psychology

## 2018-04-01 ENCOUNTER — Ambulatory Visit (INDEPENDENT_AMBULATORY_CARE_PROVIDER_SITE_OTHER): Payer: 59 | Admitting: Psychology

## 2018-04-01 DIAGNOSIS — F4310 Post-traumatic stress disorder, unspecified: Secondary | ICD-10-CM

## 2018-04-02 ENCOUNTER — Ambulatory Visit: Payer: Managed Care, Other (non HMO) | Admitting: Psychology

## 2018-04-08 ENCOUNTER — Ambulatory Visit (INDEPENDENT_AMBULATORY_CARE_PROVIDER_SITE_OTHER): Payer: 59 | Admitting: Psychology

## 2018-04-08 DIAGNOSIS — F4310 Post-traumatic stress disorder, unspecified: Secondary | ICD-10-CM

## 2018-04-09 ENCOUNTER — Ambulatory Visit: Payer: 59 | Admitting: Psychology

## 2018-04-14 ENCOUNTER — Ambulatory Visit (INDEPENDENT_AMBULATORY_CARE_PROVIDER_SITE_OTHER): Payer: 59 | Admitting: Psychology

## 2018-04-14 DIAGNOSIS — F4310 Post-traumatic stress disorder, unspecified: Secondary | ICD-10-CM

## 2018-04-15 ENCOUNTER — Ambulatory Visit: Payer: Self-pay | Admitting: Family Medicine

## 2018-04-15 ENCOUNTER — Encounter: Payer: Self-pay | Admitting: Family Medicine

## 2018-04-15 ENCOUNTER — Ambulatory Visit (INDEPENDENT_AMBULATORY_CARE_PROVIDER_SITE_OTHER): Payer: Managed Care, Other (non HMO) | Admitting: Family Medicine

## 2018-04-15 VITALS — BP 130/88 | HR 127 | Ht 70.0 in | Wt 204.0 lb

## 2018-04-15 DIAGNOSIS — M999 Biomechanical lesion, unspecified: Secondary | ICD-10-CM

## 2018-04-15 DIAGNOSIS — S73191D Other sprain of right hip, subsequent encounter: Secondary | ICD-10-CM | POA: Diagnosis not present

## 2018-04-15 NOTE — Assessment & Plan Note (Signed)
Worsening pain again.  Discussed icing regimen.  Discussed repeating the PRP again.  Also discussed the possibility of surgical intervention.  Follow-up again in 4 weeks

## 2018-04-15 NOTE — Progress Notes (Signed)
[facility] [address] [place], [place] [address] Phone: [phone] Subjective:   Robyn Long, am serving as a scribe for Dr. Antoine Long.   CC: Right-sided hip pain follow-up  AST:[id number]  Robyn Long is a 32 y.o. female coming in with complaint of hip pain, right side. Patient states that she is wondering if she is going to have ongoing pain. 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 Recently she started to have pain when she stands for long periods of time. Any activity that is prolonged seems to cause soreness. Sharp pain with shifting her weight onto the right leg. No pain with lying down as previously felt.  Patient was found to have a labral tear.  Given a steroid injection initially that was beneficial and then had PRP.  Was improving.  The injection was 2 months ago.  The pain seems to be coming back slowly though.      Past Medical History:  Diagnosis Date  . Abnormal Pap smear of cervix 2014  . Allergy   . Arthritis   . Depression   . History of chicken pox    Past Surgical History:  Procedure Laterality Date  . CRYOTHERAPY  2014   for abnormal pap smear   Social History   Socioeconomic History  . Marital status: Married    Spouse name: Not on file  . Number of children: Not on file  . Years of education: 16+  . Highest education level: Not on file  Occupational History  . Occupation: Associate Professor: [employer]  Social Needs  . Financial resource strain: Not on file  . Food insecurity:    Worry: Not on file    Inability: Not on file  . Transportation needs:    Medical: Not on file    Non-medical: Not on file  Tobacco Use  . Smoking status: Never Smoker  . Smokeless tobacco: Never Used  Substance and Sexual Activity  . Alcohol use: Yes    Alcohol/week: 3.0 - 4.0 standard drinks    Types: 3 - 4 Standard drinks or equivalent per week  . Drug use: No  . Sexual  activity: Yes    Partners: Male    Birth control/protection: Condom  Lifestyle  . Physical activity:    Days per week: Not on file    Minutes per session: Not on file  . Stress: Not on file  Relationships  . Social connections:    Talks on phone: Not on file    Gets together: Not on file    Attends religious service: Not on file    Active member of club or organization: Not on file    Attends meetings of clubs or organizations: Not on file    Relationship status: Not on file  Other Topics Concern  . Not on file  Social History Narrative   Caffeine Use-yes   Regular exercise-yes   No Known Allergies Family History  Problem Relation Age of Onset  . Mitral valve prolapse Father   . Hypertension Mother   . Colon polyps Mother   . Breast cancer Maternal Aunt 40       deceased from breast cancer  . Lung cancer Paternal Grandfather   . Mitral valve prolapse Paternal Aunt   . Lung cancer Maternal Grandfather   . Mitral valve prolapse Paternal Aunt   . Colon cancer Neg Hx  Current Outpatient Medications (Other):  Marland Kitchen  Diclofenac Sodium (PENNSAID) 2 % SOLN, Place 2 application 2 (two) times daily onto the skin. Marland Kitchen  glucosamine-chondroitin 500-400 MG tablet, Take 1 tablet by mouth daily. .  hydrOXYzine (ATARAX/VISTARIL) 10 MG tablet, Take 1 tablet (10 mg total) by mouth 3 (three) times daily as needed. .  Multiple Vitamins-Minerals (WOMENS MULTIVITAMIN PLUS) TABS, Take 1 tablet by mouth daily.    Past medical history, social, surgical and family history all reviewed in electronic medical record.  No pertanent information unless stated regarding to the chief complaint.   Review of Systems:  No headache, visual changes, nausea, vomiting, diarrhea, constipation, dizziness, abdominal pain, skin rash, fevers, chills, night sweats, weight loss, swollen lymph nodes, body aches, joint swelling, muscle aches, chest pain, shortness of breath, mood changes.   Objective  Blood  pressure 130/88, pulse (!) 127, height 5\' 10"  (1.778 m), weight 204 lb (92.5 kg), SpO2 97 %.    General: No apparent distress alert and oriented x3 mood and affect normal, dressed appropriately.  HEENT: Pupils equal, extraocular movements intact  Respiratory: Patient's speak in full sentences and does not appear short of breath  Cardiovascular: No lower extremity edema, non tender, no erythema  Skin: Warm dry intact with no signs of infection or rash on extremities or on axial skeleton.  Abdomen: Soft nontender  Neuro: Cranial nerves II through XII are intact, neurovascularly intact in all extremities with 2+ DTRs and 2+ pulses.  Lymph: No lymphadenopathy of posterior or anterior cervical chain or axillae bilaterally.  Gait normal with good balance and coordination.  MSK:  Non tender with full range of motion and good stability and symmetric strength and tone of shoulders, elbows, wrist, knee and ankles bilaterally.  Right hip does have pain with internal range of motion.  Patient does have some mild limitation.  Positive grind test.  Tightness of the lower back noted.  Tender to palpation the paraspinal musculature lumbar spine.  Osteopathic findings C2 flexed rotated and side bent right C6 flexed rotated and side bent left T3 extended rotated and side bent right inhaled third rib L2 flexed rotated and side bent right Sacrum right on right     Impression and Recommendations:     This case required medical decision making of moderate complexity. The above documentation has been reviewed and is accurate and complete Robyn Saa, DO       Note: This dictation was prepared with Dragon dictation along with smaller phrase technology. Any transcriptional errors that result from this process are unintentional.

## 2018-04-15 NOTE — Patient Instructions (Signed)
Good to see you  Robyn Long is yur friend.  I am sorry you are hurting.  Consider either another injection in the hip, PRP again or maybe discuss surgical intervention  See me again in 6 weeks

## 2018-04-15 NOTE — Assessment & Plan Note (Signed)
Decision today to treat with OMT was based on Physical Exam  After verbal consent patient was treated with HVLA, ME, FPR techniques in cervical, thoracic, lumbar and sacral areas  Patient tolerated the procedure well with improvement in symptoms  Patient given exercises, stretches and lifestyle modifications  See medications in patient instructions if given  Patient will follow up in 6 weeks 

## 2018-04-16 ENCOUNTER — Ambulatory Visit: Payer: 59 | Admitting: Psychology

## 2018-04-22 ENCOUNTER — Ambulatory Visit (INDEPENDENT_AMBULATORY_CARE_PROVIDER_SITE_OTHER): Payer: 59 | Admitting: Psychology

## 2018-04-22 DIAGNOSIS — F4310 Post-traumatic stress disorder, unspecified: Secondary | ICD-10-CM | POA: Diagnosis not present

## 2018-04-23 ENCOUNTER — Ambulatory Visit: Payer: 59 | Admitting: Psychology

## 2018-04-29 ENCOUNTER — Ambulatory Visit (INDEPENDENT_AMBULATORY_CARE_PROVIDER_SITE_OTHER): Payer: 59 | Admitting: Psychology

## 2018-04-29 DIAGNOSIS — F4310 Post-traumatic stress disorder, unspecified: Secondary | ICD-10-CM

## 2018-04-30 ENCOUNTER — Ambulatory Visit: Payer: 59 | Admitting: Psychology

## 2018-05-06 ENCOUNTER — Ambulatory Visit (INDEPENDENT_AMBULATORY_CARE_PROVIDER_SITE_OTHER): Payer: 59 | Admitting: Psychology

## 2018-05-06 DIAGNOSIS — F4310 Post-traumatic stress disorder, unspecified: Secondary | ICD-10-CM

## 2018-05-07 ENCOUNTER — Ambulatory Visit: Payer: 59 | Admitting: Psychology

## 2018-05-11 ENCOUNTER — Ambulatory Visit (INDEPENDENT_AMBULATORY_CARE_PROVIDER_SITE_OTHER): Payer: 59 | Admitting: Psychology

## 2018-05-11 DIAGNOSIS — F4310 Post-traumatic stress disorder, unspecified: Secondary | ICD-10-CM

## 2018-05-14 ENCOUNTER — Ambulatory Visit: Payer: 59 | Admitting: Psychology

## 2018-05-20 ENCOUNTER — Ambulatory Visit: Payer: 59 | Admitting: Psychology

## 2018-05-21 ENCOUNTER — Ambulatory Visit: Payer: 59 | Admitting: Psychology

## 2018-05-25 ENCOUNTER — Ambulatory Visit: Payer: Self-pay | Admitting: Family Medicine

## 2018-05-26 ENCOUNTER — Ambulatory Visit (INDEPENDENT_AMBULATORY_CARE_PROVIDER_SITE_OTHER): Payer: Managed Care, Other (non HMO) | Admitting: Family Medicine

## 2018-05-26 ENCOUNTER — Encounter (INDEPENDENT_AMBULATORY_CARE_PROVIDER_SITE_OTHER): Payer: Self-pay | Admitting: Family Medicine

## 2018-05-26 ENCOUNTER — Ambulatory Visit (INDEPENDENT_AMBULATORY_CARE_PROVIDER_SITE_OTHER): Payer: Managed Care, Other (non HMO)

## 2018-05-26 DIAGNOSIS — M25511 Pain in right shoulder: Secondary | ICD-10-CM

## 2018-05-26 DIAGNOSIS — G8929 Other chronic pain: Secondary | ICD-10-CM | POA: Diagnosis not present

## 2018-05-26 MED ORDER — GABAPENTIN 300 MG PO CAPS: ORAL_CAPSULE | ORAL | 6 refills | Status: DC

## 2018-05-26 NOTE — Patient Instructions (Signed)
    Vitamin D3:  5,000 IU daily  Magnesium:  400 mg at bed time  L-Theanine:  100 mg 2-3 times daily
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
## 2018-05-28 ENCOUNTER — Ambulatory Visit: Payer: 59 | Admitting: Psychology

## 2018-06-04 ENCOUNTER — Ambulatory Visit: Payer: 59 | Admitting: Psychology

## 2018-06-11 ENCOUNTER — Ambulatory Visit (INDEPENDENT_AMBULATORY_CARE_PROVIDER_SITE_OTHER): Payer: 59 | Admitting: Psychology

## 2018-06-11 DIAGNOSIS — F332 Major depressive disorder, recurrent severe without psychotic features: Secondary | ICD-10-CM

## 2018-06-14 NOTE — Progress Notes (Signed)
[name] [address] [place], [place] [address] Phone: [phone] Subjective:    I'm seeing this patient by the request  of:    CC: Hip pain, right shoulder pain follow-up  LKG:[id number]  Robyn Long is a 32 y.o. female coming in with complaint of hip and back pain. Hip pain has improved since 1st PRP. Does still have a small amount of pain remaining in the hip. End range hip flexion and IR increase pain. Prolonged sitting to stand can also cause a catching sensation. Pain in the hip flexor and groin.  Lower back has responded fairly well to osteopathic manipulation.  Patient has been doing the exercises intermittently.  New onset right shoulder pain.  Has had more of a biceps subluxation previously.  Has though recently in the last 48 hours had severe amount of pain.  Had an audible pop.  Has had bruising.  Difficult to actually move arm in a regular basis.  Rates the severity pain is 7 out of 10     Past Medical History:  Diagnosis Date  . Abnormal Pap smear of cervix 2014  . Allergy   . Arthritis   . Depression   . History of chicken pox    Past Surgical History:  Procedure Laterality Date  . CRYOTHERAPY  2014   for abnormal pap smear   Social History   Socioeconomic History  . Marital status: Married    Spouse name: Not on file  . Number of children: Not on file  . Years of education: 16+  . Highest education level: Not on file  Occupational History  . Occupation: Associate Professor: [employer]  Social Needs  . Financial resource strain: Not on file  . Food insecurity:    Worry: Not on file    Inability: Not on file  . Transportation needs:    Medical: Not on file    Non-medical: Not on file  Tobacco Use  . Smoking status: Never Smoker  . Smokeless tobacco: Never Used  Substance and Sexual Activity  . Alcohol use: Yes    Alcohol/week: 3.0 - 4.0 standard drinks    Types: 3 - 4 Standard drinks or  equivalent per week  . Drug use: No  . Sexual activity: Yes    Partners: Male    Birth control/protection: Condom  Lifestyle  . Physical activity:    Days per week: Not on file    Minutes per session: Not on file  . Stress: Not on file  Relationships  . Social connections:    Talks on phone: Not on file    Gets together: Not on file    Attends religious service: Not on file    Active member of club or organization: Not on file    Attends meetings of clubs or organizations: Not on file    Relationship status: Not on file  Other Topics Concern  . Not on file  Social History Narrative   Caffeine Use-yes   Regular exercise-yes   No Known Allergies Family History  Problem Relation Age of Onset  . Mitral valve prolapse Father   . Hypertension Mother   . Colon polyps Mother   . Breast cancer Maternal Aunt 40       deceased from breast cancer  . Lung cancer Paternal Grandfather   . Mitral valve prolapse Paternal Aunt   . Lung cancer Maternal Grandfather   . Mitral valve prolapse Paternal  Aunt   . Colon cancer Neg Hx          Current Outpatient Medications (Other):  Marland Kitchen  Diclofenac Sodium (PENNSAID) 2 % SOLN, Place 2 application 2 (two) times daily onto the skin. Marland Kitchen  gabapentin (NEURONTIN) 300 MG capsule, 1 PO q HS, may increase to 1 PO BID if needed .  Glucosamine Sulfate (SYNOVACIN PO), Glucosamine  1 q day .  glucosamine-chondroitin 500-400 MG tablet, Take 1 tablet by mouth daily. .  hydrOXYzine (ATARAX/VISTARIL) 10 MG tablet, Take 1 tablet (10 mg total) by mouth 3 (three) times daily as needed. (Patient not taking: Reported on 05/26/2018) .  Multiple Vitamins-Minerals (WOMENS MULTIVITAMIN PLUS) TABS, Take 1 tablet by mouth daily.    Past medical history, social, surgical and family history all reviewed in electronic medical record.  No pertanent information unless stated regarding to the chief complaint.   Review of Systems:  No headache, visual changes, nausea,  vomiting, diarrhea, constipation, dizziness, abdominal pain, skin rash, fevers, chills, night sweats, weight loss, swollen lymph nodes, body aches, joint swelling, muscle aches, chest pain, shortness of breath, mood changes.   Objective  There were no vitals taken for this visit. Systems examined below as of    General: No apparent distress alert and oriented x3 mood and affect normal, dressed appropriately.  HEENT: Pupils equal, extraocular movements intact  Respiratory: Patient's speak in full sentences and does not appear short of breath  Cardiovascular: No lower extremity edema, non tender, no erythema  Skin: Warm dry intact with no signs of infection or rash on extremities or on axial skeleton.  Abdomen: Soft nontender  Neuro: Cranial nerves II through XII are intact, neurovascularly intact in all extremities with 2+ DTRs and 2+ pulses.  Lymph: No lymphadenopathy of posterior or anterior cervical chain or axillae bilaterally.  Gait normal with good balance and coordination.  MSK:  Non tender with full range of motion and good stability and symmetric strength and tone of , elbows, wrist,  knee and ankles bilaterally.  Shoulder: Right Inspection reveals bruising noted on the anterior aspect of the shoulder.  This is new. Palpation is normal with no tenderness over AC joint or bicipital groove. ROM is full in all planes.  There is some pain after 10 degrees of external rotation. Rotator cuff strength 4 out of 5 compared to the contralateral side No signs of impingement with negative Neer and Hawkin's tests, empty can sign. Speeds and Yergason's tests positive. Positive O'Brien Normal scapular function observed. Mild painful arc and no drop arm sign. No apprehension sign  Back exam shows some tightness in the parascapular region on the right side.  Pain in the thoracolumbar junction more on the right side in the paraspinal musculature as well.  Low back tightness around the right  sacroiliac joint.  Osteopathic findings C2 flexed rotated and side bent right T3 extended rotated and side bent right inhaled third rib T7 extended rotated and side bent left L4 flexed rotated and side bent right Sacrum right on right    Impression and Recommendations:     This case required medical decision making of moderate complexity. The above documentation has been reviewed and is accurate and complete Judi Saa, DO       Note: This dictation was prepared with Dragon dictation along with smaller phrase technology. Any transcriptional errors that result from this process are unintentional.

## 2018-06-15 ENCOUNTER — Encounter: Payer: Self-pay | Admitting: Family Medicine

## 2018-06-15 ENCOUNTER — Ambulatory Visit (INDEPENDENT_AMBULATORY_CARE_PROVIDER_SITE_OTHER): Payer: Managed Care, Other (non HMO) | Admitting: Family Medicine

## 2018-06-15 VITALS — BP 118/86 | HR 134 | Ht 70.0 in | Wt 204.0 lb

## 2018-06-15 DIAGNOSIS — G2589 Other specified extrapyramidal and movement disorders: Secondary | ICD-10-CM

## 2018-06-15 DIAGNOSIS — S73191D Other sprain of right hip, subsequent encounter: Secondary | ICD-10-CM | POA: Diagnosis not present

## 2018-06-15 DIAGNOSIS — M999 Biomechanical lesion, unspecified: Secondary | ICD-10-CM | POA: Diagnosis not present

## 2018-06-15 DIAGNOSIS — M25511 Pain in right shoulder: Secondary | ICD-10-CM | POA: Insufficient documentation

## 2018-06-15 MED ORDER — NITROGLYCERIN 0.2 MG/HR TD PT24: MEDICATED_PATCH | TRANSDERMAL | 1 refills | Status: DC

## 2018-06-15 MED ORDER — TRAMADOL HCL 50 MG PO TABS: 50.0000 mg | ORAL_TABLET | Freq: Two times a day (BID) | ORAL | 0 refills | Status: DC | PRN

## 2018-06-15 NOTE — Assessment & Plan Note (Signed)
Scapular dyskinesis.  Discussed icing regimen and home exercises.  Discussed which activities to do which wants to avoid.  Responded well to manipulation.  Follow-up again in 4 weeks

## 2018-06-15 NOTE — Assessment & Plan Note (Signed)
Acute on chronic.  Bruising noted.  Concern for a possible subluxation as well as potential tendon tear.  Patient was unable to tolerate a ultrasound today.  We will consider this and follow-up in 2 weeks.  Started on nitroglycerin.  Warned of potential side effects with patient has done this in the past.  Tramadol given for breakthrough pain.

## 2018-06-15 NOTE — Assessment & Plan Note (Signed)
Decision today to treat with OMT was based on Physical Exam  After verbal consent patient was treated with HVLA, ME, FPR techniques in  thoracic, lumbar and sacral areas  Patient tolerated the procedure well with improvement in symptoms  Patient given exercises, stretches and lifestyle modifications  See medications in patient instructions if given  Patient will follow up in 4 weeks 

## 2018-06-15 NOTE — Patient Instructions (Signed)
I am sorry  Ice 20 minutes 2 times daily. Usually after activity and before bed. Compression sleeve Nitro 1/4 patch  Stay activebut no lifting  Se eme again in 2 weeks Set up PRP in 4 weeks for hip and possibility the shoulder

## 2018-06-15 NOTE — Assessment & Plan Note (Signed)
Responded..  May consider a second injection.  Patient wants to discuss this again in 1 month.

## 2018-06-18 ENCOUNTER — Ambulatory Visit (INDEPENDENT_AMBULATORY_CARE_PROVIDER_SITE_OTHER): Payer: Managed Care, Other (non HMO) | Admitting: Psychology

## 2018-06-18 DIAGNOSIS — F332 Major depressive disorder, recurrent severe without psychotic features: Secondary | ICD-10-CM

## 2018-06-25 ENCOUNTER — Ambulatory Visit (INDEPENDENT_AMBULATORY_CARE_PROVIDER_SITE_OTHER): Payer: Managed Care, Other (non HMO) | Admitting: Psychology

## 2018-06-25 DIAGNOSIS — F332 Major depressive disorder, recurrent severe without psychotic features: Secondary | ICD-10-CM

## 2018-07-01 ENCOUNTER — Ambulatory Visit: Payer: Self-pay

## 2018-07-01 ENCOUNTER — Ambulatory Visit (INDEPENDENT_AMBULATORY_CARE_PROVIDER_SITE_OTHER): Payer: Managed Care, Other (non HMO) | Admitting: Family Medicine

## 2018-07-01 ENCOUNTER — Encounter: Payer: Self-pay | Admitting: Family Medicine

## 2018-07-01 VITALS — BP 140/82 | HR 102 | Ht 70.0 in | Wt 203.0 lb

## 2018-07-01 DIAGNOSIS — G2589 Other specified extrapyramidal and movement disorders: Secondary | ICD-10-CM | POA: Diagnosis not present

## 2018-07-01 DIAGNOSIS — M25511 Pain in right shoulder: Secondary | ICD-10-CM | POA: Diagnosis not present

## 2018-07-01 DIAGNOSIS — S73191D Other sprain of right hip, subsequent encounter: Secondary | ICD-10-CM

## 2018-07-01 DIAGNOSIS — G8929 Other chronic pain: Secondary | ICD-10-CM

## 2018-07-01 DIAGNOSIS — M999 Biomechanical lesion, unspecified: Secondary | ICD-10-CM

## 2018-07-01 NOTE — Patient Instructions (Signed)
Good to see you  Ice is your friend Stay active See you soon for the hip

## 2018-07-01 NOTE — Assessment & Plan Note (Signed)
Decision today to treat with OMT was based on Physical Exam  After verbal consent patient was treated with HVLA, ME, FPR techniques in  thoracic, lumbar and sacral areas  Patient tolerated the procedure well with improvement in symptoms  Patient given exercises, stretches and lifestyle modifications  See medications in patient instructions if given  Patient will follow up in 4-6 weeks 

## 2018-07-01 NOTE — Assessment & Plan Note (Signed)
Responded well to manipulation again today.  Discussed icing regimen and home exercise.  Discussed topical anti-inflammatories.  Tramadol for breakthrough pain.  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  Follow-up with me again in 4 to 6 weeks

## 2018-07-01 NOTE — Assessment & Plan Note (Signed)
Coming back for repeat PRP in weeks

## 2018-07-01 NOTE — Progress Notes (Signed)
Robyn Long D.O. [facility] [address] [place], [place] [address] Phone: [phone] Subjective:    I Robyn Long am serving as a Neurosurgeonscribe for Dr. Antoine PrimasZachary Long.   CC: Shoulder pain, hip pain and back pain follow-up  [id number]:Subjective  Robyn Long is a 32 y.o. female coming in with complaint of shoulder pain. Shoulder is doing better. Patient states 85 to 90% better.  Still certain movements gives him trouble.  Has had difficulty with the bursitis previously.  Back pain seems to be a little more exacerbated recently.  Patient back pain seems to be stable.  Continues to try to teach yoga.  Continues to have some discomfort and pain from time to time.  Patient states that the hip still gives her trouble.  Considering another repeat PRP injection     Past Medical History:  Diagnosis Date  . Abnormal Pap smear of cervix 2014  . Allergy   . Arthritis   . Depression   . History of chicken pox    Past Surgical History:  Procedure Laterality Date  . CRYOTHERAPY  2014   for abnormal pap smear   Social History   Socioeconomic History  . Marital status: Married    Spouse name: Not on file  . Number of children: Not on file  . Years of education: 16+  . Highest education level: Not on file  Occupational History  . Occupation: Associate Professorerver/Freelance Dancer    Employer: [employer]  Social Needs  . Financial resource strain: Not on file  . Food insecurity:    Worry: Not on file    Inability: Not on file  . Transportation needs:    Medical: Not on file    Non-medical: Not on file  Tobacco Use  . Smoking status: Never Smoker  . Smokeless tobacco: Never Used  Substance and Sexual Activity  . Alcohol use: Yes    Alcohol/week: 3.0 - 4.0 standard drinks    Types: 3 - 4 Standard drinks or equivalent per week  . Drug use: No  . Sexual activity: Yes    Partners: Male    Birth control/protection: Condom  Lifestyle  . Physical activity:    Days per week: Not on  file    Minutes per session: Not on file  . Stress: Not on file  Relationships  . Social connections:    Talks on phone: Not on file    Gets together: Not on file    Attends religious service: Not on file    Active member of club or organization: Not on file    Attends meetings of clubs or organizations: Not on file    Relationship status: Not on file  Other Topics Concern  . Not on file  Social History Narrative   Caffeine Use-yes   Regular exercise-yes   No Known Allergies Family History  Problem Relation Age of Onset  . Mitral valve prolapse Father   . Hypertension Mother   . Colon polyps Mother   . Breast cancer Maternal Aunt 40       deceased from breast cancer  . Lung cancer Paternal Grandfather   . Mitral valve prolapse Paternal Aunt   . Lung cancer Maternal Grandfather   . Mitral valve prolapse Paternal Aunt   . Colon cancer Neg Hx      Current Outpatient Medications (Cardiovascular):  .  nitroGLYCERIN (NITRODUR - DOSED IN MG/24 HR) 0.2 mg/hr patch, 1/4 patch daily   Current Outpatient Medications (Analgesics):  .  traMADol (ULTRAM) 50 MG tablet, Take 1 tablet (50 mg total) by mouth every 12 (twelve) hours as needed.   Current Outpatient Medications (Other):  Marland Kitchen  Diclofenac Sodium (PENNSAID) 2 % SOLN, Place 2 application 2 (two) times daily onto the skin. Marland Kitchen  gabapentin (NEURONTIN) 300 MG capsule, 1 PO q HS, may increase to 1 PO BID if needed .  Glucosamine Sulfate (SYNOVACIN PO), Glucosamine  1 q day .  glucosamine-chondroitin 500-400 MG tablet, Take 1 tablet by mouth daily. .  hydrOXYzine (ATARAX/VISTARIL) 10 MG tablet, Take 1 tablet (10 mg total) by mouth 3 (three) times daily as needed. .  Multiple Vitamins-Minerals (WOMENS MULTIVITAMIN PLUS) TABS, Take 1 tablet by mouth daily.    Past medical history, social, surgical and family history all reviewed in electronic medical record.  No pertanent information unless stated regarding to the chief complaint.    Review of Systems:  No headache, visual changes, nausea, vomiting, diarrhea, constipation, dizziness, abdominal pain, skin rash, fevers, chills, night sweats, weight loss, swollen lymph nodes, body aches, joint swelling,  chest pain, shortness of breath, mood changes.  Positive muscle aches  Objective  Blood pressure 140/82, pulse (!) 102, height 5\' 10"  (1.778 m), weight 203 lb (92.1 kg), SpO2 98 %.    General: No apparent distress alert and oriented x3 mood and affect normal, dressed appropriately.  HEENT: Pupils equal, extraocular movements intact  Respiratory: Patient's speak in full sentences and does not appear short of breath  Cardiovascular: No lower extremity edema, non tender, no erythema  Skin: Warm dry intact with no signs of infection or rash on extremities or on axial skeleton.  Abdomen: Soft nontender  Neuro: Cranial nerves II through XII are intact, neurovascularly intact in all extremities with 2+ DTRs and 2+ pulses.  Lymph: No lymphadenopathy of posterior or anterior cervical chain or axillae bilaterally.  Gait normal with good balance and coordination.  MSK:  Non tender with full range of motion and good stability and symmetric strength and tone of , elbows, wrist, , knee and ankles bilaterally.  Right hip exam shows the patient does have pain with internal rotation of the hip otherwise fairly unremarkable  Shoulder: Right Inspection reveals no abnormalities, atrophy or asymmetry. Palpation is normal with no tenderness over AC joint or bicipital groove. ROM is full in all planes. Rotator cuff strength normal throughout. Positive impingement Speeds and Yergason's tests positive. No labral pathology noted with negative Obrien's, negative clunk and good stability. Scapular dyskinesis No painful arc and no drop arm sign. No apprehension sign Contralateral shoulder unremarkable  MSK US performed of: Right shoulder This study was ordered, performed, and interpreted by  Robyn Long D.O.  Shoulder:   Supraspinatus: Patient does have mild bursitis Infraspinatus:  = Mild bursitis noted Subscapularis:  Appears normal on Long and transverse views. Teres Minor:  Appears normal on Long and transverse views. AC joint:  Positive impingement Glenohumeral Joint:  Appears normal without effusion. Glenoid Labrum:  Intact without visualized tears. Biceps Tendon: Bicep tendon mild hypoechoic changes  Impression: Chronic biceps subluxation  Osteopathic findings C2 flexed rotated and side bent right C4 flexed rotated and side bent left C6 flexed rotated and side bent left T3 extended rotated and side bent right inhaled third rib T5 extended rotated and side bent left L2 flexed rotated and side bent right Sacrum right on right      Impression and Recommendations:     This case required medical decision making of moderate complexity. The  above documentation has been reviewed and is accurate and complete Robyn Pulley, DO       Note: This dictation was prepared with Dragon dictation along with smaller phrase technology. Any transcriptional errors that result from this process are unintentional.

## 2018-07-01 NOTE — Assessment & Plan Note (Signed)
Bicep tendinitis.  Discussed compression and icing regimen.  Follow-up again in 4 to 6 weeks

## 2018-07-02 ENCOUNTER — Ambulatory Visit (INDEPENDENT_AMBULATORY_CARE_PROVIDER_SITE_OTHER): Payer: Managed Care, Other (non HMO) | Admitting: Psychology

## 2018-07-02 ENCOUNTER — Encounter: Payer: Self-pay | Admitting: Family Medicine

## 2018-07-02 DIAGNOSIS — F332 Major depressive disorder, recurrent severe without psychotic features: Secondary | ICD-10-CM

## 2018-07-16 ENCOUNTER — Ambulatory Visit: Payer: Managed Care, Other (non HMO) | Admitting: Psychology

## 2018-07-19 ENCOUNTER — Ambulatory Visit (INDEPENDENT_AMBULATORY_CARE_PROVIDER_SITE_OTHER): Payer: Self-pay | Admitting: Family Medicine

## 2018-07-19 ENCOUNTER — Other Ambulatory Visit: Payer: Managed Care, Other (non HMO)

## 2018-07-19 ENCOUNTER — Ambulatory Visit: Payer: Self-pay

## 2018-07-19 VITALS — Ht 70.0 in

## 2018-07-19 DIAGNOSIS — G8929 Other chronic pain: Secondary | ICD-10-CM

## 2018-07-19 DIAGNOSIS — M25511 Pain in right shoulder: Principal | ICD-10-CM

## 2018-07-19 DIAGNOSIS — S73191D Other sprain of right hip, subsequent encounter: Secondary | ICD-10-CM

## 2018-07-19 NOTE — Patient Instructions (Signed)
Good to see you.  See me again in 3-4 weeks   

## 2018-07-19 NOTE — Assessment & Plan Note (Signed)
Repeat injection given today.  Tolerated the procedure well.  Discussed icing regimen and home exercise.  Discussed which activities to do which wants to avoid.  Follow-up again in 4 to 8 weeks

## 2018-07-19 NOTE — Progress Notes (Signed)
[name] [address] [place], [place] [address] Phone: [phone] Subjective:    I'm seeing this patient by the request  of:    CC: Right hip pain follow-up  [id number]  Robyn Long is a 32 y.o. female coming in with complaint of right hip pain.  Known labral tear.  Here for PRP injection.  Responded well to the first 1 but is having worsening symptoms again.     Past Medical History:  Diagnosis Date  . Abnormal Pap smear of cervix 2014  . Allergy   . Arthritis   . Depression   . History of chicken pox    Past Surgical History:  Procedure Laterality Date  . CRYOTHERAPY  2014   for abnormal pap smear   Social History   Socioeconomic History  . Marital status: Married    Spouse name: Not on file  . Number of children: Not on file  . Years of education: 16+  . Highest education level: Not on file  Occupational History  . Occupation: Associate Professor: [employer]  Social Needs  . Financial resource strain: Not on file  . Food insecurity:    Worry: Not on file    Inability: Not on file  . Transportation needs:    Medical: Not on file    Non-medical: Not on file  Tobacco Use  . Smoking status: Never Smoker  . Smokeless tobacco: Never Used  Substance and Sexual Activity  . Alcohol use: Yes    Alcohol/week: 3.0 - 4.0 standard drinks    Types: 3 - 4 Standard drinks or equivalent per week  . Drug use: No  . Sexual activity: Yes    Partners: Male    Birth control/protection: Condom  Lifestyle  . Physical activity:    Days per week: Not on file    Minutes per session: Not on file  . Stress: Not on file  Relationships  . Social connections:    Talks on phone: Not on file    Gets together: Not on file    Attends religious service: Not on file    Active member of club or organization: Not on file    Attends meetings of clubs or organizations: Not on file    Relationship status: Not on file  Other  Topics Concern  . Not on file  Social History Narrative   Caffeine Use-yes   Regular exercise-yes   No Known Allergies Family History  Problem Relation Age of Onset  . Mitral valve prolapse Father   . Hypertension Mother   . Colon polyps Mother   . Breast cancer Maternal Aunt 40       deceased from breast cancer  . Lung cancer Paternal Grandfather   . Mitral valve prolapse Paternal Aunt   . Lung cancer Maternal Grandfather   . Mitral valve prolapse Paternal Aunt   . Colon cancer Neg Hx      Current Outpatient Medications (Cardiovascular):  .  nitroGLYCERIN (NITRODUR - DOSED IN MG/24 HR) 0.2 mg/hr patch, 1/4 patch daily   Current Outpatient Medications (Analgesics):  .  traMADol (ULTRAM) 50 MG tablet, Take 1 tablet (50 mg total) by mouth every 12 (twelve) hours as needed.   Current Outpatient Medications (Other):  Marland Kitchen  Diclofenac Sodium (PENNSAID) 2 % SOLN, Place 2 application 2 (two) times daily onto the skin. Marland Kitchen  gabapentin (NEURONTIN) 300 MG capsule, 1 PO q HS, may increase to 1  PO BID if needed .  Glucosamine Sulfate (SYNOVACIN PO), Glucosamine  1 q day .  glucosamine-chondroitin 500-400 MG tablet, Take 1 tablet by mouth daily. .  hydrOXYzine (ATARAX/VISTARIL) 10 MG tablet, Take 1 tablet (10 mg total) by mouth 3 (three) times daily as needed. .  Multiple Vitamins-Minerals (WOMENS MULTIVITAMIN PLUS) TABS, Take 1 tablet by mouth daily.  Procedure note  Procedure: Real-time Ultrasound Guided Injection of right hip Device: GE Logiq Q7  Ultrasound guided injection is preferred based studies that show increased duration, increased effect, greater accuracy, decreased procedural pain, increased response rate with ultrasound guided versus blind injection.  Verbal informed consent obtained.  Time-out conducted.  Noted no overlying erythema, induration, or other signs of local infection.  Skin prepped in a sterile fashion.  Local anesthesia: Topical Ethyl chloride.  With sterile  technique and under real time ultrasound guidance:  Anterior capsule visualized, needle visualized going to the head neck junction at the anterior capsule. Pictures taken. Patient did have injection of  3 cc of 0.5% Marcaine, and then 4 cc of pre-centrifuge PRP completed without difficulty  Pain immediately resolved suggesting accurate placement of the medication.  Advised to call if fevers/chills, erythema, induration, drainage, or persistent bleeding.  Images permanently stored and available for review in the ultrasound unit.  Impression: Technically successful ultrasound guided injection.    Impression and Recommendations:     This case required medical decision making of moderate complexity. The above documentation has been reviewed and is accurate and complete Judi SaaZachary M Helma Argyle, DO       Note: This dictation was prepared with Dragon dictation along with smaller phrase technology. Any transcriptional errors that result from this process are unintentional.

## 2018-07-23 ENCOUNTER — Ambulatory Visit (INDEPENDENT_AMBULATORY_CARE_PROVIDER_SITE_OTHER): Payer: 59 | Admitting: Psychology

## 2018-07-23 DIAGNOSIS — F332 Major depressive disorder, recurrent severe without psychotic features: Secondary | ICD-10-CM | POA: Diagnosis not present

## 2018-07-30 ENCOUNTER — Ambulatory Visit: Payer: Managed Care, Other (non HMO) | Admitting: Psychology

## 2018-08-06 ENCOUNTER — Ambulatory Visit: Payer: Managed Care, Other (non HMO) | Admitting: Psychology

## 2018-08-13 ENCOUNTER — Ambulatory Visit: Payer: Managed Care, Other (non HMO) | Admitting: Psychology

## 2018-08-17 NOTE — Progress Notes (Signed)
[name] [address], [place] [address] Phone: [phone] Subjective:   Bruce Donath, am serving as a scribe for Dr. Antoine Primas.   CC: Right shoulder and right hip pain follow-up  [id number]  Robyn Long is a 33 y.o. female coming in with complaint of right shoulder and right hip pain. Had injection in shoulder last visit. States that her shoulder is doing much better. Does still have some tingling in the shoulder but overall is happy with her progress.   Patient has ongoing right hip pain. Classes start next week so she has not been up on her feet as much using her hip. States that her hip pain is better than what it was but she is anxious to see how her hip will feel with movement next week.  Patient is known labral tear low.  Has been doing relatively well though.  Feeling better overall.  Has not been actually in the home pushing it yet.      Past Medical History:  Diagnosis Date  . Abnormal Pap smear of cervix 2014  . Allergy   . Arthritis   . Depression   . History of chicken pox    Past Surgical History:  Procedure Laterality Date  . CRYOTHERAPY  2014   for abnormal pap smear   Social History   Socioeconomic History  . Marital status: Married    Spouse name: Not on file  . Number of children: Not on file  . Years of education: 16+  . Highest education level: Not on file  Occupational History  . Occupation: Associate Professor: [employer]  Social Needs  . Financial resource strain: Not on file  . Food insecurity:    Worry: Not on file    Inability: Not on file  . Transportation needs:    Medical: Not on file    Non-medical: Not on file  Tobacco Use  . Smoking status: Never Smoker  . Smokeless tobacco: Never Used  Substance and Sexual Activity  . Alcohol use: Yes    Alcohol/week: 3.0 - 4.0 standard drinks    Types: 3 - 4 Standard drinks or equivalent per week  . Drug use: No  .  Sexual activity: Yes    Partners: Male    Birth control/protection: Condom  Lifestyle  . Physical activity:    Days per week: Not on file    Minutes per session: Not on file  . Stress: Not on file  Relationships  . Social connections:    Talks on phone: Not on file    Gets together: Not on file    Attends religious service: Not on file    Active member of club or organization: Not on file    Attends meetings of clubs or organizations: Not on file    Relationship status: Not on file  Other Topics Concern  . Not on file  Social History Narrative   Caffeine Use-yes   Regular exercise-yes   No Known Allergies Family History  Problem Relation Age of Onset  . Mitral valve prolapse Father   . Hypertension Mother   . Colon polyps Mother   . Breast cancer Maternal Aunt 40       deceased from breast cancer  . Lung cancer Paternal Grandfather   . Mitral valve prolapse Paternal Aunt   . Lung cancer Maternal Grandfather   . Mitral valve prolapse Paternal Aunt   . Colon  cancer Neg Hx      Current Outpatient Medications (Cardiovascular):  .  nitroGLYCERIN (NITRODUR - DOSED IN MG/24 HR) 0.2 mg/hr patch, 1/4 patch daily   Current Outpatient Medications (Analgesics):  .  traMADol (ULTRAM) 50 MG tablet, Take 1 tablet (50 mg total) by mouth every 12 (twelve) hours as needed.   Current Outpatient Medications (Other):  Marland Kitchen.  Diclofenac Sodium (PENNSAID) 2 % SOLN, Place 2 application 2 (two) times daily onto the skin. Marland Kitchen.  gabapentin (NEURONTIN) 300 MG capsule, 1 PO q HS, may increase to 1 PO BID if needed .  Glucosamine Sulfate (SYNOVACIN PO), Glucosamine  1 q day .  glucosamine-chondroitin 500-400 MG tablet, Take 1 tablet by mouth daily. .  hydrOXYzine (ATARAX/VISTARIL) 10 MG tablet, Take 1 tablet (10 mg total) by mouth 3 (three) times daily as needed. .  Multiple Vitamins-Minerals (WOMENS MULTIVITAMIN PLUS) TABS, Take 1 tablet by mouth daily.    Past medical history, social, surgical  and family history all reviewed in electronic medical record.  No pertanent information unless stated regarding to the chief complaint.   Review of Systems:  No headache, visual changes, nausea, vomiting, diarrhea, constipation, dizziness, abdominal pain, skin rash, fevers, chills, night sweats, weight loss, swollen lymph nodes, body aches, joint swelling,chest pain, shortness of breath, mood changes.  Positive muscle aches  Objective  Blood pressure 132/88, pulse (!) 111, height 5\' 10"  (1.778 m), weight 201 lb (91.2 kg), SpO2 98 %.    General: No apparent distress alert and oriented x3 mood and affect normal, dressed appropriately.  HEENT: Pupils equal, extraocular movements intact  Respiratory: Patient's speak in full sentences and does not appear short of breath  Cardiovascular: No lower extremity edema, non tender, no erythema  Skin: Warm dry intact with no signs of infection or rash on extremities or on axial skeleton.  Abdomen: Soft nontender  Neuro: Cranial nerves II through XII are intact, neurovascularly intact in all extremities with 2+ DTRs and 2+ pulses.  Lymph: No lymphadenopathy of posterior or anterior cervical chain or axillae bilaterally.  Gait normal with good balance and coordination.  MSK:  Non tender with full range of motion and good stability and symmetric strength and tone of , elbows, wrist, , knee and ankles bilaterally.  Right shoulder exam shows the patient does have good range of motion.  Patient has very minimal impingement still noted.  Negative O'Brien's.  Full strength of the rotator cuff.  Right hip exam shows some mild impingement with internal rotation.  Positive grind test but otherwise fairly unremarkable.  Back exam shows loss of lordosis.  Some tightness on especially in the right sacroiliac joint.  Positive Faber test.  Negative straight leg test.  Limited musculoskeletal ultrasound was performed and interpreted by Robyn Long  Limited ultrasound  shows the patient does have good healing of the partial rotator cuff tear at the moment.  Overall actually significant improvement from previous exam.  No intrasubstance tearing noted.  No increasing in Doppler flow or any chronic changes at the moment.   Osteopathic findings  T9 extended rotated and side bent left L2 flexed rotated and side bent right Sacrum right on right    Impression and Recommendations:     This case required medical decision making of moderate complexity. The above documentation has been reviewed and is accurate and complete Robyn SaaZachary M Joseguadalupe Stan, DO       Note: This dictation was prepared with Dragon dictation along with smaller phrase technology. Any  transcriptional errors that result from this process are unintentional.

## 2018-08-18 ENCOUNTER — Ambulatory Visit: Payer: Self-pay

## 2018-08-18 ENCOUNTER — Encounter: Payer: Self-pay | Admitting: Family Medicine

## 2018-08-18 ENCOUNTER — Ambulatory Visit (INDEPENDENT_AMBULATORY_CARE_PROVIDER_SITE_OTHER): Payer: Managed Care, Other (non HMO) | Admitting: Family Medicine

## 2018-08-18 VITALS — BP 132/88 | HR 111 | Ht 70.0 in | Wt 201.0 lb

## 2018-08-18 DIAGNOSIS — S73191D Other sprain of right hip, subsequent encounter: Secondary | ICD-10-CM | POA: Diagnosis not present

## 2018-08-18 DIAGNOSIS — G8929 Other chronic pain: Secondary | ICD-10-CM

## 2018-08-18 DIAGNOSIS — M999 Biomechanical lesion, unspecified: Secondary | ICD-10-CM

## 2018-08-18 DIAGNOSIS — M25511 Pain in right shoulder: Principal | ICD-10-CM

## 2018-08-18 NOTE — Patient Instructions (Addendum)
Happy New Year!  Nexium daily for 2 weeks Stay active See me again in 5-6 weeks

## 2018-08-18 NOTE — Assessment & Plan Note (Signed)
Much improved again.  Still some mild positive grind.  If he has worsening pain with increasing activity will need to consider surgical intervention

## 2018-08-18 NOTE — Assessment & Plan Note (Signed)
Decision today to treat with OMT was based on Physical Exam  After verbal consent patient was treated with HVLA, ME, FPR techniques in  thoracic, lumbar and sacral areas  Patient tolerated the procedure well with improvement in symptoms  Patient given exercises, stretches and lifestyle modifications  See medications in patient instructions if given  Patient will follow up in 4-6 weeks 

## 2018-08-18 NOTE — Assessment & Plan Note (Signed)
Improvement at this time.  Doing very well.  No significant restrictions at this time.

## 2018-08-20 ENCOUNTER — Ambulatory Visit: Payer: Managed Care, Other (non HMO) | Admitting: Psychology

## 2018-08-27 ENCOUNTER — Ambulatory Visit (INDEPENDENT_AMBULATORY_CARE_PROVIDER_SITE_OTHER): Payer: 59 | Admitting: Psychology

## 2018-08-27 DIAGNOSIS — F332 Major depressive disorder, recurrent severe without psychotic features: Secondary | ICD-10-CM | POA: Diagnosis not present

## 2018-09-03 ENCOUNTER — Ambulatory Visit (INDEPENDENT_AMBULATORY_CARE_PROVIDER_SITE_OTHER): Payer: 59 | Admitting: Psychology

## 2018-09-03 DIAGNOSIS — F332 Major depressive disorder, recurrent severe without psychotic features: Secondary | ICD-10-CM | POA: Diagnosis not present

## 2018-09-10 ENCOUNTER — Ambulatory Visit: Payer: Managed Care, Other (non HMO) | Admitting: Psychology

## 2018-09-17 ENCOUNTER — Ambulatory Visit (INDEPENDENT_AMBULATORY_CARE_PROVIDER_SITE_OTHER): Payer: 59 | Admitting: Psychology

## 2018-09-17 DIAGNOSIS — F332 Major depressive disorder, recurrent severe without psychotic features: Secondary | ICD-10-CM

## 2018-09-21 NOTE — Progress Notes (Signed)
Robyn ScaleZach Imanni Long D.O. [facility] [address] [place], [place] [address] Phone: [phone] Subjective:      CC: back pain and hip pain follow up as well as right-sided shoulder pain  BJY:NWGNFAOZHYHPI:Subjective     Update 09/22/2018:  Robyn MelterMichele Long is a 33 y.o. female coming in with complaint of right shoulder and hip. She has had increase in hip pain with teaching. Pain was improved with PRP but has go up she states.  Patient states that now it seems to be just as bad as prior to the injection.  Patient did have a labral pathology noted as well.  Has been hoping to avoid  Is having dull constant pain in shoulder. Was doing PT and her pain decreased initially but has plateaued. Is feeling better than initial appointment but is not full healed.  Patient was found to have more of a bursitis at last exam.  Doing the home exercises and working physical therapy that does seem to be helpful but then the symptoms of the pain will come back in days after being seen.  Never without some discomfort.     Past Medical History:  Diagnosis Date  . Abnormal Pap smear of cervix 2014  . Allergy   . Arthritis   . Depression   . History of chicken pox    Past Surgical History:  Procedure Laterality Date  . CRYOTHERAPY  2014   for abnormal pap smear   Social History   Socioeconomic History  . Marital status: Married    Spouse name: Not on file  . Number of children: Not on file  . Years of education: 16+  . Highest education level: Not on file  Occupational History  . Occupation: Associate Professorerver/Freelance Dancer    Employer: [employer]  Social Needs  . Financial resource strain: Not on file  . Food insecurity:    Worry: Not on file    Inability: Not on file  . Transportation needs:    Medical: Not on file    Non-medical: Not on file  Tobacco Use  . Smoking status: Never Smoker  . Smokeless tobacco: Never Used  Substance and Sexual Activity  . Alcohol use: Yes    Alcohol/week: 3.0 - 4.0  standard drinks    Types: 3 - 4 Standard drinks or equivalent per week  . Drug use: No  . Sexual activity: Yes    Partners: Male    Birth control/protection: Condom  Lifestyle  . Physical activity:    Days per week: Not on file    Minutes per session: Not on file  . Stress: Not on file  Relationships  . Social connections:    Talks on phone: Not on file    Gets together: Not on file    Attends religious service: Not on file    Active member of club or organization: Not on file    Attends meetings of clubs or organizations: Not on file    Relationship status: Not on file  Other Topics Concern  . Not on file  Social History Narrative   Caffeine Use-yes   Regular exercise-yes   No Known Allergies Family History  Problem Relation Age of Onset  . Mitral valve prolapse Father   . Hypertension Mother   . Colon polyps Mother   . Breast cancer Maternal Aunt 40       deceased from breast cancer  . Lung cancer Paternal Grandfather   . Mitral valve prolapse Paternal Aunt   .  Lung cancer Maternal Grandfather   . Mitral valve prolapse Paternal Aunt   . Colon cancer Neg Hx      Current Outpatient Medications (Cardiovascular):  .  nitroGLYCERIN (NITRODUR - DOSED IN MG/24 HR) 0.2 mg/hr patch, 1/4 patch daily   Current Outpatient Medications (Analgesics):  .  traMADol (ULTRAM) 50 MG tablet, Take 1 tablet (50 mg total) by mouth every 12 (twelve) hours as needed.   Current Outpatient Medications (Other):  Marland Kitchen.  Diclofenac Sodium (PENNSAID) 2 % SOLN, Place 2 application 2 (two) times daily onto the skin. Marland Kitchen.  gabapentin (NEURONTIN) 300 MG capsule, 1 PO q HS, may increase to 1 PO BID if needed .  Glucosamine Sulfate (SYNOVACIN PO), Glucosamine  1 q day .  glucosamine-chondroitin 500-400 MG tablet, Take 1 tablet by mouth daily. .  hydrOXYzine (ATARAX/VISTARIL) 10 MG tablet, Take 1 tablet (10 mg total) by mouth 3 (three) times daily as needed. .  Multiple Vitamins-Minerals (WOMENS  MULTIVITAMIN PLUS) TABS, Take 1 tablet by mouth daily.    Past medical history, social, surgical and family history all reviewed in electronic medical record.  No pertanent information unless stated regarding to the chief complaint.   Review of Systems:  No headache, visual changes, nausea, vomiting, diarrhea, constipation, dizziness, abdominal pain, skin rash, fevers, chills, night sweats, weight loss, swollen lymph nodes, body aches, joint swelling, , chest pain, shortness of breath, mood changes.  Positive muscle aches  Objective  Blood pressure 118/76, pulse (!) 113, height 5\' 10"  (1.778 m), weight 202 lb (91.6 kg), SpO2 96 %.   General: No apparent distress alert and oriented x3 mood and affect normal, dressed appropriately.  HEENT: Pupils equal, extraocular movements intact  Respiratory: Patient's speak in full sentences and does not appear short of breath  Cardiovascular: No lower extremity edema, non tender, no erythema  Skin: Warm dry intact with no signs of infection or rash on extremities or on axial skeleton.  Abdomen: Soft nontender  Neuro: Cranial nerves II through XII are intact, neurovascularly intact in all extremities with 2+ DTRs and 2+ pulses.  Lymph: No lymphadenopathy of posterior or anterior cervical chain or axillae bilaterally.  Gait antalgic.  MSK:  Non tender with full range of motion and good stability and symmetric strength and tone of , elbows, wrist,  knee and ankles bilaterally.  Back Exam:  Inspection: Unremarkable  Motion: Flexion 45 deg, Extension 42 deg, Side Bending to 35 deg bilaterally,  Rotation to 35 deg bilaterally  SLR laying: Negative  XSLR laying: Negative  Palpable tenderness: Tender to palpation of the paraspinal musculature lumbar spine right greater than left. FABER: negative. Sensory change: Gross sensation intact to all lumbar and sacral dermatomes.  Reflexes: 2+ at both patellar tendons, 2+ at achilles tendons, Babinski's downgoing.    Strength at foot  Plantar-flexion: 5/5 Dorsi-flexion: 5/5 Eversion: 5/5 Inversion: 5/5  Leg strength  Quad: 5/5 Hamstring: 5/5 Hip flexor: 5/5 Hip abductors: 5/5  Gait unremarkable.  Hip: Right ROM He does have some limitation with internal range of motion.  Positive pain in the groin area.  Positive grind test.  Shoulder: Right Inspection reveals no abnormalities, atrophy or asymmetry. Palpation is normal with no tenderness over AC joint or bicipital groove. ROM is full in all planes passively. Rotator cuff strength normal throughout. signs of impingement with positive Neer and Hawkin's tests, but negative empty can sign. Speeds and Yergason's tests normal. No labral pathology noted with negative Obrien's, negative clunk and good stability. Normal  scapular function observed. No painful arc and no drop arm sign. No apprehension sign   Procedure: Real-time Ultrasound Guided Injection of right glenohumeral joint Device: GE Logiq E  Ultrasound guided injection is preferred based studies that show increased duration, increased effect, greater accuracy, decreased procedural pain, increased response rate with ultrasound guided versus blind injection.  Verbal informed consent obtained.  Time-out conducted.  Noted no overlying erythema, induration, or other signs of local infection.  Skin prepped in a sterile fashion.  Local anesthesia: Topical Ethyl chloride.  With sterile technique and under real time ultrasound guidance:  Joint visualized.  23g 1  inch needle inserted posterior approach. Pictures taken for needle placement. Patient did have injection of 2 cc of 1% lidocaine, 2 cc of 0.5% Marcaine, and 1.0 cc of Kenalog 40 mg/dL. Completed without difficulty  Pain immediately resolved suggesting accurate placement of the medication.  Advised to call if fevers/chills, erythema, induration, drainage, or persistent bleeding.  Images permanently stored and available for review in the  ultrasound unit.  Impression: Technically successful ultrasound guided injection.   Impression and Recommendations:     This case required medical decision making of moderate complexity. The above documentation has been reviewed and is accurate and complete Judi Saa, DO       Note: This dictation was prepared with Dragon dictation along with smaller phrase technology. Any transcriptional errors that result from this process are unintentional.

## 2018-09-22 ENCOUNTER — Ambulatory Visit: Payer: Self-pay

## 2018-09-22 ENCOUNTER — Encounter: Payer: Self-pay | Admitting: Family Medicine

## 2018-09-22 ENCOUNTER — Ambulatory Visit (INDEPENDENT_AMBULATORY_CARE_PROVIDER_SITE_OTHER): Payer: Managed Care, Other (non HMO) | Admitting: Family Medicine

## 2018-09-22 VITALS — BP 118/76 | HR 113 | Ht 70.0 in | Wt 202.0 lb

## 2018-09-22 DIAGNOSIS — G8929 Other chronic pain: Secondary | ICD-10-CM | POA: Diagnosis not present

## 2018-09-22 DIAGNOSIS — M25511 Pain in right shoulder: Principal | ICD-10-CM

## 2018-09-22 DIAGNOSIS — S73191D Other sprain of right hip, subsequent encounter: Secondary | ICD-10-CM | POA: Diagnosis not present

## 2018-09-22 DIAGNOSIS — M7551 Bursitis of right shoulder: Secondary | ICD-10-CM | POA: Diagnosis not present

## 2018-09-22 NOTE — Assessment & Plan Note (Signed)
Patient has failed steroid injections as well as 2 rounds of PRP.  Patient has had made significant improvement initially and then seemed to be worsening again. Believe at this point surgical and intervention would be the most beneficial for this individual.  We discussed icing regimen and home exercises, patient will continue this but will be referred for further evaluation for surgical intervention.

## 2018-09-22 NOTE — Patient Instructions (Addendum)
Good to see you  Ice is your friend  Injected the shoulder  I hope it helps  Stay active Dr. Caswell Corwin office will be calling you on the hip  See me again in 6-8 weeks

## 2018-09-22 NOTE — Assessment & Plan Note (Signed)
Patient given injection.  Near complete resolution of pain immediately.  I believe the patient should do well with conservative therapy.  Encourage patient to do the home exercises and we discussed different ergonomics throughout the day that could be beneficial.  Patient will follow-up with me again in 4 to 8 weeks

## 2018-09-24 ENCOUNTER — Ambulatory Visit: Payer: 59 | Admitting: Psychology

## 2018-10-01 ENCOUNTER — Ambulatory Visit (INDEPENDENT_AMBULATORY_CARE_PROVIDER_SITE_OTHER): Payer: 59 | Admitting: Psychology

## 2018-10-01 DIAGNOSIS — F332 Major depressive disorder, recurrent severe without psychotic features: Secondary | ICD-10-CM

## 2018-10-05 NOTE — Progress Notes (Signed)
[name] [address] [place], [place] [address] Phone: [phone] Subjective:    I'm seeing this patient by the request  of:    CC: Right shoulder pain follow-up  BOF:BPZWCHENID  Robyn Long is a 33 y.o. female coming in with complaint of chronic right shoulder pain.  Patient has scapular dyskinesis as well as an acute bursitis of the shoulder.  Possible mild abnormality noted of the labrum posteriorly.  Patient given injection.  Feels that the shoulder pain was intermittently better but continues to have pain down the scapula.  Seems to be of varying.  More radiation down the arm but no neck pain.     Past Medical History:  Diagnosis Date  . Abnormal Pap smear of cervix 2014  . Allergy   . Arthritis   . Depression   . History of chicken pox    Past Surgical History:  Procedure Laterality Date  . CRYOTHERAPY  2014   for abnormal pap smear   Social History   Socioeconomic History  . Marital status: Married    Spouse name: Not on file  . Number of children: Not on file  . Years of education: 16+  . Highest education level: Not on file  Occupational History  . Occupation: Associate Professor: [employer]  Social Needs  . Financial resource strain: Not on file  . Food insecurity:    Worry: Not on file    Inability: Not on file  . Transportation needs:    Medical: Not on file    Non-medical: Not on file  Tobacco Use  . Smoking status: Never Smoker  . Smokeless tobacco: Never Used  Substance and Sexual Activity  . Alcohol use: Yes    Alcohol/week: 3.0 - 4.0 standard drinks    Types: 3 - 4 Standard drinks or equivalent per week  . Drug use: No  . Sexual activity: Yes    Partners: Male    Birth control/protection: Condom  Lifestyle  . Physical activity:    Days per week: Not on file    Minutes per session: Not on file  . Stress: Not on file  Relationships  . Social connections:    Talks on phone: Not  on file    Gets together: Not on file    Attends religious service: Not on file    Active member of club or organization: Not on file    Attends meetings of clubs or organizations: Not on file    Relationship status: Not on file  Other Topics Concern  . Not on file  Social History Narrative   Caffeine Use-yes   Regular exercise-yes   No Known Allergies Family History  Problem Relation Age of Onset  . Mitral valve prolapse Father   . Hypertension Mother   . Colon polyps Mother   . Breast cancer Maternal Aunt 40       deceased from breast cancer  . Lung cancer Paternal Grandfather   . Mitral valve prolapse Paternal Aunt   . Lung cancer Maternal Grandfather   . Mitral valve prolapse Paternal Aunt   . Colon cancer Neg Hx      Current Outpatient Medications (Cardiovascular):  .  nitroGLYCERIN (NITRODUR - DOSED IN MG/24 HR) 0.2 mg/hr patch, 1/4 patch daily   Current Outpatient Medications (Analgesics):  .  traMADol (ULTRAM) 50 MG tablet, Take 1 tablet (50 mg total) by mouth every 12 (twelve) hours as needed.  Current Outpatient Medications (Other):  Marland Kitchen  Diclofenac Sodium (PENNSAID) 2 % SOLN, Place 2 application 2 (two) times daily onto the skin. Marland Kitchen  gabapentin (NEURONTIN) 300 MG capsule, 1 PO q HS, may increase to 1 PO BID if needed .  Glucosamine Sulfate (SYNOVACIN PO), Glucosamine  1 q day .  glucosamine-chondroitin 500-400 MG tablet, Take 1 tablet by mouth daily. .  hydrOXYzine (ATARAX/VISTARIL) 10 MG tablet, Take 1 tablet (10 mg total) by mouth 3 (three) times daily as needed. .  Multiple Vitamins-Minerals (WOMENS MULTIVITAMIN PLUS) TABS, Take 1 tablet by mouth daily.    Past medical history, social, surgical and family history all reviewed in electronic medical record.  No pertanent information unless stated regarding to the chief complaint.   Review of Systems:  No headache, visual changes, nausea, vomiting, diarrhea, constipation, dizziness, abdominal pain, skin rash,  fevers, chills, night sweats, weight loss, swollen lymph nodes, body aches, joint swelling,  chest pain, shortness of breath, mood changes.  Positive muscle aches  Objective  Blood pressure 130/78, pulse 85, resp. rate 16, height 5\' 10"  (1.778 m), weight 204 lb (92.5 kg), SpO2 96 %.    General: No apparent distress alert and oriented x3 mood and affect normal, dressed appropriately.  HEENT: Pupils equal, extraocular movements intact  Respiratory: Patient's speak in full sentences and does not appear short of breath  Cardiovascular: No lower extremity edema, non tender, no erythema  Skin: Warm dry intact with no signs of infection or rash on extremities or on axial skeleton.  Abdomen: Soft nontender  Neuro: Cranial nerves II through XII are intact, neurovascularly intact in all extremities with 2+ DTRs and 2+ pulses.  Lymph: No lymphadenopathy of posterior or anterior cervical chain or axillae bilaterally.  Gait normal with good balance and coordination.  MSK:  Non tender with full range of motion and good stability and symmetric strength and tone of  elbows, wrist, hip, knee and ankles bilaterally.  Shoulder: Right Inspection reveals no abnormalities, atrophy or asymmetry. Palpation is normal with no tenderness over AC joint or bicipital groove. ROM is full in all planes. Rotator cuff strength normal throughout. Mild positive impingement Speeds and Yergason's tests normal. O'Brien's positive Normal scapular function observed. Mild painful arc and no drop arm sign. No apprehension sign Tightness of the right trapezius with significant number of trigger points  Verbal consent patient was prepped with alcohol swabs and with a 25-gauge half inch needle injected and 4 distinct trigger points around the medial margin of the scapula.  Total of 3 cc of 0.5% Marcaine and 1 cc of Kenalog 40 mg/mL used.  Minimal blood loss.  Band-Aids placed.  Postinjection instructions given       Impression  and Recommendations:     This case required medical decision making of moderate complexity. The above documentation has been reviewed and is accurate and complete Judi Saa, DO       Note: This dictation was prepared with Dragon dictation along with smaller phrase technology. Any transcriptional errors that result from this process are unintentional.

## 2018-10-06 ENCOUNTER — Ambulatory Visit: Payer: Managed Care, Other (non HMO) | Admitting: Family Medicine

## 2018-10-06 ENCOUNTER — Encounter: Payer: Self-pay | Admitting: Family Medicine

## 2018-10-06 DIAGNOSIS — G2589 Other specified extrapyramidal and movement disorders: Secondary | ICD-10-CM

## 2018-10-06 DIAGNOSIS — M7551 Bursitis of right shoulder: Secondary | ICD-10-CM

## 2018-10-06 DIAGNOSIS — M25511 Pain in right shoulder: Secondary | ICD-10-CM

## 2018-10-06 NOTE — Patient Instructions (Signed)
Good to see you  Tried trigger point injections  Also will get MRI of the right shoulder  Call [phone] to schedule.  Ice is your friend Gabapentin nightly I want to hole on the prednisone to make sure the MRI is not artificially better  I will write you with the results and tell you next step  Maybe have an appointment set up in 4 weeks

## 2018-10-06 NOTE — Assessment & Plan Note (Addendum)
Trigger point.  Given today.  Responded fairly well. Discussed which activities to do which was to avoid.  Discussed which activities that there was also avoid.  I did not think that this was a long-term benefit.  MR arthrogram of the shoulder ordered to further evaluate.

## 2018-10-06 NOTE — Assessment & Plan Note (Signed)
Scapular dyskinesis.  Discussed which activities to do which was to avoid.  We will continue to monitor.  Likely not continuing to do all this pain though.  Patient's pain seems to be a little more intra-articular but did have trigger points as well.  Difficult to assess patient with other comorbidities occurring.

## 2018-10-06 NOTE — Assessment & Plan Note (Signed)
Right shoulder pain.  Pain is out of proportion.  Tried trigger point injections.  Some mild limited range of motion.  MRI ordered for and we will see if there is any labral pathology.  Patient is already considering surgical intervention for her hip.  We will see how patient responds.  Follow-up again after imaging and discuss further.

## 2018-10-08 ENCOUNTER — Ambulatory Visit: Payer: 59 | Admitting: Psychology

## 2018-10-12 ENCOUNTER — Telehealth: Payer: Self-pay | Admitting: *Deleted

## 2018-10-12 DIAGNOSIS — M25511 Pain in right shoulder: Secondary | ICD-10-CM

## 2018-10-12 NOTE — Telephone Encounter (Signed)
Order entered & sent to [facility].  

## 2018-10-12 NOTE — Telephone Encounter (Signed)
Copied from CRM [id number]. Topic: General - Other >> Oct 12, 2018  3:15 PM Mickel Baas B, NT wrote: Reason for CRM: Patient calling and states that it was discussed at her last visit with Dr Katrinka Blazing that he would order an MRI with [facility]. Patient states that they do not have the order. Would like to know if that could be ordered. Please advise.

## 2018-10-15 ENCOUNTER — Ambulatory Visit: Payer: 59 | Admitting: Psychology

## 2018-10-18 ENCOUNTER — Other Ambulatory Visit: Payer: Self-pay | Admitting: *Deleted

## 2018-10-18 DIAGNOSIS — M25511 Pain in right shoulder: Secondary | ICD-10-CM

## 2018-10-22 ENCOUNTER — Ambulatory Visit: Payer: 59 | Admitting: Psychology

## 2018-10-29 ENCOUNTER — Ambulatory Visit (INDEPENDENT_AMBULATORY_CARE_PROVIDER_SITE_OTHER): Payer: 59 | Admitting: Psychology

## 2018-10-29 DIAGNOSIS — F332 Major depressive disorder, recurrent severe without psychotic features: Secondary | ICD-10-CM

## 2018-10-29 IMAGING — XA DG FLUORO GUIDE NDL PLC/BX
1 series · 1 of 1 positions shown · non-contrast
Comparison: none

CLINICAL DATA: Right hip pain.

[Series 1: ortho adipose · 1 of 1 slices shown]
[im 1/1]
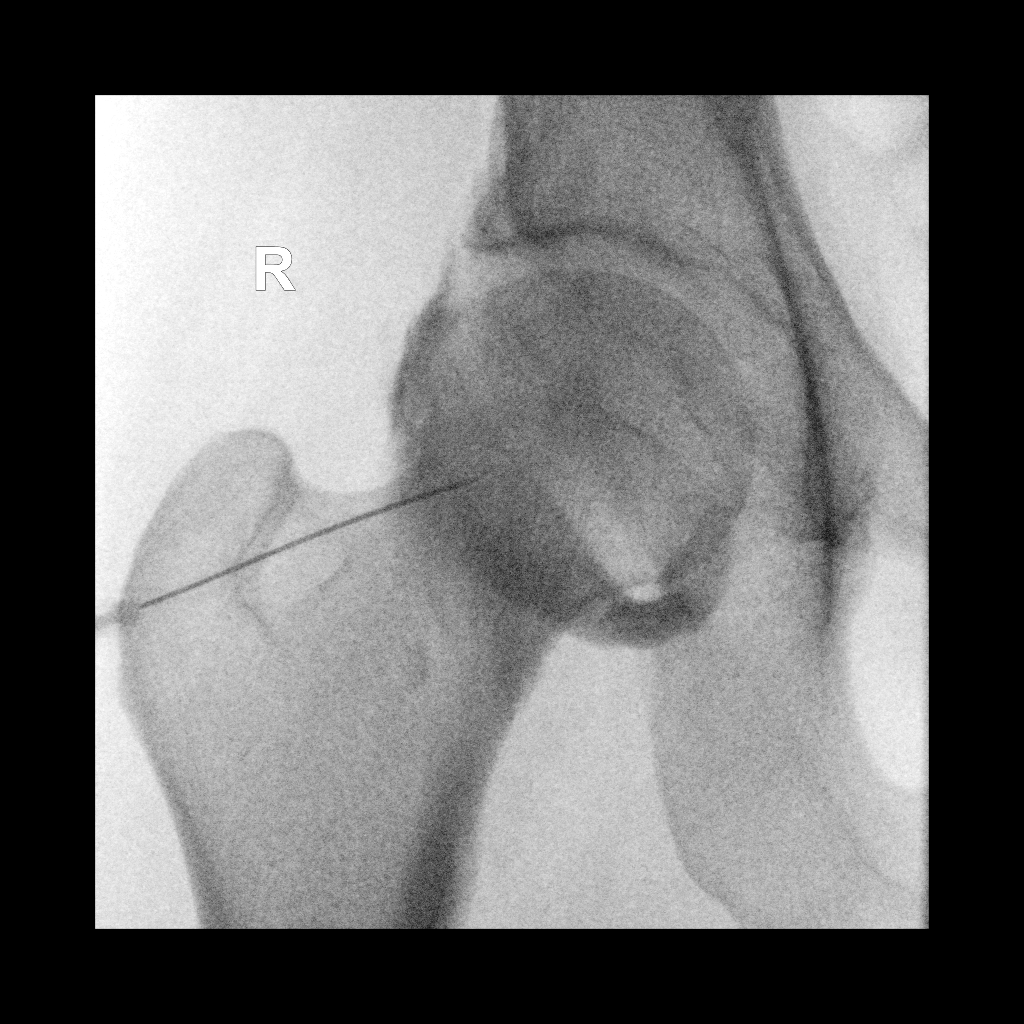

[1 of 1 positions shown; findings below may reference images not displayed]

EXAM:
RIGHT HIP INJECTION FOR MRI

FLUOROSCOPY TIME:  Fluoroscopy Time:  2 seconds

Radiation Exposure Index (if provided by the fluoroscopic device):
12.22 microGray*m^2

Number of Acquired Spot Images: 0

PROCEDURE:
Overlying skin prepped with Betadine, draped in the usual sterile
fashion, and infiltrated locally with 1% lidocaine. A 3.5 inch 22
gauge spinal needle was advanced to the lateral aspect of the right
femoral head-neck junction. 1 mL of 1% lidocaine injected easily. A
mixture of 0.1 mL of MultiHance, 5 mL of Isovue-M 200, and 15 mL of
sterile saline was then used to opacify the right femoral head. 11
mL of this mixture were injected. No immediate complication.
IMPRESSION: Technically successful right hip injection under fluoroscopy for MR
arthrogram.

## 2018-11-05 ENCOUNTER — Ambulatory Visit (INDEPENDENT_AMBULATORY_CARE_PROVIDER_SITE_OTHER): Payer: 59 | Admitting: Psychology

## 2018-11-05 DIAGNOSIS — F332 Major depressive disorder, recurrent severe without psychotic features: Secondary | ICD-10-CM | POA: Diagnosis not present

## 2018-11-09 ENCOUNTER — Ambulatory Visit: Payer: Self-pay | Admitting: Family Medicine

## 2018-11-12 ENCOUNTER — Ambulatory Visit (INDEPENDENT_AMBULATORY_CARE_PROVIDER_SITE_OTHER): Payer: 59 | Admitting: Psychology

## 2018-11-12 DIAGNOSIS — F332 Major depressive disorder, recurrent severe without psychotic features: Secondary | ICD-10-CM

## 2018-11-23 ENCOUNTER — Telehealth: Payer: Self-pay

## 2018-11-23 NOTE — Telephone Encounter (Signed)
Scheduled patient

## 2018-11-23 NOTE — Telephone Encounter (Signed)
Left message to reschedule appointment

## 2018-11-23 NOTE — Telephone Encounter (Signed)
Copied from CRM [id number]. Topic: Appointment Scheduling - Scheduling Inquiry for Clinic >> Nov 22, 2018  3:14 PM Marylen Ponto wrote: Reason for CRM: Pt called to cancel appt with Dr. Katrinka Blazing on 11/29/18. Pt would like to a call back to reschedule appt. Cb# [phone]

## 2018-11-26 ENCOUNTER — Ambulatory Visit: Payer: 59 | Admitting: Psychology

## 2018-11-29 ENCOUNTER — Ambulatory Visit: Payer: Self-pay | Admitting: Family Medicine

## 2018-12-03 ENCOUNTER — Ambulatory Visit: Payer: 59 | Admitting: Psychology

## 2018-12-10 ENCOUNTER — Telehealth: Payer: Self-pay | Admitting: Internal Medicine

## 2018-12-10 ENCOUNTER — Ambulatory Visit (INDEPENDENT_AMBULATORY_CARE_PROVIDER_SITE_OTHER): Payer: 59 | Admitting: Psychology

## 2018-12-10 DIAGNOSIS — F332 Major depressive disorder, recurrent severe without psychotic features: Secondary | ICD-10-CM

## 2018-12-10 NOTE — Telephone Encounter (Signed)
Pt called to scheduled an appt for Surgical clearance for a hip surgery being done at [facility], would you like virtual or in office?

## 2018-12-10 NOTE — Telephone Encounter (Signed)
virtual

## 2018-12-10 NOTE — Telephone Encounter (Signed)
Appointment made for monday °

## 2018-12-12 NOTE — Progress Notes (Signed)
Virtual Visit via Video Note  I connected with Robyn Long on 12/13/18 at 10:00 AM EDT by a video enabled telemedicine application and verified that I am speaking with the correct person using two identifiers.   I discussed the limitations of evaluation and management by telemedicine and the availability of in person appointments. The patient expressed understanding and agreed to proceed.  The patient is currently at home and I am in the office.    No referring provider.    History of Present Illness: This visit is for pre-operative clearance for hip surgery.   Pre-operative clearance was requested by Dr Emilio Math at [facility] for right hip arthroscopy on TBD.  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  She has done PT, which did help, but she is not able to do that now, but is doing exercises at home.  Conservative treatment has been successful in relieving her pain.   She denies any personal or family history of problems with anesthesia or bleeding/blood clot problems.    She is concerned about her BP being higher than usual.   She wonders if that will affect surgery.  She does not monitor it at home - she is just going by her numbers from here.   She is exercising  - walking and PT exercises.  With her daily activities she denies chest pain, palpitations, SOB and lightheadedness.      Review of Systems  Constitutional: Negative for chills and fever.  HENT:       PND  Respiratory: Positive for cough (allergies ). Negative for shortness of breath and wheezing.   Cardiovascular: Negative for chest pain, palpitations and leg swelling.  Gastrointestinal: Negative for abdominal pain, blood in stool, constipation, diarrhea and nausea.  Genitourinary: Negative for dysuria and frequency.  Skin: Negative for rash.  Neurological: Negative for dizziness and headaches.  Psychiatric/Behavioral: Negative for depression. The patient is not nervous/anxious.      Social  History   Socioeconomic History  . Marital status: Married    Spouse name: Not on file  . Number of children: Not on file  . Years of education: 16+  . Highest education level: Not on file  Occupational History  . Occupation: Associate Professor: [employer]  Social Needs  . Financial resource strain: Not on file  . Food insecurity:    Worry: Not on file    Inability: Not on file  . Transportation needs:    Medical: Not on file    Non-medical: Not on file  Tobacco Use  . Smoking status: Never Smoker  . Smokeless tobacco: Never Used  Substance and Sexual Activity  . Alcohol use: Yes    Alcohol/week: 3.0 - 4.0 standard drinks    Types: 3 - 4 Standard drinks or equivalent per week  . Drug use: No  . Sexual activity: Yes    Partners: Male    Birth control/protection: Condom  Lifestyle  . Physical activity:    Days per week: Not on file    Minutes per session: Not on file  . Stress: Not on file  Relationships  . Social connections:    Talks on phone: Not on file    Gets together: Not on file    Attends religious service: Not on file    Active member of club or organization: Not on file    Attends meetings of clubs or organizations: Not on file    Relationship  status: Not on file  Other Topics Concern  . Not on file  Social History Narrative   Caffeine Use-yes   Regular exercise-yes     Observations/Objective: Appears well in NAD Breathing normally Normal mood and affect  Assessment and Plan:  See Problem List for Assessment and Plan of chronic medical problems.   Follow Up Instructions:    I discussed the assessment and treatment plan with the patient. The patient was provided an opportunity to ask questions and all were answered. The patient agreed with the plan and demonstrated an understanding of the instructions.   The patient was advised to call back or seek an in-person evaluation if the symptoms worsen or if the condition fails to  improve as anticipated.    Pincus SanesStacy J Burns, MD

## 2018-12-13 ENCOUNTER — Encounter: Payer: Self-pay | Admitting: Internal Medicine

## 2018-12-13 ENCOUNTER — Ambulatory Visit (INDEPENDENT_AMBULATORY_CARE_PROVIDER_SITE_OTHER): Payer: Managed Care, Other (non HMO) | Admitting: Internal Medicine

## 2018-12-13 DIAGNOSIS — Z01818 Encounter for other preprocedural examination: Secondary | ICD-10-CM | POA: Diagnosis not present

## 2018-12-13 DIAGNOSIS — R030 Elevated blood-pressure reading, without diagnosis of hypertension: Secondary | ICD-10-CM | POA: Diagnosis not present

## 2018-12-13 NOTE — Assessment & Plan Note (Signed)
Reviewed BP measures - have had 50% above 130/80 - borderline high Discussed regular exercise, low salt diet and weight loss We will just monitor for now - she does not warrant medication This will not affect her anticipated surgery

## 2018-12-13 NOTE — Assessment & Plan Note (Signed)
Low risk for low risk surgery No further evaluation/testing needed Letter sent to Dr Caswell Corwin for clearance

## 2018-12-17 ENCOUNTER — Ambulatory Visit: Payer: 59 | Admitting: Psychology

## 2018-12-24 ENCOUNTER — Ambulatory Visit: Payer: 59 | Admitting: Psychology

## 2018-12-28 ENCOUNTER — Other Ambulatory Visit: Payer: Self-pay

## 2018-12-31 ENCOUNTER — Ambulatory Visit (INDEPENDENT_AMBULATORY_CARE_PROVIDER_SITE_OTHER): Payer: 59 | Admitting: Psychology

## 2018-12-31 DIAGNOSIS — F332 Major depressive disorder, recurrent severe without psychotic features: Secondary | ICD-10-CM | POA: Diagnosis not present

## 2019-01-07 ENCOUNTER — Ambulatory Visit: Payer: 59 | Admitting: Psychology

## 2019-01-11 ENCOUNTER — Other Ambulatory Visit: Payer: Self-pay

## 2019-01-14 ENCOUNTER — Ambulatory Visit: Payer: Self-pay | Admitting: Family Medicine

## 2019-01-21 ENCOUNTER — Ambulatory Visit (INDEPENDENT_AMBULATORY_CARE_PROVIDER_SITE_OTHER): Payer: 59 | Admitting: Psychology

## 2019-01-21 DIAGNOSIS — F332 Major depressive disorder, recurrent severe without psychotic features: Secondary | ICD-10-CM | POA: Diagnosis not present

## 2019-01-26 DIAGNOSIS — M357 Hypermobility syndrome: Secondary | ICD-10-CM | POA: Insufficient documentation

## 2019-01-26 DIAGNOSIS — M1712 Unilateral primary osteoarthritis, left knee: Secondary | ICD-10-CM | POA: Insufficient documentation

## 2019-01-28 ENCOUNTER — Ambulatory Visit: Payer: 59 | Admitting: Psychology

## 2019-02-04 ENCOUNTER — Other Ambulatory Visit: Payer: Self-pay

## 2019-02-04 ENCOUNTER — Ambulatory Visit: Payer: 59 | Admitting: Psychology

## 2019-02-04 ENCOUNTER — Ambulatory Visit
Admission: RE | Admit: 2019-02-04 | Discharge: 2019-02-04 | Disposition: A | Discharge status: Home / Self Care | Payer: Managed Care, Other (non HMO) | Source: Ambulatory Visit | Attending: Family Medicine | Admitting: Family Medicine

## 2019-02-04 DIAGNOSIS — M25511 Pain in right shoulder: Secondary | ICD-10-CM

## 2019-02-04 MED ORDER — IOPAMIDOL (ISOVUE-M 200) INJECTION 41%
15.0000 mL | Freq: Once | INTRAMUSCULAR | Status: AC
  Administered 2019-02-04: 15 mL via INTRA_ARTICULAR

## 2019-02-06 NOTE — Progress Notes (Signed)
[facility] [address] [place], [place] [address] Phone: [phone] Subjective:   [name], am serving as a scribe for Dr. Hulan Saas.  I'm seeing this patient by the request  of:    CC: Back pain, neck pain, shoulder pain follow-up  [id number]  Robyn Long is a 33 y.o. female coming in with complaint of right hip pain. Surgery on 01/06/2019. Patient has been doing physical therapy. Patient states that she saw her ortho today and was told that everything looked good.  Is here for MRI results right shoulder.  Os acromiale on MRI otherwise fairly unremarkable.    Past Medical History:  Diagnosis Date  . Abnormal Pap smear of cervix 2014  . Allergy   . Arthritis   . Depression   . History of chicken pox    Past Surgical History:  Procedure Laterality Date  . CRYOTHERAPY  2014   for abnormal pap smear   Social History   Socioeconomic History  . Marital status: Married    Spouse name: Not on file  . Number of children: Not on file  . Years of education: 16+  . Highest education level: Not on file  Occupational History  . 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 Financial resource strain: Not on file  . Food insecurity    Worry: Not on file    Inability: Not on file  . Transportation needs    Medical: Not on file    Non-medical: Not on file  Tobacco Use  . Smoking status: Never Smoker  . Smokeless tobacco: Never Used  Substance and Sexual Activity  . Alcohol use: Yes    Alcohol/week: 3.0 - 4.0 standard drinks    Types: 3 - 4 Standard drinks or equivalent per week  . Drug use: No  . Sexual activity: Yes    Partners: Male    Birth control/protection: Condom  Lifestyle  . Physical activity    Days per week: Not on file    Minutes per session: Not on file  . Stress: Not on file  Relationships  . Social Herbalist on phone: Not on file    Gets together: Not on  file    Attends religious service: Not on file    Active member of club or organization: Not on file    Attends meetings of clubs or organizations: Not on file    Relationship status: Not on file  Other Topics Concern  . Not on file  Social History Narrative   Caffeine Use-yes   Regular exercise-yes   No Known Allergies Family History  Problem Relation Age of Onset  . Mitral valve prolapse Father   . Hypertension Mother   . Colon polyps Mother   . Breast cancer Maternal Aunt 40       deceased from breast cancer  . Lung cancer Paternal Grandfather   . Mitral valve prolapse Paternal Aunt   . Lung cancer Maternal Grandfather   . Mitral valve prolapse Paternal Aunt   . Colon cancer Neg Hx          Current Outpatient Medications (Other):  .  gabapentin (NEURONTIN) 300 MG capsule, 1 PO q HS, may increase to 1 PO BID if needed .  Glucosamine Sulfate (SYNOVACIN PO), Glucosamine  1 q day .  glucosamine-chondroitin 500-400 MG tablet, Take 1 tablet by mouth daily. .  Multiple Vitamins-Minerals (WOMENS  MULTIVITAMIN PLUS) TABS, Take 1 tablet by mouth daily.    Past medical history, social, surgical and family history all reviewed in electronic medical record.  No pertanent information unless stated regarding to the chief complaint.   Review of Systems:  No headache, visual changes, nausea, vomiting, diarrhea, constipation, dizziness, abdominal pain, skin rash, fevers, chills, night sweats, weight loss, swollen lymph nodes, body aches, joint swelling, muscle aches, chest pain, shortness of breath, mood changes.   Objective  Blood pressure 120/90, pulse 98, height 5\' 10"  (1.778 m), weight 201 lb (91.2 kg), last menstrual period 01/11/2019, SpO2 99 %.   General: No apparent distress alert and oriented x3 mood and affect normal, dressed appropriately.  HEENT: Pupils equal, extraocular movements intact  Respiratory: Patient's speak in full sentences and does not appear short of breath   Cardiovascular: No lower extremity edema, non tender, no erythema  Skin: Warm dry intact with no signs of infection or rash on extremities or on axial skeleton.  Abdomen: Soft nontender  Neuro: Cranial nerves II through XII are intact, neurovascularly intact in all extremities with 2+ DTRs and 2+ pulses.  Lymph: No lymphadenopathy of posterior or anterior cervical chain or axillae bilaterally.  Gait normal with good balance and coordination.  MSK:  Non tender with full range of motion and good stability and symmetric strength and tone of , elbows, wrist, , knee and ankles bilaterally.  Right shoulder shows some mild impingement otherwise unremarkable Did not check patient's left hip at this visit.   Impression and Recommendations:      The above documentation has been reviewed and is accurate and complete Judi SaaZachary M Bronson Bressman, DO       Note: This dictation was prepared with Dragon dictation along with smaller phrase technology. Any transcriptional errors that result from this process are unintentional.

## 2019-02-07 ENCOUNTER — Other Ambulatory Visit: Payer: Self-pay

## 2019-02-07 ENCOUNTER — Ambulatory Visit: Payer: Managed Care, Other (non HMO) | Admitting: Family Medicine

## 2019-02-07 ENCOUNTER — Encounter: Payer: Self-pay | Admitting: Family Medicine

## 2019-02-07 VITALS — BP 120/90 | HR 98 | Ht 70.0 in | Wt 201.0 lb

## 2019-02-07 DIAGNOSIS — M25811 Other specified joint disorders, right shoulder: Secondary | ICD-10-CM | POA: Diagnosis not present

## 2019-02-07 DIAGNOSIS — G2589 Other specified extrapyramidal and movement disorders: Secondary | ICD-10-CM | POA: Diagnosis not present

## 2019-02-07 NOTE — Patient Instructions (Addendum)
Good to see you Hold on manipulation See me again in 4-6 weeks

## 2019-02-07 NOTE — Assessment & Plan Note (Signed)
Patient continues to have some difficulty.  We discussed the scapular dyskinesis. On any type of osteopathic manipulation at this moment.  Patient will start with this and I see how she is responding.  Patient does have significant breast tissue is likely contributing to some of the discomfort and pain as well.  Patient is only 3 weeks out from the surgery.  This was of her hip.  Manipulation secondary to diet follow-up again in 3 to 4 weeks

## 2019-02-11 ENCOUNTER — Ambulatory Visit (INDEPENDENT_AMBULATORY_CARE_PROVIDER_SITE_OTHER): Payer: 59 | Admitting: Psychology

## 2019-02-11 DIAGNOSIS — F332 Major depressive disorder, recurrent severe without psychotic features: Secondary | ICD-10-CM

## 2019-02-18 ENCOUNTER — Ambulatory Visit: Payer: 59 | Admitting: Psychology

## 2019-02-25 ENCOUNTER — Ambulatory Visit: Payer: 59 | Admitting: Psychology

## 2019-03-02 ENCOUNTER — Ambulatory Visit: Payer: Managed Care, Other (non HMO) | Attending: Family Medicine

## 2019-03-02 ENCOUNTER — Other Ambulatory Visit: Payer: Self-pay

## 2019-03-02 DIAGNOSIS — M546 Pain in thoracic spine: Secondary | ICD-10-CM | POA: Insufficient documentation

## 2019-03-02 DIAGNOSIS — G8929 Other chronic pain: Secondary | ICD-10-CM | POA: Insufficient documentation

## 2019-03-02 DIAGNOSIS — M6281 Muscle weakness (generalized): Secondary | ICD-10-CM | POA: Insufficient documentation

## 2019-03-02 DIAGNOSIS — R293 Abnormal posture: Secondary | ICD-10-CM | POA: Diagnosis present

## 2019-03-02 DIAGNOSIS — M25511 Pain in right shoulder: Secondary | ICD-10-CM | POA: Diagnosis present

## 2019-03-02 NOTE — Therapy (Signed)
[facility] [address], [address] [place], [place], [address] Phone: [phone]   Fax:  [phone]  Physical Therapy Evaluation  Patient Details  Name: Robyn Long MRN: [id number] Date of Birth: 02/12/1986 Referring Provider (PT): Antoine PrimasSmith, Zachary, MD   Encounter Date: 03/02/2019  PT End of Session - 03/02/19 0924    Visit Number  1    Date for PT Re-Evaluation  04/27/19    Authorization Type  Cign[employer]    Authorization - Number of Visits  60    PT Start Time  0831    PT Stop Time  0915    PT Time Calculation (min)  44 min    Activity Tolerance  Patient tolerated treatment well    Behavior During Therapy  [facility] for tasks assessed/performed       Past Medical History:  Diagnosis Date  . Abnormal Pap smear of cervix 2014  . Allergy   . Arthritis   . Depression   . History of chicken pox     Past Surgical History:  Procedure Laterality Date  . CRYOTHERAPY  2014   for abnormal pap smear    There were no vitals filed for this visit.   Subjective Assessment - 03/02/19 0834    Subjective  Pt presents to PT with Rt scapular pain that began 03/2017 due to a fall while carrying a tray at work.  Pt had PT  treatment in the past (07/2018) and had trigger point injections.    Pertinent History  fall at work 2018.  Rt hip arthroscopy (01/06/2019)-[facility]    Diagnostic tests  MRI of Rt shoulder: negaitve    Patient Stated Goals  reduce scapular pain    Currently in Pain?  Yes    Pain Score  0-No pain   4/10 max pain   Pain Location  Scapula    Pain Orientation  Right    Pain Descriptors / Indicators  Tingling;Sore    Pain Type  Chronic pain    Pain Onset  More than a month ago    Pain Frequency  Intermittent    Aggravating Factors   weightbearing into the arms (yoga/dance), after working out, holding objects with Rt UE away from the body    Pain Relieving Factors  ice, pec stretch/chest opening          [facility] - 03/02/19 0001      Assessment   Medical Diagnosis  scapular dyskinesis    Referring Provider (PT)  Antoine PrimasSmith, Zachary, MD    Onset Date/Surgical Date  04/01/17    Next MD Visit  03/16/2019    Prior Therapy  in December 2019      Precautions   Precautions  Other (comment)   hip precautions after arthroscopy     Restrictions   Weight Bearing Restrictions  No      Balance Screen   Has the patient fallen in the past 6 months  No    Has the patient had a decrease in activity level because of a fear of falling?   No    Is the patient reluctant to leave their home because of a fear of falling?   No      Home Public house managernvironment   Living Environment  Private residence    Living Arrangements  Spouse/significant other      Prior Function   Level of Independence  Independent    Vocation Requirements  pt  teaches dance at [employer]    Leisure  yoga      Cognition   Overall Cognitive Status  Within Functional Limits for tasks assessed      Observation/Other Assessments   Focus on Therapeutic Outcomes (FOTO)   25% limitation      Posture/Postural Control   Posture/Postural Control  Postural limitations    Postural Limitations  Rounded Shoulders      ROM / Strength   AROM / PROM / Strength  AROM;PROM;Strength      AROM   Overall AROM   Within functional limits for tasks performed    Overall AROM Comments  Full cervical, Rt shoulder and thoracic A/ROM.  Reduced thoracic segmental mobility into extension and sidebending.  Rt upper trap pain with Lt sidebending.  Rt anterior shoulder pain with overpressure in all directions      PROM   Overall PROM   Within functional limits for tasks performed      Strength   Overall Strength  Deficits    Strength Assessment Site  Shoulder    Right/Left Shoulder  Right;Left    Right Shoulder Flexion  4+/5    Right Shoulder Extension  4/5    Right Shoulder ABduction  4+/5    Right Shoulder Internal Rotation   5/5    Right Shoulder External Rotation  4/5    Left Shoulder Extension  5/5    Left Shoulder ABduction  4+/5    Left Shoulder Internal Rotation  5/5    Left Shoulder External Rotation  5/5      Palpation   Spinal mobility  reduced segmental mobility T8-L1 with pain, reduced PA mobility T3-6 with pain    Palpation comment  palpable tenderness over Rt lat, lower trap, anterior deltoid and upper  traps      Transfers   Transfers  Independent with all Transfers      Ambulation/Gait   Ambulation/Gait  Yes    Gait Pattern  Within Functional Limits                Objective measurements completed on examination: See above findings.              PT Education - 03/02/19 0920    Education Details  Access Code: [email or web address]    Person(s) Educated  Patient    Methods  Explanation;Demonstration;Handout    Comprehension  Verbalized understanding;Returned demonstration       PT Short Term Goals - 03/02/19 0912      PT SHORT TERM GOAL #1   Title  be indpendent in initial HEP    Time  4    Period  Weeks    Target Date  03/30/19      PT SHORT TERM GOAL #2   Title  report a 30% reduction in Rt shoulder pain and scapular tingling with use of the Rt UE    Time  4    Period  Weeks    Status  New    Target Date  03/30/19      PT SHORT TERM GOAL #3   Title  demonstrate use of the Rt UE without scapular elevation    Time  4    Period  Weeks    Status  New    Target Date  03/30/19        PT Long Term Goals - 03/02/19 0916      PT LONG TERM GOAL #1   Title  be  independent in advanced HEP    Time  8    Period  Weeks    Status  New    Target Date  04/27/19      PT LONG TERM GOAL #2   Title  reduce FOTO to < or = to 22% limitation    Time  8    Period  Weeks    Status  New    Target Date  04/27/19      PT LONG TERM GOAL #3   Title  report > or = to 70% reduction in Rt UE pain with use of Rt UE    Time  8    Period  Weeks    Status  New    Target Date   04/27/19      PT LONG TERM GOAL #4   Title  demonstrate 5/5 Rt shoulder external rotation and extension to improve postural endurance to reduce anterior Rt shoulder pain    Time  8    Period  Weeks    Status  New    Target Date  04/27/19             Plan - 03/02/19 0929    Clinical Impression Statement  Pt presents with a 2 year history of Rt shoulder/scapular pain and tingling that began 03/2017.  Pt had a fall while holding a food tray and injured her shoulder and hip.  Pt rates Rt shoulder pain as 4/10 with use and has tingling in the scapular region. Recent MRI of the Rt shoulder was negative.  Pt demonstrates reduced thoracic segmental mobility, reduced scapular mobility on the Rt vs left with shoulder abduction and flexion and painful Rt shoulder at end range of all A/ROM with overpressure.  Pt with trigger points in Rt lats, upper traps and anterior deltoid.  Pt demonstrates scapular elevation on the Rt with use of Rt UE against gravity.  Pt with 4/5 Rt shoulder extension and ER. Pt will benefit from skilled PT to improve thoracic mobility, scapular and shoulder strength and address trigger points.    Personal Factors and Comorbidities  Comorbidity 1    Comorbidities  s/p Rt hip arthroscopy 12/2018    Clinical Decision Making  Low    Rehab Potential  Excellent    PT Frequency  2x / week    PT Duration  8 weeks    PT Treatment/Interventions  ADLs/Self Care Home Management;Electrical Stimulation;Cryotherapy;Moist Heat;Ultrasound;Neuromuscular re-education;Therapeutic activities;Therapeutic exercise;Patient/family education;Manual techniques;Joint Manipulations;Spinal Manipulations;Passive range of motion;Dry needling    PT Next Visit Plan  dry needling to thoracic multifidi, Rt lats, deltoid.  Postural strength and thoracic mobility    PT Home Exercise Plan  Access Code: [email or web address]       Patient will benefit from skilled therapeutic intervention in order to improve the following  deficits and impairments:  Decreased activity tolerance, Decreased strength, Increased muscle spasms, Postural dysfunction, Pain  Visit Diagnosis: 1. Pain in thoracic spine   2. Muscle weakness (generalized)   3. Chronic right shoulder pain   4. Abnormal posture        Problem List Patient Active Problem List   Diagnosis Date Noted  . 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 Elevated blood pressure reading 12/13/2018  . Preop examination 12/13/2018  . Trigger point of right shoulder region 10/06/2018  . Right shoulder pain 06/15/2018  . Pain of left calf 12/30/2017  . Labral tear of hip joint 12/09/2017  . Nonallopathic lesion of thoracic  region 07/01/2017  . Nonallopathic lesion of sacral region 07/01/2017  . Nonallopathic lesion of lumbosacral region 07/01/2017  . Acute bursitis of right shoulder 06/18/2017  . Scapular dyskinesis 06/18/2017  . Right hip pain 03/23/2017  . Constipation 04/08/2016  . Cervical intraepithelial neoplasia grade 2 02/10/2013  . Knee pain, right anterior 01/06/2013     Lorrene ReidKelly Takacs, PT 03/02/19 10:30 AM  [facility] [address], [address] [place], [place], [address] Phone: [phone]   Fax:  [phone]  Name: Robyn Long MRN: [id number] Date of Birth: 01/02/1986

## 2019-03-02 NOTE — Patient Instructions (Addendum)
Cervico-Thoracic: Extension / Rotation (Sitting)    Reach across body with left arm and grasp back of chair. Gently look over right side shoulder. Hold __20__ seconds. Relax. Repeat _3___ times per set. Do ____ sets per session. Do ___3_ sessions per day.  http://orth.exer.us/981   Copyright  VHI. All rights reserved.  Access Code: [email or web address]  URL: [email or web address]  Date: 03/02/2019  Prepared by: Sigurd Sos   Exercises  Cat-Camel - 10 reps - 1 sets - 5 hold - 3x daily - 7x weekly  Standing Shoulder External Rotation with Resistance - 10 reps - 2x daily - 7x weekly  Standing Shoulder External Rotation with Resistance - 10 reps - 1 sets - 2x daily - 7x weekly  Standing Shoulder Horizontal Abduction with Resistance - 10 reps - 3 sets - 1x daily - 7x weekly  Sidelying Open Book Thoracic Rotation with Knee on Foam Roll - 10 reps - 3 hold - 3x daily - 7x weekly   [facility] [address], [place], [place] [address] Phone # [phone] Fax [phone]

## 2019-03-04 ENCOUNTER — Ambulatory Visit (INDEPENDENT_AMBULATORY_CARE_PROVIDER_SITE_OTHER): Payer: 59 | Admitting: Psychology

## 2019-03-04 DIAGNOSIS — F332 Major depressive disorder, recurrent severe without psychotic features: Secondary | ICD-10-CM

## 2019-03-09 ENCOUNTER — Ambulatory Visit: Payer: Managed Care, Other (non HMO) | Admitting: Family Medicine

## 2019-03-11 ENCOUNTER — Ambulatory Visit (INDEPENDENT_AMBULATORY_CARE_PROVIDER_SITE_OTHER): Payer: 59 | Admitting: Psychology

## 2019-03-11 DIAGNOSIS — F332 Major depressive disorder, recurrent severe without psychotic features: Secondary | ICD-10-CM | POA: Diagnosis not present

## 2019-03-14 ENCOUNTER — Other Ambulatory Visit: Payer: Self-pay

## 2019-03-14 ENCOUNTER — Ambulatory Visit: Payer: Managed Care, Other (non HMO) | Attending: Family Medicine

## 2019-03-14 DIAGNOSIS — G8929 Other chronic pain: Secondary | ICD-10-CM | POA: Insufficient documentation

## 2019-03-14 DIAGNOSIS — R293 Abnormal posture: Secondary | ICD-10-CM | POA: Insufficient documentation

## 2019-03-14 DIAGNOSIS — M25511 Pain in right shoulder: Secondary | ICD-10-CM | POA: Insufficient documentation

## 2019-03-14 DIAGNOSIS — M546 Pain in thoracic spine: Secondary | ICD-10-CM | POA: Diagnosis not present

## 2019-03-14 DIAGNOSIS — M6281 Muscle weakness (generalized): Secondary | ICD-10-CM | POA: Insufficient documentation

## 2019-03-14 NOTE — Patient Instructions (Signed)

## 2019-03-14 NOTE — Therapy (Signed)
[facility] [address] [place], [place], [address] Phone: [phone]   Fax:  [phone]  Physical Therapy Treatment  Patient Details  Name: Robyn Long MRN: [id number] Date of Birth: [birth year] Referring Provider (PT): Hulan Saas, MD   Encounter Date: 03/14/2019  PT End of Session - 03/14/19 1015    Visit Number  2    Date for PT Re-Evaluation  04/27/19    Authorization Type  Cigna    PT Start Time  [id number]    PT Stop Time  1014    PT Time Calculation (min)  43 min    Activity Tolerance  Patient tolerated treatment well    Behavior During Therapy  [facility] for tasks assessed/performed       Past Medical History:  Diagnosis Date  . Abnormal Pap smear of cervix 2014  . Allergy   . Arthritis   . Depression   . History of chicken pox     Past Surgical History:  Procedure Laterality Date  . CRYOTHERAPY  2014   for abnormal pap smear    There were no vitals filed for this visit.  Subjective Assessment - 03/14/19 0931    Subjective  I am doing OK with the exercises.    Patient Stated Goals  reduce scapular pain    Currently in Pain?  Yes    Pain Score  0-No pain   up to 2-3/10   Pain Location  Scapula    Pain Orientation  Right    Pain Descriptors / Indicators  Tingling    Pain Type  Chronic pain    Pain Onset  More than a month ago    Pain Frequency  Intermittent    Aggravating Factors   weightbearing into the arms (yoga/dance)    Pain Relieving Factors  ice, pec stretch/chest opening                       [facility] Adult PT Treatment/Exercise - 03/14/19 0001      Exercises   Exercises  Lumbar;Shoulder      Lumbar Exercises: Sidelying   Other Sidelying Lumbar Exercises  open book 2x10      Shoulder Exercises: Standing   Horizontal ABduction  Strengthening;Both;20 reps;Theraband    Theraband Level (Shoulder Horizontal ABduction)  Level 3 (Green)    External Rotation   Strengthening;Both;20 reps;Theraband    Theraband Level (Shoulder External Rotation)  Level 3 (Green)      Shoulder Exercises: ROM/Strengthening   UBE (Upper Arm Bike)  Level 1 x 6 minutes (3/3)   PT present to discuss progress     Manual Therapy   Manual Therapy  Myofascial release;Soft tissue mobilization    Manual therapy comments  Rt posterior deltoid, thoracic parapsinals, subscapularis and rhomboids- paplation and assessment during dry needling       Trigger Point Dry Needling - 03/14/19 0001    Consent Given?  Yes    Education Handout Provided  Yes    Muscles Treated Upper Quadrant  Subscapularis;Rhomboids;Deltoid   Rt only   Dry Needling Comments  thoracic multifidi    Rhomboids Response  Twitch response elicited;Palpable increased muscle length    Subscapularis Response  Twitch response elicited;Palpable increased muscle length    Deltoid Response  Twitch response elicited;Palpable increased muscle length           PT Education - 03/14/19 0958    Education Details  DN info  Person(s) Educated  Patient    Methods  Explanation;Demonstration;Handout    Comprehension  Verbalized understanding;Returned demonstration       PT Short Term Goals - 03/02/19 0912      PT SHORT TERM GOAL #1   Title  be indpendent in initial HEP    Time  4    Period  Weeks    Target Date  03/30/19      PT SHORT TERM GOAL #2   Title  report a 30% reduction in Rt shoulder pain and scapular tingling with use of the Rt UE    Time  4    Period  Weeks    Status  New    Target Date  03/30/19      PT SHORT TERM GOAL #3   Title  demonstrate use of the Rt UE without scapular elevation    Time  4    Period  Weeks    Status  New    Target Date  03/30/19        PT Long Term Goals - 03/02/19 0916      PT LONG TERM GOAL #1   Title  be independent in advanced HEP    Time  8    Period  Weeks    Status  New    Target Date  04/27/19      PT LONG TERM GOAL #2   Title  reduce FOTO to  < or = to 22% limitation    Time  8    Period  Weeks    Status  New    Target Date  04/27/19      PT LONG TERM GOAL #3   Title  report > or = to 70% reduction in Rt UE pain with use of Rt UE    Time  8    Period  Weeks    Status  New    Target Date  04/27/19      PT LONG TERM GOAL #4   Title  demonstrate 5/5 Rt shoulder external rotation and extension to improve postural endurance to reduce anterior Rt shoulder pain    Time  8    Period  Weeks    Status  New    Target Date  04/27/19            Plan - 03/14/19 0942    Clinical Impression Statement  Pt with first time follow-up after evaluation today.  Pt has been able to do weightbearing activity into her Rt UE and reports that she doesn't feel like the correct muscles are firing when she does this.  PT reviewed all HEP for stabilization and flexibility and pt required tactile cuing to reduce scapular elevation and to fire the posterior scapular musculature. Pt demonstrates reduced thoracic segmental mobility with quadruped activity today.  Pt with trigger points and tension in bil thoracic multifidi and Rt scapular musculature and demonstrated improved tissue mobility after dry needling and manual therapy today.  Pt will continue to benefit from skilled PT for postural, scapular and shoulder stability and strength, flexibility and manual to address tissue mobility.    Rehab Potential  Excellent    PT Frequency  2x / week    PT Duration  12 weeks    PT Treatment/Interventions  ADLs/Self Care Home Management;Electrical Stimulation;Cryotherapy;Moist Heat;Ultrasound;Neuromuscular re-education;Therapeutic activities;Therapeutic exercise;Patient/family education;Manual techniques;Joint Manipulations;Spinal Manipulations;Passive range of motion;Dry needling    PT Next Visit Plan  assess response to dry needling to thoracic multifidi,  Rt lats, deltoid.  Postural strength and thoracic mobility    PT Home Exercise Plan  Access Code: [email or web address]     Recommended Other Services  initial certification is signed    Consulted and Agree with Plan of Care  Patient       Patient will benefit from skilled therapeutic intervention in order to improve the following deficits and impairments:  Decreased activity tolerance, Decreased strength, Increased muscle spasms, Postural dysfunction, Pain  Visit Diagnosis: 1. Pain in thoracic spine   2. Muscle weakness (generalized)   3. Chronic right shoulder pain   4. Abnormal posture        Problem List Patient Active Problem List   Diagnosis Date Noted  . 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 Elevated blood pressure reading 12/13/2018  . Preop examination 12/13/2018  . Trigger point of right shoulder region 10/06/2018  . Right shoulder pain 06/15/2018  . Pain of left calf 12/30/2017  . Labral tear of hip joint 12/09/2017  . Nonallopathic lesion of thoracic region 07/01/2017  . Nonallopathic lesion of sacral region 07/01/2017  . Nonallopathic lesion of lumbosacral region 07/01/2017  . Acute bursitis of right shoulder 06/18/2017  . Scapular dyskinesis 06/18/2017  . Right hip pain 03/23/2017  . Constipation 04/08/2016  . Cervical intraepithelial neoplasia grade 2 02/10/2013  . Knee pain, right anterior 01/06/2013     Lorrene ReidKelly Reign Bartnick, PT 03/14/19 10:26 AM  [facility] [address], [address] [place], [place], [address] Phone: [phone]   Fax:  [phone]  Name: [name] MRN: [id number] Date of Birth: 01/27/1986

## 2019-03-16 ENCOUNTER — Ambulatory Visit: Payer: Managed Care, Other (non HMO) | Admitting: Physical Therapy

## 2019-03-16 ENCOUNTER — Encounter: Payer: Self-pay | Admitting: Physical Therapy

## 2019-03-16 ENCOUNTER — Other Ambulatory Visit: Payer: Self-pay

## 2019-03-16 DIAGNOSIS — R293 Abnormal posture: Secondary | ICD-10-CM

## 2019-03-16 DIAGNOSIS — M6281 Muscle weakness (generalized): Secondary | ICD-10-CM

## 2019-03-16 DIAGNOSIS — M546 Pain in thoracic spine: Secondary | ICD-10-CM

## 2019-03-16 DIAGNOSIS — G8929 Other chronic pain: Secondary | ICD-10-CM

## 2019-03-16 NOTE — Therapy (Signed)
[facility] [address], [place], [place], [address] Phone: [phone]   Fax:  [phone]  Physical Therapy Treatment  Patient Details  Name: Robyn Long MRN: [id number] Date of Birth: Jun 27, 1986 Referring Provider (PT): Hulan Saas, MD   Encounter Date: 03/16/2019  PT End of Session - 03/16/19 0935    Visit Number  3    Date for PT Re-Evaluation  04/27/19    Authorization Type  [employer]    Authorization - Number of Visits  [id number]    PT Start Time  0935    PT Stop Time  1015    PT Time Calculation (min)  40 min    Activity Tolerance  Patient tolerated treatment well    Behavior During Therapy  [facility] for tasks assessed/performed       Past Medical History:  Diagnosis Date  . Abnormal Pap smear of cervix 2014  . Allergy   . Arthritis   . Depression   . History of chicken pox     Past Surgical History:  Procedure Laterality Date  . CRYOTHERAPY  2014   for abnormal pap smear    There were no vitals filed for this visit.  Subjective Assessment - 03/16/19 0936    Subjective  Discomfort this AM, not pain. Needling was ok.    Pertinent History  fall at work 2018.  Rt hip arthroscopy (01/06/2019)-still current in PT at [facility]    Diagnostic tests  MRI of Rt shoulder: negaitve    Patient Stated Goals  reduce scapular pain    Currently in Pain?  Yes    Pain Score  3     Pain Location  Scapula    Pain Orientation  Right    Pain Descriptors / Indicators  Sore    Multiple Pain Sites  No                       [facility] Adult PT Treatment/Exercise - 03/16/19 0001      Lumbar Exercises: Stretches   Other Lumbar Stretch Exercise  open book stretch bil 10x      Shoulder Exercises: Supine   Protraction  --   protraction/retraction 2x5,      Shoulder Exercises: ROM/Strengthening   UBE (Upper Arm Bike)  Level 1 x 6 minutes (3/3)   PTA present to discuss progress     Shoulder Exercises: Stretch    Other Shoulder Stretches  Thoracic extension 5x over the foam roll, scissors on foam roll 10x, green  band scapualr unattached series 10x each                PT Short Term Goals - 03/16/19 0949      PT SHORT TERM GOAL #1   Title  be indpendent in initial HEP    Time  4    Period  Weeks    Status  Achieved        PT Long Term Goals - 03/02/19 1941      PT LONG TERM GOAL #1   Title  be independent in advanced HEP    Time  8    Period  Weeks    Status  New    Target Date  04/27/19      PT LONG TERM GOAL #2   Title  reduce FOTO to < or = to 22% limitation    Time  8    Period  Weeks  Status  New    Target Date  04/27/19      PT LONG TERM GOAL #3   Title  report > or = to 70% reduction in Rt UE pain with use of Rt UE    Time  8    Period  Weeks    Status  New    Target Date  04/27/19      PT LONG TERM GOAL #4   Title  demonstrate 5/5 Rt shoulder external rotation and extension to improve postural endurance to reduce anterior Rt shoulder pain    Time  8    Period  Weeks    Status  New    Target Date  04/27/19            Plan - 03/16/19 0940    Clinical Impression Statement  Had pt do her scapular/postural strengthening on the foam today (soft) to increase her feedback and simultaniously provide a gentle mobilization to her spine. Pt had difficulty with supine protraction/retraction in supine against gravity. This was added to her HEP.    Personal Factors and Comorbidities  Comorbidity 1    Comorbidities  s/p Rt hip arthroscopy 12/2018    Rehab Potential  Excellent    PT Frequency  2x / week    PT Duration  12 weeks    PT Treatment/Interventions  ADLs/Self Care Home Management;Electrical Stimulation;Cryotherapy;Moist Heat;Ultrasound;Neuromuscular re-education;Therapeutic activities;Therapeutic exercise;Patient/family education;Manual techniques;Joint Manipulations;Spinal Manipulations;Passive range of motion;Dry needling    PT Next Visit Plan  pt out  of town next week. DN upon return and continue to work on her scapular stabilizationand shoulder strength. Pt liked working on the foam roll.    PT Home Exercise Plan  Access Code: [email or web address]    Consulted and Agree with Plan of Care  Patient       Patient will benefit from skilled therapeutic intervention in order to improve the following deficits and impairments:  Decreased activity tolerance, Decreased strength, Increased muscle spasms, Postural dysfunction, Pain  Visit Diagnosis: 1. Pain in thoracic spine   2. Muscle weakness (generalized)   3. Chronic right shoulder pain   4. Abnormal posture        Problem List Patient Active Problem List   Diagnosis Date Noted  . 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 Elevated blood pressure reading 12/13/2018  . Preop examination 12/13/2018  . Trigger point of right shoulder region 10/06/2018  . Right shoulder pain 06/15/2018  . Pain of left calf 12/30/2017  . Labral tear of hip joint 12/09/2017  . Nonallopathic lesion of thoracic region 07/01/2017  . Nonallopathic lesion of sacral region 07/01/2017  . Nonallopathic lesion of lumbosacral region 07/01/2017  . Acute bursitis of right shoulder 06/18/2017  . Scapular dyskinesis 06/18/2017  . Right hip pain 03/23/2017  . Constipation 04/08/2016  . Cervical intraepithelial neoplasia grade 2 02/10/2013  . Knee pain, right anterior 01/06/2013    ,, PTA 03/16/2019, 10:17 AM  [facility] [address], [address] [place], [place], [address] Phone: [phone]   Fax:  [phone]  Name: Robyn Long MRN: [id number] Date of Birth: 01/18/1986

## 2019-03-17 ENCOUNTER — Ambulatory Visit (INDEPENDENT_AMBULATORY_CARE_PROVIDER_SITE_OTHER): Payer: Managed Care, Other (non HMO) | Admitting: Family Medicine

## 2019-03-17 ENCOUNTER — Encounter: Payer: Self-pay | Admitting: Family Medicine

## 2019-03-17 VITALS — BP 108/70 | HR 97 | Ht 70.0 in | Wt 201.0 lb

## 2019-03-17 DIAGNOSIS — G2589 Other specified extrapyramidal and movement disorders: Secondary | ICD-10-CM

## 2019-03-17 DIAGNOSIS — M999 Biomechanical lesion, unspecified: Secondary | ICD-10-CM | POA: Diagnosis not present

## 2019-03-17 NOTE — Progress Notes (Signed)
[facility] [place], [place] [address] Phone: [phone] Subjective:   I Robyn Long am serving as a Education administrator for Dr. Hulan Saas.  I'm seeing this patient by the request  of:    CC: back pain   XFG:[id number]   02/07/2019 Patient continues to have some difficulty.  We discussed the scapular dyskinesis. On any type of osteopathic manipulation at this moment.  Patient will start with this and I see how she is responding.  Patient does have significant breast tissue is likely contributing to some of the discomfort and pain as well.  Patient is only 3 weeks out from the surgery.  This was of her hip.  Manipulation secondary to diet follow-up again in 3 to 4 weeks  03/17/2019 Robyn Long is a 33 y.o. female coming in with complaint of scapular dyskinesis. States she is doing better.   Has been doing home exercises.  Feels like her shoulder and hip seems to be doing better slowly.  Patient continues to take gabapentin.  Making progress.  Is about to travel prolonged car ride     Past Medical History:  Diagnosis Date   Abnormal Pap smear of cervix 2014   Allergy    Arthritis    Depression    History of chicken pox    Past Surgical History:  Procedure Laterality Date   CRYOTHERAPY  2014   for abnormal pap smear   Social History   Socioeconomic History   Marital status: Married    Spouse name: Not on file   Number of children: Not on file   Years of education: 16+   Highest education level: Not on file  Occupational History   Occupation: Administrator, Civil Service: [employer]  Social Needs   Financial resource strain: Not on file   Food insecurity    Worry: Not on file    Inability: Not on file   Transportation needs    Medical: Not on file    Non-medical: Not on file  Tobacco Use   Smoking status: Never Smoker   Smokeless tobacco: Never Used  Substance and Sexual Activity   Alcohol use:  Yes    Alcohol/week: 3.0 - 4.0 standard drinks    Types: 3 - 4 Standard drinks or equivalent per week   Drug use: No   Sexual activity: Yes    Partners: Male    Birth control/protection: Condom  Lifestyle   Physical activity    Days per week: Not on file    Minutes per session: Not on file   Stress: Not on file  Relationships   Social connections    Talks on phone: Not on file    Gets together: Not on file    Attends religious service: Not on file    Active member of club or organization: Not on file    Attends meetings of clubs or organizations: Not on file    Relationship status: Not on file  Other Topics Concern   Not on file  Social History Narrative   Caffeine Use-yes   Regular exercise-yes   No Known Allergies Family History  Problem Relation Age of Onset   Mitral valve prolapse Father    Hypertension Mother    Colon polyps Mother    Breast cancer Maternal Aunt 55       deceased from breast cancer   Lung cancer Paternal Grandfather    Mitral valve prolapse Paternal Aunt  Lung cancer Maternal Grandfather    Mitral valve prolapse Paternal Aunt    Colon cancer Neg Hx          Current Outpatient Medications (Other):    gabapentin (NEURONTIN) 300 MG capsule, 1 PO q HS, may increase to 1 PO BID if needed   Glucosamine Sulfate (SYNOVACIN PO), Glucosamine  1 q day   glucosamine-chondroitin 500-400 MG tablet, Take 1 tablet by mouth daily.   Multiple Vitamins-Minerals (WOMENS MULTIVITAMIN PLUS) TABS, Take 1 tablet by mouth daily.    Past medical history, social, surgical and family history all reviewed in electronic medical record.  No pertanent information unless stated regarding to the chief complaint.   Review of Systems:  No headache, visual changes, nausea, vomiting, diarrhea, constipation, dizziness, abdominal pain, skin rash, fevers, chills, night sweats, weight loss, swollen lymph nodes, body aches, joint swelling,  chest pain,  shortness of breath, mood changes.  Positive muscle  Objective  Blood pressure 108/70, pulse 97, height 5\' 10"  (1.778 m), weight 201 lb (91.2 kg), SpO2 97 %.    General: No apparent distress alert and oriented x3 mood and affect normal, dressed appropriately.  HEENT: Pupils equal, extraocular movements intact  Respiratory: Patient's speak in full sentences and does not appear short of breath  Cardiovascular: No lower extremity edema, non tender, no erythema  Skin: Warm dry intact with no signs of infection or rash on extremities or on axial skeleton.  Abdomen: Soft nontender  Neuro: Cranial nerves II through XII are intact, neurovascularly intact in all extremities with 2+ DTRs and 2+ pulses.  Lymph: No lymphadenopathy of posterior or anterior cervical chain or axillae bilaterally.  Gait normal with good balance and coordination.  MSK:  Non tender with full range of motion and good stability and symmetric strength and tone of shoulders, elbows, wrist, hip, knee and ankles bilaterally.  Back pain seems to be doing relatively well.  Some mild tightness in the paraspinal musculature lumbar spine right greater than left.  Patient still has some mild limitation of the hip bilaterally.  Negative straight leg test.  Mild scapular dyskinesis right greater than left.  Mild flaring out.   Osteopathic findings  T5 extended rotated and side bent right inhaled rib L2 flexed rotated and side bent right Sacrum right on right    Impression and Recommendations:     This case required medical decision making of moderate complexity. The above documentation has been reviewed and is accurate and complete Robyn SaaZachary M Ricka Westra, DO       Note: This dictation was prepared with Dragon dictation along with smaller phrase technology. Any transcriptional errors that result from this process are unintentional.

## 2019-03-17 NOTE — Patient Instructions (Addendum)
[place] See me again in 4- 8 weeks

## 2019-03-17 NOTE — Assessment & Plan Note (Signed)
Continues to have some mild muscle imbalances are likely contributing to some of the aches and pains.  Does respond well to manipulation.  Encourage patient continue with the formal physical therapy as much as she feels like she is making benefit.  Follow-up with me again in 4 to 8 weeks.

## 2019-03-17 NOTE — Assessment & Plan Note (Signed)
Decision today to treat with OMT was based on Physical Exam  After verbal consent patient was treated with HVLA, ME, FPR techniques in  thoracic, lumbar and sacral areas  Patient tolerated the procedure well with improvement in symptoms  Patient given exercises, stretches and lifestyle modifications  See medications in patient instructions if given  Patient will follow up in 4-8 weeks 

## 2019-03-18 ENCOUNTER — Ambulatory Visit: Payer: 59 | Admitting: Psychology

## 2019-03-23 ENCOUNTER — Encounter: Payer: Managed Care, Other (non HMO) | Admitting: Physical Therapy

## 2019-03-25 ENCOUNTER — Ambulatory Visit: Payer: 59 | Admitting: Psychology

## 2019-03-28 ENCOUNTER — Other Ambulatory Visit: Payer: Self-pay

## 2019-03-28 ENCOUNTER — Ambulatory Visit: Payer: Managed Care, Other (non HMO)

## 2019-03-28 DIAGNOSIS — M546 Pain in thoracic spine: Secondary | ICD-10-CM | POA: Diagnosis not present

## 2019-03-28 DIAGNOSIS — R293 Abnormal posture: Secondary | ICD-10-CM

## 2019-03-28 DIAGNOSIS — M6281 Muscle weakness (generalized): Secondary | ICD-10-CM

## 2019-03-28 DIAGNOSIS — G8929 Other chronic pain: Secondary | ICD-10-CM

## 2019-03-28 NOTE — Therapy (Signed)
[facility] [address] [place], [place], [address] Phone: [phone]   Fax:  [phone]  Physical Therapy Treatment  Patient Details  Name: Robyn Long MRN: [id number] Date of Birth: [birth year] Referring Provider (PT): Hulan Saas, MD   Encounter Date: 03/28/2019  PT End of Session - 03/28/19 1016    Visit Number  4    Date for PT Re-Evaluation  04/27/19    Authorization Type  [employer]    PT Start Time  [phone]   late and dry needling   PT Stop Time  1016    PT Time Calculation (min)  38 min    Activity Tolerance  Patient tolerated treatment well    Behavior During Therapy  [facility] for tasks assessed/performed       Past Medical History:  Diagnosis Date  . Abnormal Pap smear of cervix 2014  . Allergy   . Arthritis   . Depression   . History of chicken pox     Past Surgical History:  Procedure Laterality Date  . CRYOTHERAPY  2014   for abnormal pap smear    There were no vitals filed for this visit.  Subjective Assessment - 03/28/19 0940    Subjective  I think the needling helped last time.  I have discomfort but not pain.    Pertinent History  fall at work 2018.  Rt hip arthroscopy (01/06/2019)-still current in PT at [facility]    Currently in Pain?  Yes    Pain Score  2     Pain Location  Scapula    Pain Orientation  Right    Pain Descriptors / Indicators  Sore    Pain Onset  More than a month ago    Pain Frequency  Intermittent    Aggravating Factors   weightbearing into the arms (yoga/dance)    Pain Relieving Factors  dry needling, ice, pec stretch                       [facility] Adult PT Treatment/Exercise - 03/28/19 0001      Shoulder Exercises: Supine   Protraction  20 reps;Weights   protraction/retraction 2x10   Protraction Weight (lbs)  2    Flexion  Strengthening;Both;20 reps;Theraband    Theraband Level (Shoulder Flexion)  Level 3 (Green)      Shoulder Exercises:  ROM/Strengthening   UBE (Upper Arm Bike)  Level 1 x 6 minutes (3/3)   PT present to discuss progress     Manual Therapy   Manual Therapy  Myofascial release;Soft tissue mobilization    Manual therapy comments  Rt posterior deltoid, thoracic parapsinals, subscapularis and rhomboids- paplation and assessment during dry needling       Trigger Point Dry Needling - 03/28/19 0001    Consent Given?  Yes    Muscles Treated Upper Quadrant  Subscapularis;Rhomboids;Deltoid   Rt only   Dry Needling Comments  thoracic multifidi    Rhomboids Response  Twitch response elicited;Palpable increased muscle length    Subscapularis Response  Twitch response elicited;Palpable increased muscle length    Deltoid Response  Twitch response elicited;Palpable increased muscle length             PT Short Term Goals - 03/28/19 0942      PT SHORT TERM GOAL #1   Title  be indpendent in initial HEP    Status  Achieved      PT SHORT TERM GOAL #2  Title  report a 30% reduction in Rt shoulder pain and scapular tingling with use of the Rt UE    Baseline  50% less frequent    Status  Achieved      PT SHORT TERM GOAL #3   Title  demonstrate use of the Rt UE without scapular elevation    Baseline  still elevating with functional use    Time  4    Period  Weeks    Status  On-going        PT Long Term Goals - 03/02/19 0916      PT LONG TERM GOAL #1   Title  be independent in advanced HEP    Time  8    Period  Weeks    Status  New    Target Date  04/27/19      PT LONG TERM GOAL #2   Title  reduce FOTO to < or = to 22% limitation    Time  8    Period  Weeks    Status  New    Target Date  04/27/19      PT LONG TERM GOAL #3   Title  report > or = to 70% reduction in Rt UE pain with use of Rt UE    Time  8    Period  Weeks    Status  New    Target Date  04/27/19      PT LONG TERM GOAL #4   Title  demonstrate 5/5 Rt shoulder external rotation and extension to improve postural endurance to  reduce anterior Rt shoulder pain    Time  8    Period  Weeks    Status  New    Target Date  04/27/19            Plan - 03/28/19 0951    Clinical Impression Statement  Pt reports 50% reduction in Rt UE radiculopathy with daily tasks.  Pt continues to demonstrate mild scapular elevation with overhead movement of the Rt UE.  Pt is continuing to work on UE and scapular control and strength.  Pt with tension and trigger points in Rt posterior scapular musculature and demonstrate improved tissue mobility and scapular mobility.  Pt will continue to benefit from skilled PT to improve scapular mobility and strength.    PT Frequency  2x / week    PT Duration  12 weeks    PT Treatment/Interventions  ADLs/Self Care Home Management;Electrical Stimulation;Cryotherapy;Moist Heat;Ultrasound;Neuromuscular re-education;Therapeutic activities;Therapeutic exercise;Patient/family education;Manual techniques;Joint Manipulations;Spinal Manipulations;Passive range of motion;Dry needling    PT Next Visit Plan  assess response to dry needling today, continue scapular strength and mobility    PT Home Exercise Plan  Access Code: [email or web address]    Consulted and Agree with Plan of Care  Patient       Patient will benefit from skilled therapeutic intervention in order to improve the following deficits and impairments:  Decreased activity tolerance, Decreased strength, Increased muscle spasms, Postural dysfunction, Pain  Visit Diagnosis: 1. Pain in thoracic spine   2. Muscle weakness (generalized)   3. Chronic right shoulder pain   4. Abnormal posture        Problem List Patient Active Problem List   Diagnosis Date Noted  . 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 Elevated blood pressure reading 12/13/2018  . Preop examination 12/13/2018  . Trigger point of right shoulder region 10/06/2018  . Right shoulder pain 06/15/2018  . Pain of left calf  12/30/2017  . Labral tear of hip joint 12/09/2017  .  Nonallopathic lesion of thoracic region 07/01/2017  . Nonallopathic lesion of sacral region 07/01/2017  . Nonallopathic lesion of lumbosacral region 07/01/2017  . Acute bursitis of right shoulder 06/18/2017  . Scapular dyskinesis 06/18/2017  . Right hip pain 03/23/2017  . Constipation 04/08/2016  . Cervical intraepithelial neoplasia grade 2 02/10/2013  . Knee pain, right anterior 01/06/2013   Lorrene ReidKelly Davione Lenker, PT 03/28/19 10:21 AM  [facility] [address], [address] [place], [place], [address] Phone: [phone]   Fax:  [phone]  Name: Robyn Long MRN: [id number] Date of Birth: 09/14/1985

## 2019-03-30 ENCOUNTER — Ambulatory Visit: Payer: Managed Care, Other (non HMO) | Admitting: Physical Therapy

## 2019-03-30 ENCOUNTER — Encounter: Payer: Self-pay | Admitting: Physical Therapy

## 2019-03-30 ENCOUNTER — Other Ambulatory Visit: Payer: Self-pay

## 2019-03-30 DIAGNOSIS — M6281 Muscle weakness (generalized): Secondary | ICD-10-CM

## 2019-03-30 DIAGNOSIS — M546 Pain in thoracic spine: Secondary | ICD-10-CM

## 2019-03-30 DIAGNOSIS — R293 Abnormal posture: Secondary | ICD-10-CM

## 2019-03-30 DIAGNOSIS — G8929 Other chronic pain: Secondary | ICD-10-CM

## 2019-03-30 NOTE — Therapy (Signed)
[facility] [address], [address] [place], [place], [address] Phone: [phone]   Fax:  [phone]  Physical Therapy Treatment  Patient Details  Name: Robyn Long MRN: [id number] Date of Birth: 10/11/1985 Referring Provider (PT): Antoine PrimasSmith, Zachary, MD   Encounter Date: 03/30/2019  PT End of Session - 03/30/19 1620    Visit Number  5    Date for PT Re-Evaluation  04/27/19    Authorization Type  [employer]    Authorization - Number of Visits  60    PT Start Time  1619    PT Stop Time  1657    PT Time Calculation (min)  38 min    Activity Tolerance  Patient tolerated treatment well    Behavior During Therapy  [facility] tasks assessed/performed       Past Medical History:  Diagnosis Date  . Abnormal Pap smear of cervix 2014  . Allergy   . Arthritis   . Depression   . History of chicken pox     Past Surgical History:  Procedure Laterality Date  . CRYOTHERAPY  2014   for abnormal pap smear    There were no vitals filed for this visit.  Subjective Assessment - 03/30/19 1622    Subjective  Doing ok today.No real pain currently.    Pertinent History  fall at work 2018.  Rt hip arthroscopy (01/06/2019)-[facility]    Currently in Pain?  No/denies    Multiple Pain Sites  No                       [facility] Adult PT Treatment/Exercise - 03/30/19 0001      Lumbar Exercises: Aerobic   UBE (Upper Arm Bike)  L3 3x3 with PTA present to discuss status      Shoulder Exercises: Supine   Protraction  Strengthening;Right;20 reps;Weights   2x10   Protraction Weight (lbs)  3      Shoulder Exercises: Seated   Other Seated Exercises  1# 3 way raise 10x       Shoulder Exercises: Prone   Horizontal ABduction 1  Strengthening;Both;20 reps;Weights    Horizontal ABduction 1 Weight (lbs)  1    Horizontal ABduction 2  --   Thumb up 1# 2x10    Other Prone Exercises  "y" ex with 1# thumb up 2x10         Shoulder Exercises: ROM/Strengthening   Plank  30 seconds;2 reps    Plank Limitations  Red band isometric ER of shoulder       Shoulder Exercises: Stretch   Other Shoulder Stretches  Thoracic extensions 10x, shoulder flexion stretch and rocking on soft foam roll for gentle mobilizations               PT Short Term Goals - 03/28/19 [id number]      PT SHORT TERM GOAL #1   Title  be indpendent in initial HEP    Status  Achieved      PT SHORT TERM GOAL #2   Title  report a 30% reduction in Rt shoulder pain and scapular tingling with use of the Rt UE    Baseline  50% less frequent    Status  Achieved      PT SHORT TERM GOAL #3   Title  demonstrate use of the Rt UE without scapular elevation    Baseline  still elevating with functional use  Time  4    Period  Weeks    Status  On-going        PT Long Term Goals - 03/02/19 0916      PT LONG TERM GOAL #1   Title  be independent in advanced HEP    Time  8    Period  Weeks    Status  New    Target Date  04/27/19      PT LONG TERM GOAL #2   Title  reduce FOTO to < or = to 22% limitation    Time  8    Period  Weeks    Status  New    Target Date  04/27/19      PT LONG TERM GOAL #3   Title  report > or = to 70% reduction in Rt UE pain with use of Rt UE    Time  8    Period  Weeks    Status  New    Target Date  04/27/19      PT LONG TERM GOAL #4   Title  demonstrate 5/5 Rt shoulder external rotation and extension to improve postural endurance to reduce anterior Rt shoulder pain    Time  8    Period  Weeks    Status  New    Target Date  04/27/19            Plan - 03/30/19 1621    Clinical Impression Statement  Pt arrives pain free today for PT. Focus was on RT scapular strength. Pt tolerated increased weights/resistance today without increasing her pain. Pt demonstrates RT middle trap weakness.    Personal Factors and Comorbidities  Comorbidity 1    Comorbidities  s/p Rt hip arthroscopy 12/2018    PT  Frequency  2x / week    PT Duration  12 weeks    PT Treatment/Interventions  ADLs/Self Care Home Management;Electrical Stimulation;Cryotherapy;Moist Heat;Ultrasound;Neuromuscular re-education;Therapeutic activities;Therapeutic exercise;Patient/family education;Manual techniques;Joint Manipulations;Spinal Manipulations;Passive range of motion;Dry needling    PT Next Visit Plan  dry needling if seen by PT,  continue scapular strength and mobility    PT Home Exercise Plan  Access Code: [email or web address]    Consulted and Agree with Plan of Care  Patient       Patient will benefit from skilled therapeutic intervention in order to improve the following deficits and impairments:  Decreased activity tolerance, Decreased strength, Increased muscle spasms, Postural dysfunction, Pain  Visit Diagnosis: 1. Muscle weakness (generalized)   2. Pain in thoracic spine   3. Chronic right shoulder pain   4. Abnormal posture        Problem List Patient Active Problem List   Diagnosis Date Noted  . 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 Elevated blood pressure reading 12/13/2018  . Preop examination 12/13/2018  . Trigger point of right shoulder region 10/06/2018  . Right shoulder pain 06/15/2018  . Pain of left calf 12/30/2017  . Labral tear of hip joint 12/09/2017  . Nonallopathic lesion of thoracic region 07/01/2017  . Nonallopathic lesion of sacral region 07/01/2017  . Nonallopathic lesion of lumbosacral region 07/01/2017  . Acute bursitis of right shoulder 06/18/2017  . Scapular dyskinesis 06/18/2017  . Right hip pain 03/23/2017  . Constipation 04/08/2016  . Cervical intraepithelial neoplasia grade 2 02/10/2013  . Knee pain, right anterior 01/06/2013    Dorisann Schwanke, PTA 03/30/2019, 4:56 PM  [facility] [address], [place], [place],  [address] Phone: [phone]   Fax:  [phone]  Name: Robyn Long MRN:  [id number] Date of Birth: 01/18/1986

## 2019-04-01 ENCOUNTER — Ambulatory Visit (INDEPENDENT_AMBULATORY_CARE_PROVIDER_SITE_OTHER): Payer: 59 | Admitting: Psychology

## 2019-04-01 DIAGNOSIS — F332 Major depressive disorder, recurrent severe without psychotic features: Secondary | ICD-10-CM

## 2019-04-04 ENCOUNTER — Ambulatory Visit: Payer: Managed Care, Other (non HMO)

## 2019-04-04 ENCOUNTER — Other Ambulatory Visit: Payer: Self-pay

## 2019-04-04 DIAGNOSIS — M6281 Muscle weakness (generalized): Secondary | ICD-10-CM

## 2019-04-04 DIAGNOSIS — R293 Abnormal posture: Secondary | ICD-10-CM

## 2019-04-04 DIAGNOSIS — M546 Pain in thoracic spine: Secondary | ICD-10-CM | POA: Diagnosis not present

## 2019-04-04 DIAGNOSIS — M25511 Pain in right shoulder: Secondary | ICD-10-CM

## 2019-04-04 DIAGNOSIS — G8929 Other chronic pain: Secondary | ICD-10-CM

## 2019-04-04 NOTE — Therapy (Signed)
[facility] [address], [address] [place], [place], [address] Phone: [phone]   Fax:  [phone]  Physical Therapy Treatment  Patient Details  Name: Robyn MelterMichele Long MRN: [id number] Date of Birth: 07/13/1986 Referring Provider (PT): Antoine PrimasSmith, Zachary, MD   Encounter Date: 04/04/2019  PT End of Session - 04/04/19 1658    Visit Number  6    Date for PT Re-Evaluation  04/27/19    Authorization Type  [employer]    Authorization - Visit Number  6    Authorization - Number of Visits  60    PT Start Time  1621   dry needling   PT Stop Time  1658    PT Time Calculation (min)  37 min    Activity Tolerance  Patient tolerated treatment well    Behavior During Therapy  [facility] assessed/performed       Past Medical History:  Diagnosis Date  . Abnormal Pap smear of cervix 2014  . Allergy   . Arthritis   . Depression   . History of chicken pox     Past Surgical History:  Procedure Laterality Date  . CRYOTHERAPY  2014   for abnormal pap smear    There were no vitals filed for this visit.  Subjective Assessment - 04/04/19 1629    Subjective  My Rt shoulder is aggravated today.    Currently in Pain?  Yes    Pain Score  4     Pain Location  Shoulder    Pain Orientation  Right    Pain Descriptors / Indicators  Aching;Sore    Pain Type  Chronic pain    Pain Onset  More than a month ago    Pain Frequency  Intermittent    Aggravating Factors   dance, use of Rt UE    Pain Relieving Factors  dry needling, ice, pec stretch                       [facility] Adult PT Treatment/Exercise - 04/04/19 0001      Lumbar Exercises: Aerobic   UBE (Upper Arm Bike)  L3 3x3 with PTA present to discuss status      Shoulder Exercises: Prone   Horizontal ABduction 1  Strengthening;Both;20 reps;Weights    Horizontal ABduction 1 Weight (lbs)  1    Horizontal ABduction 2  --   Thumb up 1# 2x10    Other Prone Exercises  "y" ex  with 1# thumb up 2x10        Manual Therapy   Manual Therapy  Myofascial release;Soft tissue mobilization    Manual therapy comments  Rt posterior deltoid, thoracic parapsinals, subscapularis and rhomboids- paplation and assessment during dry needling       Trigger Point Dry Needling - 04/04/19 0001    Consent Given?  Yes    Muscles Treated Upper Quadrant  Subscapularis;Rhomboids;Deltoid   Rt only   Dry Needling Comments  thoracic multifidi    Rhomboids Response  Twitch response elicited;Palpable increased muscle length    Subscapularis Response  Twitch response elicited;Palpable increased muscle length    Deltoid Response  Twitch response elicited;Palpable increased muscle length             PT Short Term Goals - 03/28/19 0942      PT SHORT TERM GOAL #1   Title  be indpendent in initial HEP    Status  Achieved      PT  SHORT TERM GOAL #2   Title  report a 30% reduction in Rt shoulder pain and scapular tingling with use of the Rt UE    Baseline  50% less frequent    Status  Achieved      PT SHORT TERM GOAL #3   Title  demonstrate use of the Rt UE without scapular elevation    Baseline  still elevating with functional use    Time  4    Period  Weeks    Status  On-going        PT Long Term Goals - 03/02/19 0916      PT Long TERM GOAL #1   Title  be independent in advanced HEP    Time  8    Period  Weeks    Status  New    Target Date  04/27/19      PT Long TERM GOAL #2   Title  reduce FOTO to < or = to 22% limitation    Time  8    Period  Weeks    Status  New    Target Date  04/27/19      PT Long TERM GOAL #3   Title  report > or = to 70% reduction in Rt UE pain with use of Rt UE    Time  8    Period  Weeks    Status  New    Target Date  04/27/19      PT Long TERM GOAL #4   Title  demonstrate 5/5 Rt shoulder external rotation and extension to improve postural endurance to reduce anterior Rt shoulder pain    Time  8    Period  Weeks    Status  New     Target Date  04/27/19            Plan - 04/04/19 1634    Clinical Impression Statement  Pt continues to work to increase her Rt scapular strength and stability.  Pt arrives with increased aggravation of the Rt shoulder today.  Pt has started back to work and is using her arm in different ways.  Pt with anterior shoulder pain and MD suspects proximal biceps tendon tear.  Pt is able to perform scapular strength and stabilization exercises with good technique.  Pt with trigger points in Rt subscapularis, rhomboids and posterior deltoid.  Pt with improved tissue mobility overall since the start of care and improved tissue mobility after dry needling and manual therapy today.  Pt will continue to benefit from skilled PT for Rt scapular strength, thoracic flexibility and manual therapy.    PT Frequency  2x / week    PT Duration  12 weeks    PT Treatment/Interventions  ADLs/Self Care Home Management;Electrical Stimulation;Cryotherapy;Moist Heat;Ultrasound;Neuromuscular re-education;Therapeutic activities;Therapeutic exercise;Patient/family education;Manual techniques;Joint Manipulations;Spinal Manipulations;Passive range of motion;Dry needling    PT Next Visit Plan  assess response to dry needling, scapular strength and stability    PT Home Exercise Plan  Access Code: [email or web address]    Consulted and Agree with Plan of Care  Patient       Patient will benefit from skilled therapeutic intervention in order to improve the following deficits and impairments:  Decreased activity tolerance, Decreased strength, Increased muscle spasms, Postural dysfunction, Pain  Visit Diagnosis: Muscle weakness (generalized)  Pain in thoracic spine  Chronic right shoulder pain  Abnormal posture     Problem List Patient Active Problem List   Diagnosis Date Noted  . Os  acromiale of right shoulder 02/07/2019  . Elevated blood pressure reading 12/13/2018  . Preop examination 12/13/2018  . Trigger point of right  shoulder region 10/06/2018  . Right shoulder pain 06/15/2018  . Pain of left calf 12/30/2017  . Labral tear of hip joint 12/09/2017  . Nonallopathic lesion of thoracic region 07/01/2017  . Nonallopathic lesion of sacral region 07/01/2017  . Nonallopathic lesion of lumbosacral region 07/01/2017  . Acute bursitis of right shoulder 06/18/2017  . Scapular dyskinesis 06/18/2017  . Right hip pain 03/23/2017  . Constipation 04/08/2016  . Cervical intraepithelial neoplasia grade 2 02/10/2013  . Knee pain, right anterior 01/06/2013    Lorrene ReidKelly , PT 04/04/19 5:02 PM  [facility] [address], [address] [place], [place], [address] Phone: [phone]   Fax:  [phone]  Name: Robyn MelterMichele Long MRN: [id number] Date of Birth: 12/11/1985

## 2019-04-06 ENCOUNTER — Encounter: Payer: Self-pay | Admitting: Physical Therapy

## 2019-04-06 ENCOUNTER — Other Ambulatory Visit: Payer: Self-pay

## 2019-04-06 ENCOUNTER — Ambulatory Visit: Payer: Managed Care, Other (non HMO) | Admitting: Physical Therapy

## 2019-04-06 DIAGNOSIS — M6281 Muscle weakness (generalized): Secondary | ICD-10-CM

## 2019-04-06 DIAGNOSIS — G8929 Other chronic pain: Secondary | ICD-10-CM

## 2019-04-06 DIAGNOSIS — R293 Abnormal posture: Secondary | ICD-10-CM

## 2019-04-06 DIAGNOSIS — M546 Pain in thoracic spine: Secondary | ICD-10-CM

## 2019-04-06 DIAGNOSIS — M25511 Pain in right shoulder: Secondary | ICD-10-CM

## 2019-04-06 NOTE — Therapy (Signed)
[facility] [address], [place], [place], [address] Phone: [phone]   Fax:  [phone]  Physical Therapy Treatment  Patient Details  Name: Robyn Long MRN: [id number] Date of Birth: 19-Jun-1986 Referring Provider (PT): Hulan Saas, MD   Encounter Date: 04/06/2019  PT End of Session - 04/06/19 1617    Visit Number  7    Date for PT Re-Evaluation  04/27/19    Authorization Type  [employer]    Authorization - Visit Number  7    Authorization - Number of Visits  60    PT Start Time  4431    PT Stop Time  1654    PT Time Calculation (min)  38 min    Activity Tolerance  Patient tolerated treatment well    Behavior During Therapy  [facility] for tasks assessed/performed       Past Medical History:  Diagnosis Date  . Abnormal Pap smear of cervix 2014  . Allergy   . Arthritis   . Depression   . History of chicken pox     Past Surgical History:  Procedure Laterality Date  . CRYOTHERAPY  2014   for abnormal pap smear    There were no vitals filed for this visit.  Subjective Assessment - 04/06/19 1618    Subjective  The needling really helped, especially the next day. No pain right now.    Pertinent History  fall at work 2018.  Rt hip arthroscopy (01/06/2019)-still current in PT at [facility]    Currently in Pain?  No/denies    Multiple Pain Sites  No                       [facility] Adult PT Treatment/Exercise - 04/06/19 0001      Lumbar Exercises: Aerobic   UBE (Upper Arm Bike)  L3 3x3 with Robyn Long present to discuss status      Lumbar Exercises: Standing   Other Standing Lumbar Exercises  D2 ext 10x at cable 2 plates       Lumbar Exercises: Quadruped   Single Arm Raise  Left;5 reps    Single Arm Raise Weights (lbs)  VC to stabilize more at RT scapula      Shoulder Exercises: Prone   Horizontal ABduction 1  Strengthening;Right;20 reps;Weights   2x10, no pain   Horizontal ABduction 1 Weight  (lbs)  3    Horizontal ABduction 2  --   Thumb up 3# x10,  10x 2# 2nd set      Shoulder Exercises: ROM/Strengthening   Lat Pull  2 plate;20 reps    Plank  30 seconds;2 reps    Plank Limitations  Red band isometric ER of shoulder                PT Short Term Goals - 03/28/19 5400      PT SHORT TERM GOAL #1   Title  be indpendent in initial HEP    Status  Achieved      PT SHORT TERM GOAL #2   Title  report a 30% reduction in Rt shoulder pain and scapular tingling with use of the Rt UE    Baseline  50% less frequent    Status  Achieved      PT SHORT TERM GOAL #3   Title  demonstrate use of the Rt UE without scapular elevation    Baseline  still elevating with functional use    Time  4    Period  Weeks    Status  On-going        PT Long Term Goals - 03/02/19 0916      PT LONG TERM GOAL #1   Title  be independent in advanced HEP    Time  8    Period  Weeks    Status  New    Target Date  04/27/19      PT LONG TERM GOAL #2   Title  reduce FOTO to < or = to 22% limitation    Time  8    Period  Weeks    Status  New    Target Date  04/27/19      PT LONG TERM GOAL #3   Title  report > or = to 70% reduction in Rt UE pain with use of Rt UE    Time  8    Period  Weeks    Status  New    Target Date  04/27/19      PT LONG TERM GOAL #4   Title  demonstrate 5/5 Rt shoulder external rotation and extension to improve postural endurance to reduce anterior Rt shoulder pain    Time  8    Period  Weeks    Status  New    Target Date  04/27/19            Plan - 04/06/19 1618    Clinical Impression Statement  Pt presents today with no pain. Dry needling really helped reduce her pain last session.Pt reports at home she has been using 3# wts bc that is all she has. we looked at this today since we had been using just 1#. Robyn Long recommended she NOT use the 3# at home. Pt has difficulty with scapula stability in quadruped. 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 These techniques are to aide increase her awareness at her scapula and not 'dump" her weight into her wrists. Shoulder soreness did not increase too much exercises.    Personal Factors and Comorbidities  Comorbidity 1    Comorbidities  s/p Rt hip arthroscopy 12/2018    Rehab Potential  Excellent    PT Frequency  2x / week    PT Duration  12 weeks    PT Treatment/Interventions  ADLs/Self Care Home Management;Electrical Stimulation;Cryotherapy;Moist Heat;Ultrasound;Neuromuscular re-education;Therapeutic activities;Therapeutic exercise;Patient/family education;Manual techniques;Joint Manipulations;Spinal Manipulations;Passive range of motion;Dry needling    PT Next Visit Plan  scapular strength and stability    PT Home Exercise Plan  Access Code: [email or web address]    Consulted and Agree with Plan of Care  Patient       Patient will benefit from skilled therapeutic intervention in order to improve the following deficits and impairments:  Decreased activity tolerance, Decreased strength, Increased muscle spasms, Postural dysfunction, Pain  Visit Diagnosis: Muscle weakness (generalized)  Pain in thoracic spine  Chronic right shoulder pain  Abnormal posture     Problem List Patient Active Problem List   Diagnosis Date Noted  . 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 Elevated blood pressure reading 12/13/2018  . Preop examination 12/13/2018  . Trigger point of right shoulder region 10/06/2018  . Right shoulder pain 06/15/2018  . Pain of left calf 12/30/2017  . Labral tear of hip joint 12/09/2017  . Nonallopathic lesion of thoracic region 07/01/2017  . Nonallopathic lesion of sacral region 07/01/2017  . Nonallopathic lesion of lumbosacral region 07/01/2017  . Acute bursitis of right  shoulder 06/18/2017  . Scapular dyskinesis 06/18/2017  . Right hip pain 03/23/2017  . Constipation 04/08/2016  . Cervical intraepithelial neoplasia grade 2 02/10/2013  . Knee pain, right anterior  01/06/2013    Robyn Long, Robyn Long 04/06/2019, 5:00 PM  [facility] [address], [address] [place], [place], [address] Phone: [phone]   Fax:  [phone]  Name: Robyn Long MRN: [id number] Date of Birth: 09/10/1985

## 2019-04-08 ENCOUNTER — Ambulatory Visit: Payer: 59 | Admitting: Psychology

## 2019-04-11 ENCOUNTER — Ambulatory Visit: Payer: Managed Care, Other (non HMO)

## 2019-04-11 IMAGING — US US PELVIS COMPLETE
1 series · 14 of 25 positions shown · non-contrast
Comparison: None

CLINICAL DATA: Right lower quadrant pain for 1 month.

EXAM:
TRANSABDOMINAL AND TRANSVAGINAL ULTRASOUND OF PELVIS
TECHNIQUE: Both transabdominal and transvaginal ultrasound examinations of the
pelvis were performed. Transabdominal technique was performed for
global imaging of the pelvis including uterus, ovaries, adnexal
regions, and pelvic cul-de-sac. It was necessary to proceed with
endovaginal exam following the transabdominal exam to visualize the
endometrium and ovaries.

[Series 1: us pelvis complete · 0.23mm/px · 14 of 65 slices shown]
[im 1/65]
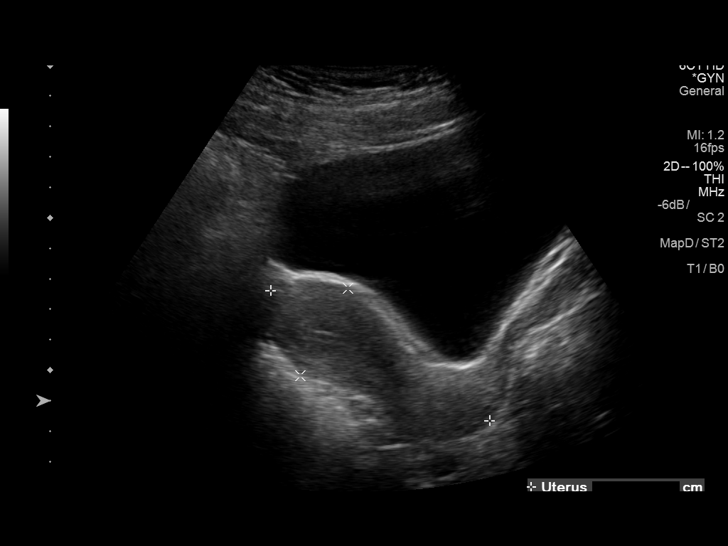
[im 6/65]
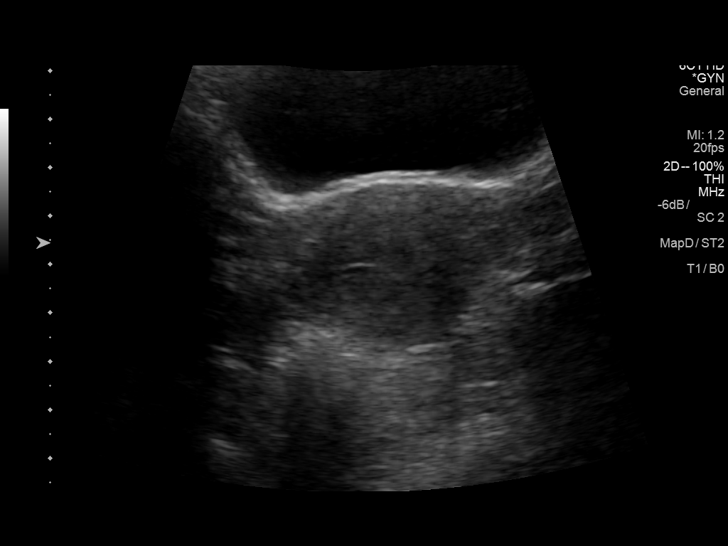
[im 11/65]
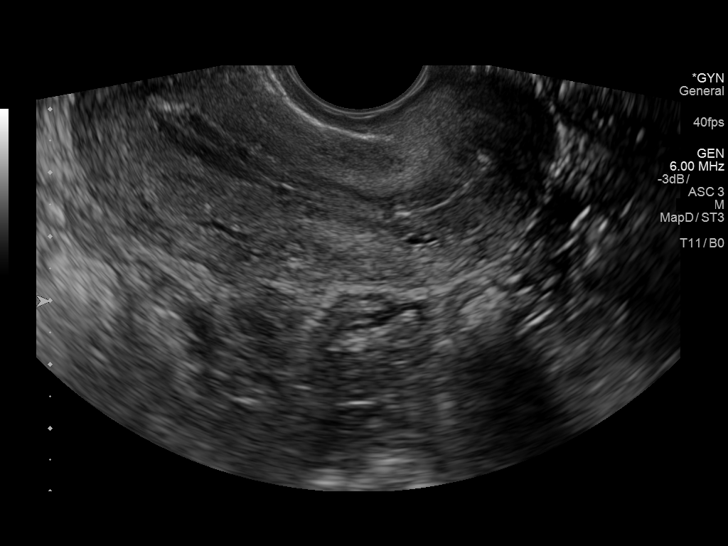
[im 17/65]
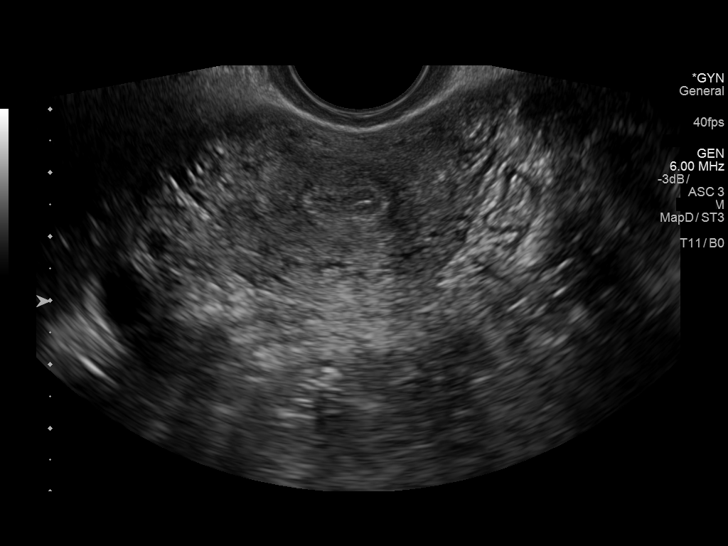
[im 22/65]
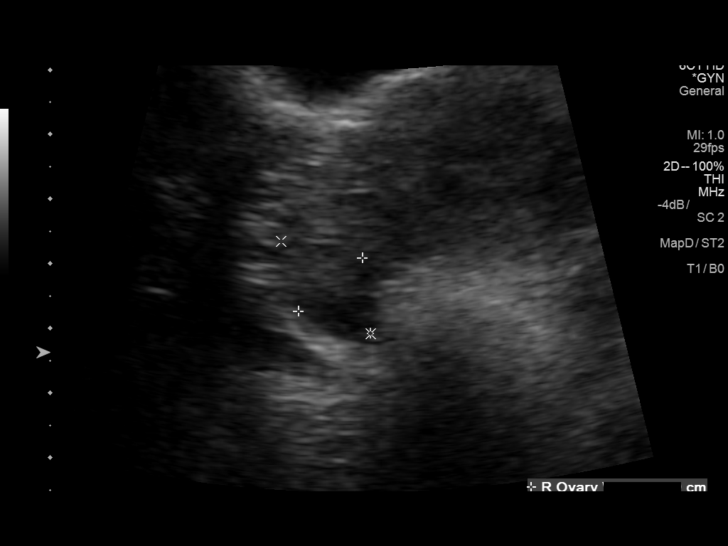
[im 25/65]
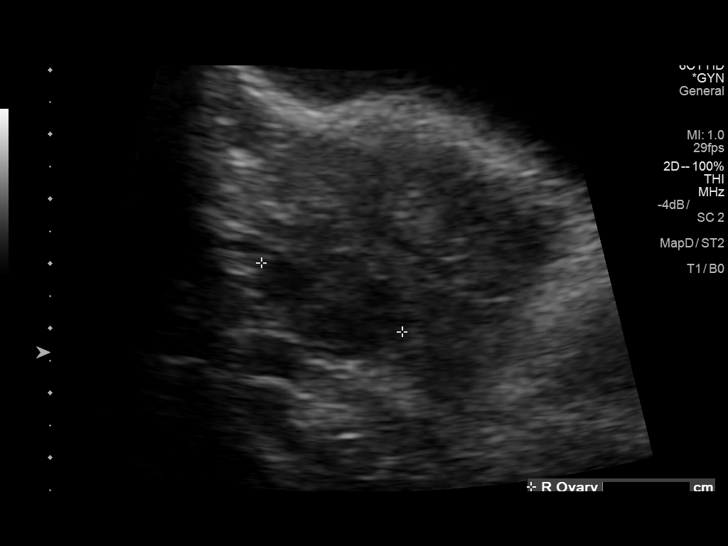
[im 30/65]
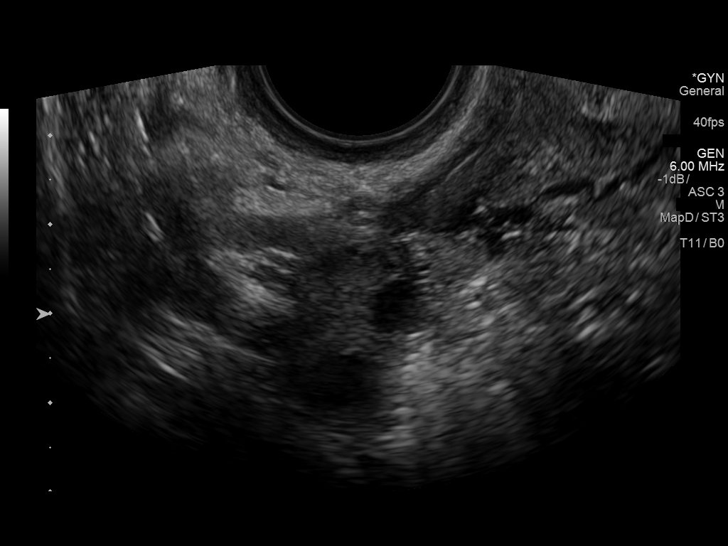
[im 35/65]
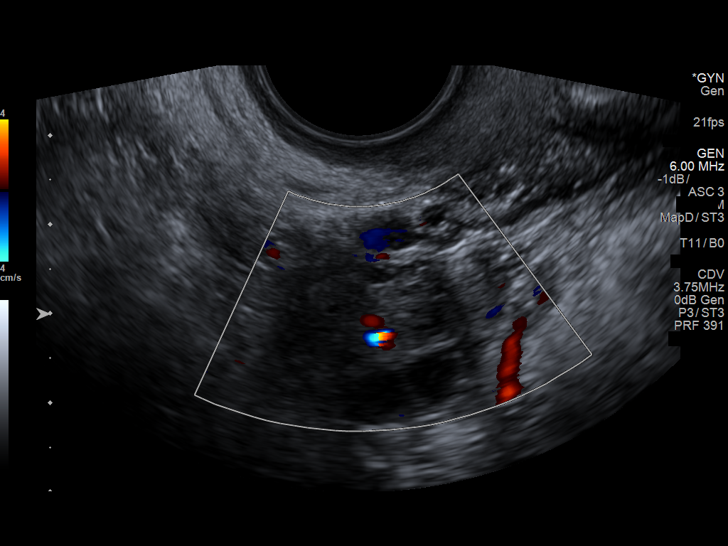
[im 41/65]
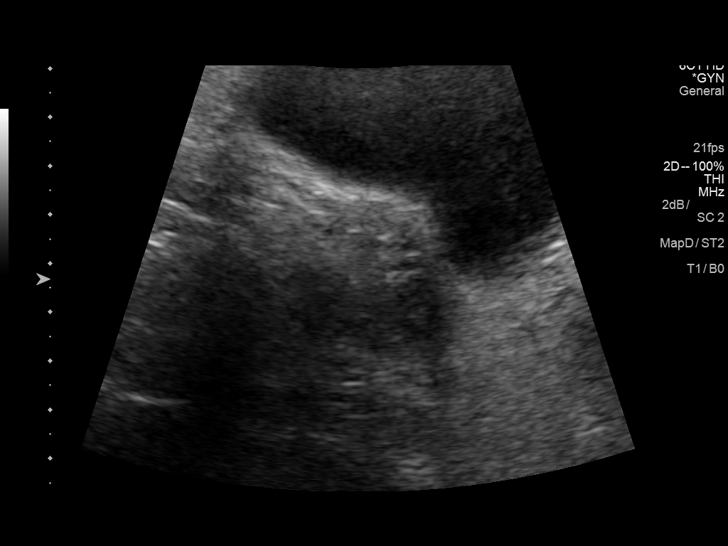
[im 43/65]
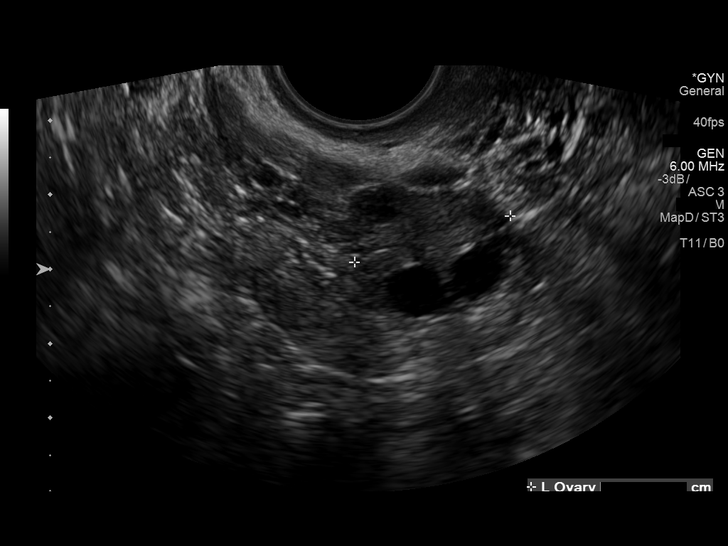
[im 49/65]
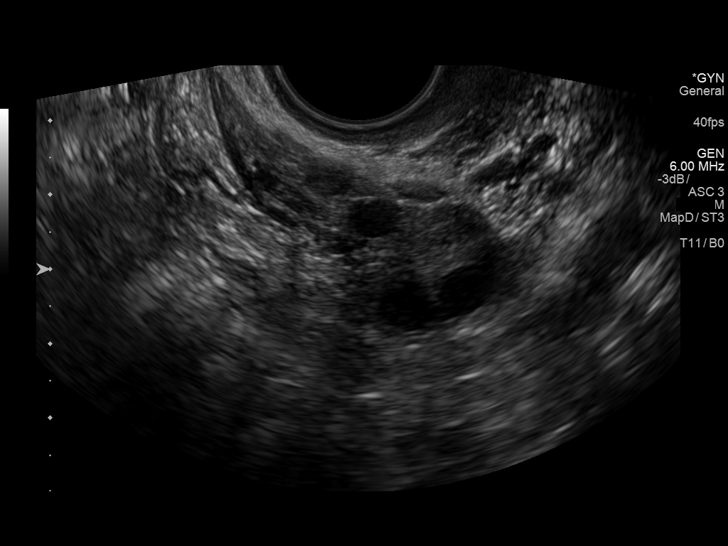
[im 54/65]
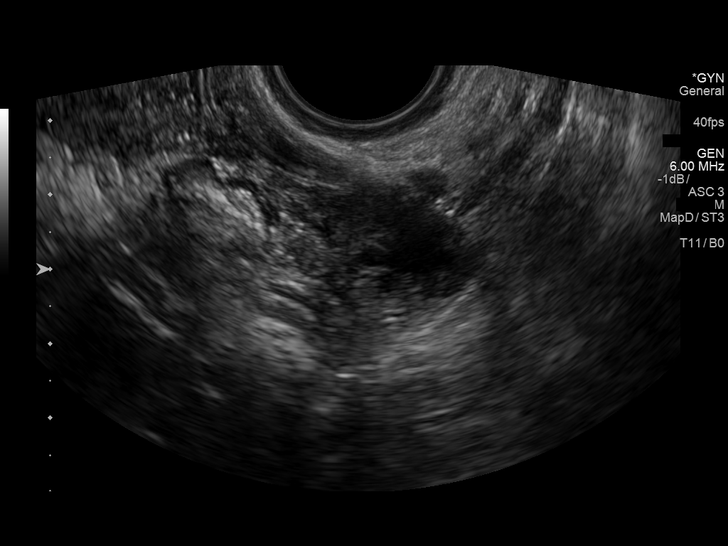
[im 59/65]
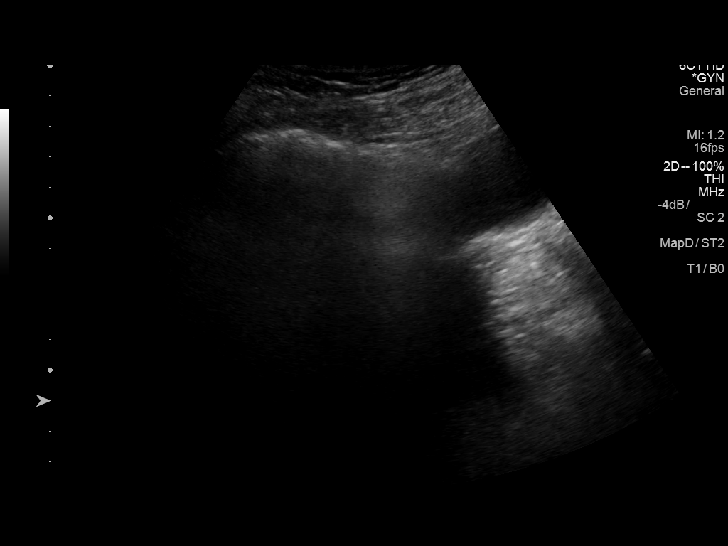
[im 65/65]
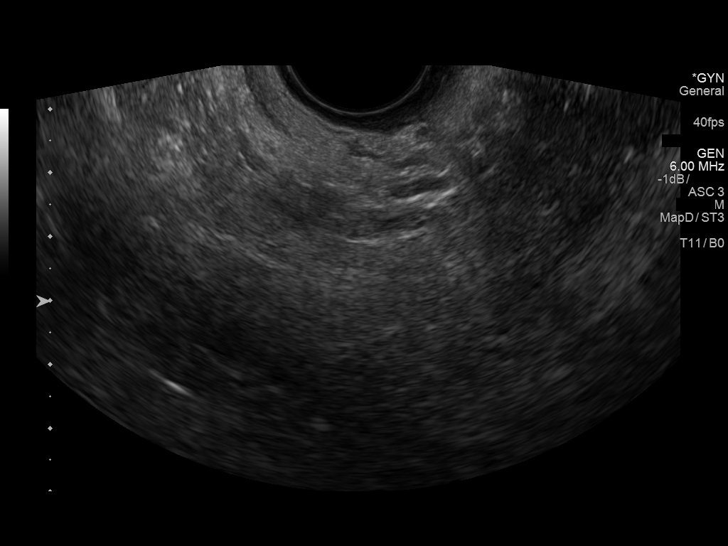

[14 of 25 positions shown; findings below may reference images not displayed]

FINDINGS: Uterus

Measurements: 8.4 x 3.3 x 4.4 cm. No fibroids or other mass
visualized.

Endometrium

Thickness: 5.1 mm.  No focal abnormality visualized.

Right ovary

Measurements: 2.5 x 2.2 x 1.4 cm. Contains multiple normal appearing
follicles.

Left ovary

Measurements: 2.2 x 2.6 x 1.8 cm. Contains multiple follicles. An
irregular cystic region measuring 14 mm is likely a ruptured or
hemorrhagic follicle.

Other findings

No abnormal free fluid.
IMPRESSION: 1. No cause for right lower quadrant pain identified.

## 2019-04-13 ENCOUNTER — Other Ambulatory Visit: Payer: Self-pay

## 2019-04-13 ENCOUNTER — Ambulatory Visit: Payer: Managed Care, Other (non HMO) | Attending: Family Medicine | Admitting: Physical Therapy

## 2019-04-13 ENCOUNTER — Encounter: Payer: Self-pay | Admitting: Physical Therapy

## 2019-04-13 DIAGNOSIS — G8929 Other chronic pain: Secondary | ICD-10-CM | POA: Diagnosis present

## 2019-04-13 DIAGNOSIS — M25511 Pain in right shoulder: Secondary | ICD-10-CM | POA: Diagnosis present

## 2019-04-13 DIAGNOSIS — M6281 Muscle weakness (generalized): Secondary | ICD-10-CM | POA: Insufficient documentation

## 2019-04-13 DIAGNOSIS — M546 Pain in thoracic spine: Secondary | ICD-10-CM | POA: Insufficient documentation

## 2019-04-13 DIAGNOSIS — R293 Abnormal posture: Secondary | ICD-10-CM | POA: Insufficient documentation

## 2019-04-13 NOTE — Therapy (Signed)
[facility] [address], [address] [place], [place], [address] Phone: [phone]   Fax:  [phone]  Physical Therapy Treatment  Patient Details  Name: Robyn Long MRN: [id number] Date of Birth: 03/25/1986 Referring Provider (PT): Antoine PrimasSmith, Zachary, MD   Encounter Date: 04/13/2019  PT End of Session - 04/13/19 1620    Visit Number  8    Date for PT Re-Evaluation  04/27/19    Authorization Type  [employer]    Authorization - Visit Number  8    Authorization - Number of Visits  60    PT Start Time  1618    PT Stop Time  1659    PT Time Calculation (min)  41 min    Activity Tolerance  Patient tolerated treatment well    Behavior During Therapy  [facility] for tasks assessed/performed       Past Medical History:  Diagnosis Date  . Abnormal Pap smear of cervix 2014  . Allergy   . Arthritis   . Depression   . History of chicken pox     Past Surgical History:  Procedure Laterality Date  . CRYOTHERAPY  2014   for abnormal pap smear    There were no vitals filed for this visit.  Subjective Assessment - 04/13/19 1622    Subjective  I felt good after last session. I have had a good day.    Pertinent History  fall at work 2018.  Rt hip arthroscopy (01/06/2019)-[facility]    Currently in Pain?  No/denies    Multiple Pain Sites  No                       [facility] Adult PT Treatment/Exercise - 04/13/19 0001      Shoulder Exercises: Prone   Extension  Strengthening;Right;10 reps;Weights    Extension Weight (lbs)  3    Horizontal ABduction 1  Strengthening;Right;Weights   3x10   Horizontal ABduction 1 Weight (lbs)  3      Shoulder Exercises: Standing   Other Standing Exercises  RTUE bank robbers off the wall 2x10, added to HEP      Shoulder Exercises: ROM/Strengthening   UBE (Upper Arm Bike)  Forwad L 2, Reverse L2.5 3x3 with discussion about pain/current status    Lat Pull  3 plate;20 reps     Lat Pull Limitations  VC to depress at humeral head    Cybex Row  3 plate;20 reps    Plank  30 seconds;2 reps    Plank Limitations  Red band isometric ER of shoulder     Other ROM/Strengthening Exercises  Standing body Blade ossilations 30 sec 2x vertical hold & horizontal     Other ROM/Strengthening Exercises  Quadruped body blade horizontal  Lt stabilize RT 30 sec 2x      Shoulder Exercises: Stretch   Other Shoulder Stretches  Thread needle stretch b/t sets             PT Education - 04/13/19 1657    Education Details  Single arm bank robbers for [employer]    Person(s) Educated  Patient    Methods  Explanation;Demonstration;Tactile cues;Verbal cues;Handout    Comprehension  Verbalized understanding;Returned demonstration       PT Short Term Goals - 03/28/19 0942      PT SHORT TERM GOAL #1   Title  be indpendent in initial HEP    Status  Achieved  PT SHORT TERM GOAL #2   Title  report a 30% reduction in Rt shoulder pain and scapular tingling with use of the Rt UE    Baseline  50% less frequent    Status  Achieved      PT SHORT TERM GOAL #3   Title  demonstrate use of the Rt UE without scapular elevation    Baseline  still elevating with functional use    Time  4    Period  Weeks    Status  On-going        PT Long Term Goals - 03/02/19 0916      PT LONG TERM GOAL #1   Title  be independent in advanced HEP    Time  8    Period  Weeks    Status  New    Target Date  04/27/19      PT LONG TERM GOAL #2   Title  reduce FOTO to < or = to 22% limitation    Time  8    Period  Weeks    Status  New    Target Date  04/27/19      PT LONG TERM GOAL #3   Title  report > or = to 70% reduction in Rt UE pain with use of Rt UE    Time  8    Period  Weeks    Status  New    Target Date  04/27/19      PT LONG TERM GOAL #4   Title  demonstrate 5/5 Rt shoulder external rotation and extension to improve postural endurance to reduce anterior Rt shoulder pain    Time  8     Period  Weeks    Status  New    Target Date  04/27/19            Plan - 04/13/19 1620    Clinical Impression Statement  Pt tolerating the current shoulder strengthening and stabilization program well, no increases in shouder pain. Pt is doing better not elevating her scapula with her exercises yet she still holds tension in her upper trap until cued to release it. Added overhead lower trap strengthening to HEP today. pt reports her "lower, medial muscles feel absent."    Personal Factors and Comorbidities  Comorbidity 1    Comorbidities  s/p Rt hip arthroscopy 12/2018    Rehab Potential  Excellent    PT Frequency  2x / week    PT Treatment/Interventions  ADLs/Self Care Home Management;Electrical Stimulation;Cryotherapy;Moist Heat;Ultrasound;Neuromuscular re-education;Therapeutic activities;Therapeutic exercise;Patient/family education;Manual techniques;Joint Manipulations;Spinal Manipulations;Passive range of motion;Dry needling    PT Next Visit Plan  scapular strength and stability, review single arm bank robbers given today.    PT Home Exercise Plan  Access Code: [email or web address]    Consulted and Agree with Plan of Care  Patient       Patient will benefit from skilled therapeutic intervention in order to improve the following deficits and impairments:  Decreased activity tolerance, Decreased strength, Increased muscle spasms, Postural dysfunction, Pain  Visit Diagnosis: Muscle weakness (generalized)  Pain in thoracic spine  Chronic right shoulder pain  Abnormal posture     Problem List Patient Active Problem List   Diagnosis Date Noted  . 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 Elevated blood pressure reading 12/13/2018  . Preop examination 12/13/2018  . Trigger point of right shoulder region 10/06/2018  . Right shoulder pain 06/15/2018  . Pain of left calf 12/30/2017  .  Labral tear of hip joint 12/09/2017  . Nonallopathic lesion of thoracic region 07/01/2017  .  Nonallopathic lesion of sacral region 07/01/2017  . Nonallopathic lesion of lumbosacral region 07/01/2017  . Acute bursitis of right shoulder 06/18/2017  . Scapular dyskinesis 06/18/2017  . Right hip pain 03/23/2017  . Constipation 04/08/2016  . Cervical intraepithelial neoplasia grade 2 02/10/2013  . Knee pain, right anterior 01/06/2013    Kace Hartje, PTA 04/13/2019, 5:01 PM  [facility] [address], [address] [place], [place], [address] Phone: [phone]   Fax:  [phone]  Name: Rigby Grove MRN: [id number] Date of Birth: 03/28/1986 Access Code: [email or web address]  URL: [email or web address]  Date: 04/13/2019  Prepared by: Ane Payment   Exercises  Cat-Camel - 10 reps - 1 sets - 5 hold - 3x daily - 7x weekly  Standing Shoulder External Rotation with Resistance - 10 reps - 2x daily - 7x weekly  Standing Shoulder External Rotation with Resistance - 10 reps - 1 sets - 2x daily - 7x weekly  Standing Shoulder Horizontal Abduction with Resistance - 10 reps - 3 sets - 1x daily - 7x weekly  Sidelying Open Book Thoracic Rotation with Knee on Foam Roll - 10 reps - 3 hold - 3x daily - 7x weekly  Supine Single Arm Shoulder Protraction - 5 reps - 2 sets - 2x daily - 7x weekly  Single Arm Low Trap Setting at Wall - 10 reps - 2 sets - 5 hold - 2x daily - 7x weekly

## 2019-04-15 ENCOUNTER — Ambulatory Visit (INDEPENDENT_AMBULATORY_CARE_PROVIDER_SITE_OTHER): Payer: 59 | Admitting: Psychology

## 2019-04-15 DIAGNOSIS — F332 Major depressive disorder, recurrent severe without psychotic features: Secondary | ICD-10-CM

## 2019-04-20 ENCOUNTER — Ambulatory Visit: Payer: Managed Care, Other (non HMO)

## 2019-04-20 ENCOUNTER — Other Ambulatory Visit: Payer: Self-pay

## 2019-04-20 DIAGNOSIS — M6281 Muscle weakness (generalized): Secondary | ICD-10-CM | POA: Diagnosis not present

## 2019-04-20 DIAGNOSIS — R293 Abnormal posture: Secondary | ICD-10-CM

## 2019-04-20 DIAGNOSIS — G8929 Other chronic pain: Secondary | ICD-10-CM

## 2019-04-20 DIAGNOSIS — M546 Pain in thoracic spine: Secondary | ICD-10-CM

## 2019-04-20 NOTE — Therapy (Signed)
[facility] [address] [place], [place], [address] Phone: [phone]   Fax:  [phone]  Physical Therapy Treatment  Patient Details  Name: Robyn Long MRN: [id number] Date of Birth: [birth year] Referring Provider (PT): Hulan Saas, MD   Encounter Date: 04/20/2019  PT End of Session - 04/20/19 1101    Visit Number  9    Date for PT Re-Evaluation  04/27/19    Authorization Type  [employer]    Authorization - Visit Number  9    Authorization - Number of Visits  60    PT Start Time  1023   dry needling   PT Stop Time  1100    PT Time Calculation (min)  37 min    Activity Tolerance  Patient tolerated treatment well    Behavior During Therapy  [facility] for tasks assessed/performed       Past Medical History:  Diagnosis Date  . Abnormal Pap smear of cervix 2014  . Allergy   . Arthritis   . Depression   . History of chicken pox     Past Surgical History:  Procedure Laterality Date  . CRYOTHERAPY  2014   for abnormal pap smear    There were no vitals filed for this visit.  Subjective Assessment - 04/20/19 1026    Subjective  I think that the dry needling help.  I am doing my exercises regularly.  I have a flare up with cleaning.    Currently in Pain?  No/denies   up to 5-6/10 after cleaning   Pain Location  Shoulder    Pain Orientation  Right                       [facility] Adult PT Treatment/Exercise - 04/20/19 0001      Lumbar Exercises: Aerobic   UBE (Upper Arm Bike)  L2.5  3x3 with PT present to discuss status      Shoulder Exercises: ROM/Strengthening   Other ROM/Strengthening Exercises  Standing body Blade ossilations 30 sec 2x vertical hold & horizontal       Manual Therapy   Manual Therapy  Myofascial release;Soft tissue mobilization    Manual therapy comments  Rt posterior deltoid, thoracic parapsinals, subscapularis and rhomboids- paplation and assessment during dry needling       Trigger Point Dry Needling - 04/20/19 0001    Consent Given?  Yes    Muscles Treated Upper Quadrant  Subscapularis;Rhomboids;Deltoid   Rt only   Dry Needling Comments  thoracic multifidi    Rhomboids Response  Twitch response elicited;Palpable increased muscle length    Subscapularis Response  Twitch response elicited;Palpable increased muscle length    Deltoid Response  Twitch response elicited;Palpable increased muscle length             PT Short Term Goals - 03/28/19 0942      PT SHORT TERM GOAL #1   Title  be indpendent in initial HEP    Status  Achieved      PT SHORT TERM GOAL #2   Title  report a 30% reduction in Rt shoulder pain and scapular tingling with use of the Rt UE    Baseline  50% less frequent    Status  Achieved      PT SHORT TERM GOAL #3   Title  demonstrate use of the Rt UE without scapular elevation    Baseline  still elevating with functional use  Time  4    Period  Weeks    Status  On-going        PT Long Term Goals - 03/02/19 0916      PT LONG TERM GOAL #1   Title  be independent in advanced HEP    Time  8    Period  Weeks    Status  New    Target Date  04/27/19      PT LONG TERM GOAL #2   Title  reduce FOTO to < or = to 22% limitation    Time  8    Period  Weeks    Status  New    Target Date  04/27/19      PT LONG TERM GOAL #3   Title  report > or = to 70% reduction in Rt UE pain with use of Rt UE    Time  8    Period  Weeks    Status  New    Target Date  04/27/19      PT LONG TERM GOAL #4   Title  demonstrate 5/5 Rt shoulder external rotation and extension to improve postural endurance to reduce anterior Rt shoulder pain    Time  8    Period  Weeks    Status  New    Target Date  04/27/19            Plan - 04/20/19 1036    Clinical Impression Statement  Pt reports 60% overall improvement in symptoms since the start of care. Pt tolerating the current shoulder strengthening and stabilization program well without  increases in shouder pain. Pt is doing better not elevating her scapula with her exercises yet she still holds tension in her upper trap until cued to release it. Pt with increased Rt anterior shoulder pain and scapular tingling after doing her house cleaning.  PT advised pt to alternate between Rt&Lt and work on scapular retraction with cleaning. Pt with tension and trigger points in Rt posterior deltoid, rhomboids and thoracic spine.  Pt with reduced tension and trigger points after manual therapy today.    Rehab Potential  Excellent    PT Frequency  2x / week    PT Duration  12 weeks    PT Treatment/Interventions  ADLs/Self Care Home Management;Electrical Stimulation;Cryotherapy;Moist Heat;Ultrasound;Neuromuscular re-education;Therapeutic activities;Therapeutic exercise;Patient/family education;Manual techniques;Joint Manipulations;Spinal Manipulations;Passive range of motion;Dry needling    PT Next Visit Plan  reassessement next week.  1x/wk for 4 weeks probable to focus on dry needling and advancement of strength.    PT Home Exercise Plan  Access Code: [email or web address]    Consulted and Agree with Plan of Care  Patient       Patient will benefit from skilled therapeutic intervention in order to improve the following deficits and impairments:  Decreased activity tolerance, Decreased strength, Increased muscle spasms, Postural dysfunction, Pain  Visit Diagnosis: Pain in thoracic spine  Muscle weakness (generalized)  Chronic right shoulder pain  Abnormal posture     Problem List Patient Active Problem List   Diagnosis Date Noted  . 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 Elevated blood pressure reading 12/13/2018  . Preop examination 12/13/2018  . Trigger point of right shoulder region 10/06/2018  . Right shoulder pain 06/15/2018  . Pain of left calf 12/30/2017  . Labral tear of hip joint 12/09/2017  . Nonallopathic lesion of thoracic region 07/01/2017  . Nonallopathic lesion of  sacral region 07/01/2017  . Nonallopathic lesion of  lumbosacral region 07/01/2017  . Acute bursitis of right shoulder 06/18/2017  . Scapular dyskinesis 06/18/2017  . Right hip pain 03/23/2017  . Constipation 04/08/2016  . Cervical intraepithelial neoplasia grade 2 02/10/2013  . Knee pain, right anterior 01/06/2013     Lorrene Reid, PT 04/20/19 11:03 AM  [facility] [address], [address] [place], [place], [address] Phone: [phone]   Fax:  [phone]  Name: Naureen Drace MRN: [id number] Date of Birth: 03-14-1986

## 2019-04-22 ENCOUNTER — Ambulatory Visit: Payer: 59 | Admitting: Psychology

## 2019-04-25 ENCOUNTER — Other Ambulatory Visit: Payer: Self-pay

## 2019-04-25 ENCOUNTER — Encounter: Payer: Self-pay | Admitting: Physical Therapy

## 2019-04-25 ENCOUNTER — Ambulatory Visit: Payer: Managed Care, Other (non HMO) | Admitting: Physical Therapy

## 2019-04-25 DIAGNOSIS — G8929 Other chronic pain: Secondary | ICD-10-CM

## 2019-04-25 DIAGNOSIS — M6281 Muscle weakness (generalized): Secondary | ICD-10-CM

## 2019-04-25 DIAGNOSIS — M546 Pain in thoracic spine: Secondary | ICD-10-CM

## 2019-04-25 DIAGNOSIS — M25511 Pain in right shoulder: Secondary | ICD-10-CM

## 2019-04-25 DIAGNOSIS — R293 Abnormal posture: Secondary | ICD-10-CM

## 2019-04-25 NOTE — Therapy (Signed)
[facility] [address] [place], [place], [address] Phone: [phone]   Fax:  [phone]  Physical Therapy Treatment  Patient Details  Name: Robyn Long MRN: [id number] Date of Birth: 24-Jul-1986 Referring Provider (PT): Antoine Primas, MD   Encounter Date: 04/25/2019  PT End of Session - 04/25/19 1023    Visit Number  10    Date for PT Re-Evaluation  04/27/19    Authorization Type  [employer]    Authorization - Visit Number  10    Authorization - Number of Visits  60    PT Start Time  1022   Pt late   PT Stop Time  1058    PT Time Calculation (min)  36 min    Activity Tolerance  Patient tolerated treatment well    Behavior During Therapy  [facility] for tasks assessed/performed       Past Medical History:  Diagnosis Date  . Abnormal Pap smear of cervix 2014  . Allergy   . Arthritis   . Depression   . History of chicken pox     Past Surgical History:  Procedure Laterality Date  . CRYOTHERAPY  2014   for abnormal pap smear    There were no vitals filed for this visit.  Subjective Assessment - 04/25/19 1025    Subjective  A little sore this AM but i am more active. I was sore after the needling. The next few days it did feel better.    Pertinent History  fall at work 2018.  Rt hip arthroscopy (01/06/2019)-still current in PT at [facility]    Currently in Pain?  Yes    Pain Score  2     Pain Location  Shoulder    Pain Orientation  Right    Pain Descriptors / Indicators  Aching    Aggravating Factors   A lot of use    Pain Relieving Factors  DN, ice, pec stretch    Multiple Pain Sites  No                       [facility] Adult PT Treatment/Exercise - 04/25/19 0001      Lumbar Exercises: Aerobic   UBE (Upper Arm Bike)  L2.5  3x3 with PTA present to discuss status      Shoulder Exercises: Standing   Other Standing Exercises  Yellow band overhead  "V' lift 10x2       Shoulder Exercises:  ROM/Strengthening   Lat Pull  --   3 plates 37S, 4 plates 28B    Pushups  --   Countertop pushups, elbows close 2x10   Pushups Limitations  VC for core     Plank  30 seconds;2 reps    Plank Limitations  Red band isometric ER of shoulder     Other ROM/Strengthening Exercises  Standing body Blade ossilations 40 sec 2x vertical hold & horizontal     Other ROM/Strengthening Exercises  Body blade with overhead flexion ( full range) 10x                PT Short Term Goals - 03/28/19 1517      PT SHORT TERM GOAL #1   Title  be indpendent in initial HEP    Status  Achieved      PT SHORT TERM GOAL #2   Title  report a 30% reduction in Rt shoulder pain and scapular tingling with use of the Rt UE  Baseline  50% less frequent    Status  Achieved      PT SHORT TERM GOAL #3   Title  demonstrate use of the Rt UE without scapular elevation    Baseline  still elevating with functional use    Time  4    Period  Weeks    Status  On-going        PT Long Term Goals - 03/02/19 0916      PT LONG TERM GOAL #1   Title  be independent in advanced HEP    Time  8    Period  Weeks    Status  New    Target Date  04/27/19      PT LONG TERM GOAL #2   Title  reduce FOTO to < or = to 22% limitation    Time  8    Period  Weeks    Status  New    Target Date  04/27/19      PT LONG TERM GOAL #3   Title  report > or = to 70% reduction in Rt UE pain with use of Rt UE    Time  8    Period  Weeks    Status  New    Target Date  04/27/19      PT LONG TERM GOAL #4   Title  demonstrate 5/5 Rt shoulder external rotation and extension to improve postural endurance to reduce anterior Rt shoulder pain    Time  8    Period  Weeks    Status  New    Target Date  04/27/19            Plan - 04/25/19 1033    Clinical Impression Statement  Pt reports still doing well overall. She still demonstrates scapular weakness and fatigues easily with more advanced exercises like todays counter top push  ups and body blade overhead. Pt will have manual muscle test next session during her re-evaluation.    Personal Factors and Comorbidities  Comorbidity 1    Comorbidities  s/p Rt hip arthroscopy 12/2018    Rehab Potential  Excellent    PT Frequency  2x / week    PT Duration  12 weeks    PT Treatment/Interventions  ADLs/Self Care Home Management;Electrical Stimulation;Cryotherapy;Moist Heat;Ultrasound;Neuromuscular re-education;Therapeutic activities;Therapeutic exercise;Patient/family education;Manual techniques;Joint Manipulations;Spinal Manipulations;Passive range of motion;Dry needling    PT Next Visit Plan  ERO next, MMT posterior RT shoulder    PT Home Exercise Plan  Access Code: [email or web address]    Consulted and Agree with Plan of Care  Patient       Patient will benefit from skilled therapeutic intervention in order to improve the following deficits and impairments:  Decreased activity tolerance, Decreased strength, Increased muscle spasms, Postural dysfunction, Pain  Visit Diagnosis: Pain in thoracic spine  Muscle weakness (generalized)  Chronic right shoulder pain  Abnormal posture     Problem List Patient Active Problem List   Diagnosis Date Noted  . 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 Elevated blood pressure reading 12/13/2018  . Preop examination 12/13/2018  . Trigger point of right shoulder region 10/06/2018  . Right shoulder pain 06/15/2018  . Pain of left calf 12/30/2017  . Labral tear of hip joint 12/09/2017  . Nonallopathic lesion of thoracic region 07/01/2017  . Nonallopathic lesion of sacral region 07/01/2017  . Nonallopathic lesion of lumbosacral region 07/01/2017  . Acute bursitis of right shoulder 06/18/2017  . Scapular dyskinesis  06/18/2017  . Right hip pain 03/23/2017  . Constipation 04/08/2016  . Cervical intraepithelial neoplasia grade 2 02/10/2013  . Knee pain, right anterior 01/06/2013    Melany Wiesman, PTA 04/25/2019, 10:58 AM  [facility] [address] [place], [place], [address] Phone: [phone]   Fax:  [phone]  Name: Annmarie Plemmons MRN: [id number] Date of Birth: [birth year]

## 2019-04-27 ENCOUNTER — Ambulatory Visit: Payer: Managed Care, Other (non HMO)

## 2019-04-27 ENCOUNTER — Other Ambulatory Visit: Payer: Self-pay

## 2019-04-27 DIAGNOSIS — M6281 Muscle weakness (generalized): Secondary | ICD-10-CM | POA: Diagnosis not present

## 2019-04-27 DIAGNOSIS — M25511 Pain in right shoulder: Secondary | ICD-10-CM

## 2019-04-27 DIAGNOSIS — R293 Abnormal posture: Secondary | ICD-10-CM

## 2019-04-27 DIAGNOSIS — G8929 Other chronic pain: Secondary | ICD-10-CM

## 2019-04-27 DIAGNOSIS — M546 Pain in thoracic spine: Secondary | ICD-10-CM

## 2019-04-27 NOTE — Therapy (Signed)
[facility] [address], [address] [place], [place], [address] Phone: [phone]   Fax:  [phone]  Physical Therapy Treatment  Patient Details  Name: Flint MelterMichele Fatheree MRN: [id number] Date of Birth: 12/23/1985 Referring Provider (PT): Antoine PrimasSmith, Zachary, MD   Encounter Date: 04/27/2019  PT End of Session - 04/27/19 1100    Visit Number  11    Date for PT Re-Evaluation  06/08/19    Authorization Type  C[employer]    Authorization - Visit Number  11    Authorization - Number of Visits  60    PT Start Time  1017    PT Stop Time  1100    PT Time Calculation (min)  43 min    Activity Tolerance  Patient tolerated treatment well    Behavior During Therapy  [facility] for tasks assessed/performed       Past Medical History:  Diagnosis Date  . Abnormal Pap smear of cervix 2014  . Allergy   . Arthritis   . Depression   . History of chicken pox     Past Surgical History:  Procedure Laterality Date  . CRYOTHERAPY  2014   for abnormal pap smear    There were no vitals filed for this visit.  Subjective Assessment - 04/27/19 1025    Subjective  I was flared up on Sunday and it still hasnt gone away.  50-60% overall improvement since the start of care.    Currently in Pain?  Yes    Pain Score  5     Pain Location  Shoulder    Pain Orientation  Right    Pain Descriptors / Indicators  Aching    Pain Type  Chronic pain    Pain Onset  More than a month ago    Pain Frequency  Intermittent    Aggravating Factors   use of Rt UE, sometimes just random         [facility] PT Assessment - 04/27/19 0001      Assessment   Medical Diagnosis  scapular dyskinesis    Referring Provider (PT)  Antoine PrimasSmith, Zachary, MD    Onset Date/Surgical Date  04/01/17      Prior Function   Level of Independence  Independent    Vocation Requirements  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[employer]    Leisure  yoga      Observation/Other Assessments   Focus on  Therapeutic Outcomes (FOTO)   23% limitation      Posture/Postural Control   Posture/Postural Control  Postural limitations    Postural Limitations  Rounded Shoulders      Strength   Overall Strength  Deficits    Strength Assessment Site  Shoulder    Right Shoulder Flexion  4+/5    Right Shoulder Extension  4+/5    Right Shoulder ABduction  4+/5    Right Shoulder Internal Rotation  5/5    Right Shoulder External Rotation  4+/5      Palpation   Spinal mobility  reduced segmental mobility T8-L1 with mild pain, reduced PA mobility T3-6 with pain    Palpation comment  mild palpable tenderness over Rt lat, lower trap, anterior deltoid and upper  traps      Transfers   Transfers  Independent with all Transfers                   [facility] Adult PT Treatment/Exercise - 04/27/19 0001      Lumbar  Exercises: Aerobic   UBE (Upper Arm Bike)  L2.5  3x3 with PT present to discuss status      Manual Therapy   Manual Therapy  Myofascial release;Soft tissue mobilization    Manual therapy comments  Rt posterior deltoid, thoracic parapsinals, subscapularis and rhomboids- paplation and assessment during dry needling       Trigger Point Dry Needling - 04/27/19 0001    Consent Given?  Yes    Muscles Treated Upper Quadrant  Subscapularis;Rhomboids;Deltoid   Rt only   Dry Needling Comments  thoracic multifidi    Rhomboids Response  Twitch response elicited;Palpable increased muscle length    Subscapularis Response  Twitch response elicited;Palpable increased muscle length    Deltoid Response  Twitch response elicited;Palpable increased muscle length             PT Short Term Goals - 03/28/19 0942      PT SHORT TERM GOAL #1   Title  be indpendent in initial HEP    Status  Achieved      PT SHORT TERM GOAL #2   Title  report a 30% reduction in Rt shoulder pain and scapular tingling with use of the Rt UE    Baseline  50% less frequent    Status  Achieved      PT SHORT TERM  GOAL #3   Title  demonstrate use of the Rt UE without scapular elevation    Baseline  still elevating with functional use    Time  4    Period  Weeks    Status  On-going        PT Long Term Goals - 04/27/19 1036      PT LONG TERM GOAL #1   Title  be independent in advanced HEP    Time  6    Period  Weeks    Status  On-going    Target Date  06/08/19      PT LONG TERM GOAL #2   Title  reduce FOTO to < or = to 22% limitation    Baseline  23% limitation    Time  6    Period  Weeks    Status  On-going    Target Date  06/08/19      PT LONG TERM GOAL #3   Title  report > or = to 70% reduction in Rt UE pain with use of Rt UE    Baseline  50-60%    Time  6    Period  Weeks    Status  On-going    Target Date  06/08/19      PT LONG TERM GOAL #4   Title  demonstrate 5/5 Rt shoulder external rotation and extension to improve postural endurance to reduce anterior Rt shoulder pain    Baseline  4+/5    Time  6    Period  Weeks    Status  On-going    Target Date  06/08/19            Plan - 04/27/19 1038    Clinical Impression Statement  Pt reports 50-60% overall improvement in symptoms since the start of care. She continues to  demonstrate scapular weakness and fatigues easily with more advanced exercises in the clinic  Pt is able to participate in higher level strength exercises. Pt with improved tension and trigger points in the Rt scapular region although improved from evaluation.  Pt will continue to benefit from dry needling and mobilization to  normalize this tissue while working on scapular strength progression.  Pt with improved Rt shoulder extension and ER strength and FOTO is 23% limitation.  Pt will continue to benefit from skilled PT to address scapular strength, thoracic and scapular mobility.    Rehab Potential  Good    PT Frequency  2x / week    PT Duration  6 weeks    PT Treatment/Interventions  ADLs/Self Care Home Management;Electrical  Stimulation;Cryotherapy;Moist Heat;Ultrasound;Neuromuscular re-education;Therapeutic activities;Therapeutic exercise;Patient/family education;Manual techniques;Joint Manipulations;Spinal Manipulations;Passive range of motion;Dry needling    PT Next Visit Plan  dry needling, maual, flexibility and advance scapular strength    PT Home Exercise Plan  Access Code: [email or web address]    Recommended Other Services  recet sent 04/27/19    Consulted and Agree with Plan of Care  Patient       Patient will benefit from skilled therapeutic intervention in order to improve the following deficits and impairments:  Decreased activity tolerance, Decreased strength, Increased muscle spasms, Postural dysfunction, Pain  Visit Diagnosis: Pain in thoracic spine - Plan: PT plan of care cert/re-cert  Muscle weakness (generalized) - Plan: PT plan of care cert/re-cert  Chronic right shoulder pain - Plan: PT plan of care cert/re-cert  Abnormal posture - Plan: PT plan of care cert/re-cert     Problem List Patient Active Problem List   Diagnosis Date Noted  . 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 Elevated blood pressure reading 12/13/2018  . Preop examination 12/13/2018  . Trigger point of right shoulder region 10/06/2018  . Right shoulder pain 06/15/2018  . Pain of left calf 12/30/2017  . Labral tear of hip joint 12/09/2017  . Nonallopathic lesion of thoracic region 07/01/2017  . Nonallopathic lesion of sacral region 07/01/2017  . Nonallopathic lesion of lumbosacral region 07/01/2017  . Acute bursitis of right shoulder 06/18/2017  . Scapular dyskinesis 06/18/2017  . Right hip pain 03/23/2017  . Constipation 04/08/2016  . Cervical intraepithelial neoplasia grade 2 02/10/2013  . Knee pain, right anterior 01/06/2013     Sigurd Sos, PT 04/27/19 11:02 AM  [facility] [address] [place], [place], [address] Phone: [phone]   Fax:   [phone]  Name: Jamilya Sarrazin MRN: [id number] Date of Birth: May 22, 1986

## 2019-04-29 ENCOUNTER — Ambulatory Visit (INDEPENDENT_AMBULATORY_CARE_PROVIDER_SITE_OTHER): Payer: 59 | Admitting: Psychology

## 2019-04-29 DIAGNOSIS — F332 Major depressive disorder, recurrent severe without psychotic features: Secondary | ICD-10-CM | POA: Diagnosis not present

## 2019-05-01 NOTE — Assessment & Plan Note (Signed)
Discussed with patient in great length.  Responds fairly well to manipulation  Decision today to treat with OMT was based on Physical Exam  After verbal consent patient was treated with HVLA, ME, FPR techniques in cervical, thoracic, lumbar and sacral areas  Patient tolerated the procedure well with improvement in symptoms  Patient given exercises, stretches and lifestyle modifications  See medications in patient instructions if given  Patient will follow up in 4-8 weeks

## 2019-05-01 NOTE — Progress Notes (Signed)
Robyn ScaleZach Jermany Long D.O. [facility] [address] [place], [place] [address] Phone: [phone] Subjective:   I Robyn NighKana Long am serving as a Neurosurgeonscribe for Dr. Antoine PrimasZachary Zehra Long.   CC: Right shoulder pain, back pain follow-up  BJY:[id number]:Subjective  Robyn MelterMichele Long is a 33 y.o. female coming in with complaint of back and shoulder pain. States that she is doing better.  Patient has been doing relatively well overall though.  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  Nothing that is stopping her from activity.  Some mild discomfort at the end of a Long day.      Past Medical History:  Diagnosis Date  . Abnormal Pap smear of cervix 2014  . Allergy   . Arthritis   . Depression   . History of chicken pox    Past Surgical History:  Procedure Laterality Date  . CRYOTHERAPY  2014   for abnormal pap smear   Social History   Socioeconomic History  . Marital status: Married    Spouse name: Not on file  . Number of children: Not on file  . Years of education: 16+  . Highest education level: Not on file  Occupational History  . Occupation: Associate Professorerver/Freelance Dancer    Employer: [employer]  Social Needs  . Financial resource strain: Not on file  . Food insecurity    Worry: Not on file    Inability: Not on file  . Transportation needs    Medical: Not on file    Non-medical: Not on file  Tobacco Use  . Smoking status: Never Smoker  . Smokeless tobacco: Never Used  Substance and Sexual Activity  . Alcohol use: Yes    Alcohol/week: 3.0 - 4.0 standard drinks    Types: 3 - 4 Standard drinks or equivalent per week  . Drug use: No  . Sexual activity: Yes    Partners: Male    Birth control/protection: Condom  Lifestyle  . Physical activity    Days per week: Not on file    Minutes per session: Not on file  . Stress: Not on file  Relationships  . Social Musicianconnections    Talks on phone: Not on file    Gets together: Not on file    Attends religious service: Not on  file    Active member of club or organization: Not on file    Attends meetings of clubs or organizations: Not on file    Relationship status: Not on file  Other Topics Concern  . Not on file  Social History Narrative   Caffeine Use-yes   Regular exercise-yes   No Known Allergies Family History  Problem Relation Age of Onset  . Mitral valve prolapse Father   . Hypertension Mother   . Colon polyps Mother   . Breast cancer Maternal Aunt 40       deceased from breast cancer  . Lung cancer Paternal Grandfather   . Mitral valve prolapse Paternal Aunt   . Lung cancer Maternal Grandfather   . Mitral valve prolapse Paternal Aunt   . Colon cancer Neg Hx          Current Outpatient Medications (Other):  .  gabapentin (NEURONTIN) 300 MG capsule, 1 PO q HS, may increase to 1 PO BID if needed .  Glucosamine Sulfate (SYNOVACIN PO), Glucosamine  1 q day .  glucosamine-chondroitin 500-400 MG tablet, Take 1 tablet by mouth daily. .  Multiple Vitamins-Minerals (WOMENS MULTIVITAMIN PLUS) TABS, Take  1 tablet by mouth daily.    Past medical history, social, surgical and family history all reviewed in electronic medical record.  No pertanent information unless stated regarding to the chief complaint.   Review of Systems:  No headache, visual changes, nausea, vomiting, diarrhea, constipation, dizziness, abdominal pain, skin rash, fevers, chills, night sweats, weight loss, swollen lymph nodes, body aches, joint swelling,, chest pain, shortness of breath, mood changes.  Positive muscle aches  Objective  Blood pressure 120/84, pulse 84, height 5\' 10"  (1.778 m), weight 205 lb (93 kg), SpO2 91 %.    General: No apparent distress alert and oriented x3 mood and affect normal, dressed appropriately.  HEENT: Pupils equal, extraocular movements intact  Respiratory: Patient's speak in full sentences and does not appear short of breath  Cardiovascular: No lower extremity edema, non tender, no erythema   Skin: Warm dry intact with no signs of infection or rash on extremities or on axial skeleton.  Abdomen: Soft nontender  Neuro: Cranial nerves II through XII are intact, neurovascularly intact in all extremities with 2+ DTRs and 2+ pulses.  Lymph: No lymphadenopathy of posterior or anterior cervical chain or axillae bilaterally.  Gait normal with good balance and coordination.  MSK:  Non tender with full range of motion and good stability and symmetric strength and tone of  elbows, wrist, hip, knee and ankles bilaterally.  Significant breast discharge noted. Shoulder: Right Inspection reveals no abnormalities, atrophy or asymmetry. Palpation is normal with no tenderness over AC joint or bicipital groove. ROM is full in all planes. Rotator cuff strength normal throughout. No signs of impingement with negative Neer and Hawkin's tests, empty can sign. Speeds and Yergason's tests normal. No labral pathology noted with negative Obrien's, negative clunk and good stability. Normal scapular function observed. No painful arc and no drop arm sign. No apprehension sign Contralateral shoulder unremarkable  Back Exam:  Inspection: Unremarkable  Motion: Flexion 45 deg, Extension 25 deg, Side Bending to 30 deg bilaterally,  Rotation to 45 deg bilaterally  SLR laying: Negative  XSLR laying: Negative  Palpable tenderness: Tightness in the thoracolumbar juncture right greater than left and some of the right sacroiliac joint. FABER: negative. Sensory change: Gross sensation intact to all lumbar and sacral dermatomes.  Reflexes: 2+ at both patellar tendons, 2+ at achilles tendons, Babinski's downgoing.  Strength at foot  Plantar-flexion: 5/5 Dorsi-flexion: 5/5 Eversion: 5/5 Inversion: 5/5  Leg strength  Quad: 5/5 Hamstring: 5/5 Hip flexor: 5/5 Hip abductors: 5/5  Gait unremarkable.  Osteopathic findings  C4 flexed rotated and side bent left C6 flexed rotated and side bent left T7 extended rotated  and side bent right inhaled rib L1 flexed rotated and side bent left Sacrum right on right     Impression and Recommendations:     This case required medical decision making of moderate complexity. The above documentation has been reviewed and is accurate and complete Lyndal Pulley, DO       Note: This dictation was prepared with Dragon dictation along with smaller phrase technology. Any transcriptional errors that result from this process are unintentional.

## 2019-05-02 ENCOUNTER — Other Ambulatory Visit: Payer: Self-pay

## 2019-05-02 ENCOUNTER — Ambulatory Visit (INDEPENDENT_AMBULATORY_CARE_PROVIDER_SITE_OTHER): Payer: Managed Care, Other (non HMO) | Admitting: Family Medicine

## 2019-05-02 ENCOUNTER — Encounter: Payer: Self-pay | Admitting: Family Medicine

## 2019-05-02 VITALS — BP 120/84 | HR 84 | Ht 70.0 in | Wt 205.0 lb

## 2019-05-02 DIAGNOSIS — N63 Unspecified lump in unspecified breast: Secondary | ICD-10-CM | POA: Diagnosis not present

## 2019-05-02 DIAGNOSIS — G2589 Other specified extrapyramidal and movement disorders: Secondary | ICD-10-CM | POA: Diagnosis not present

## 2019-05-02 DIAGNOSIS — M999 Biomechanical lesion, unspecified: Secondary | ICD-10-CM

## 2019-05-02 NOTE — Patient Instructions (Signed)
Good to see you Continue to work on posture See me again in 6-8 weeks

## 2019-05-02 NOTE — Assessment & Plan Note (Signed)
We discussed patient with continuing possibility of breast reduction surgery.  Do believe the upper back pain is from some of the this is secondary to this issue contributing to some of the discomfort and pain.

## 2019-05-02 NOTE — Assessment & Plan Note (Signed)
Continues to have some difficulty.  Discussed posture and ergonomics syndrome.  Patient also does have significant amount of breast tissue that could be contributing.  Discussed about the possibility of breast reduction and probably 5

## 2019-05-05 ENCOUNTER — Other Ambulatory Visit: Payer: Self-pay

## 2019-05-05 ENCOUNTER — Ambulatory Visit: Payer: Managed Care, Other (non HMO)

## 2019-05-05 DIAGNOSIS — R293 Abnormal posture: Secondary | ICD-10-CM

## 2019-05-05 DIAGNOSIS — M6281 Muscle weakness (generalized): Secondary | ICD-10-CM | POA: Diagnosis not present

## 2019-05-05 DIAGNOSIS — M546 Pain in thoracic spine: Secondary | ICD-10-CM

## 2019-05-05 DIAGNOSIS — G8929 Other chronic pain: Secondary | ICD-10-CM

## 2019-05-05 NOTE — Patient Instructions (Signed)
Access Code: [email or web address]  URL: [email or web address]  Date: 05/05/2019  Prepared by: Sigurd Sos   Exercises   Prone Shoulder External Rotation - 10 reps - 2 sets - 2x daily - 7x weekly

## 2019-05-05 NOTE — Therapy (Signed)
[facility] [address] [place], [place], [address] Phone: [phone]   Fax:  [phone]  Physical Therapy Treatment  Patient Details  Name: Robyn Long MRN: [id number] Date of Birth: 14-Nov-1985 Referring Provider (PT): Hulan Saas, MD   Encounter Date: 05/05/2019  PT End of Session - 05/05/19 1222    Visit Number  12    Date for PT Re-Evaluation  06/08/19    Authorization Type  Cigna    Authorization - Visit Number  12    Authorization - Number of Visits  60    PT Start Time  0539    PT Stop Time  1221    PT Time Calculation (min)  46 min    Activity Tolerance  Patient tolerated treatment well    Behavior During Therapy  [facility] for tasks assessed/performed       Past Medical History:  Diagnosis Date  . Abnormal Pap smear of cervix 2014  . Allergy   . Arthritis   . Depression   . History of chicken pox     Past Surgical History:  Procedure Laterality Date  . CRYOTHERAPY  2014   for abnormal pap smear    There were no vitals filed for this visit.  Subjective Assessment - 05/05/19 1138    Subjective  I have been very busy with work.  I had 1.5 days of flare-up of the Rt shoulder blade but that is pretty normal.    Currently in Pain?  Yes    Pain Score  0-No pain   5/10 max for 1.5 days   Pain Location  Scapula    Pain Orientation  Right    Pain Descriptors / Indicators  Aching    Pain Type  Chronic pain    Pain Onset  More than a month ago    Pain Frequency  Intermittent    Aggravating Factors   random, stress, use of Rt UE    Pain Relieving Factors  DN, ice, pec stretch                       [facility] Adult PT Treatment/Exercise - 05/05/19 0001      Lumbar Exercises: Aerobic   UBE (Upper Arm Bike)  L2.5  3x3 with PT present to discuss status      Shoulder Exercises: Prone   External Rotation  Strengthening;Right;20 reps      Shoulder Exercises: ROM/Strengthening    Other ROM/Strengthening Exercises  Standing body Blade ossilations 40 sec 2x vertical hold & horizontal     Other ROM/Strengthening Exercises  Body blade with overhead flexion ( full range) 10x       Manual Therapy   Manual Therapy  Myofascial release;Soft tissue mobilization    Manual therapy comments  Rt posterior deltoid, thoracic parapsinals, subscapularis and rhomboids- paplation and assessment during dry needling       Trigger Point Dry Needling - 05/05/19 0001    Consent Given?  Yes    Muscles Treated Upper Quadrant  Subscapularis;Rhomboids;Deltoid   Rt only   Dry Needling Comments  thoracic multifidi    Rhomboids Response  Twitch response elicited;Palpable increased muscle length    Subscapularis Response  Twitch response elicited;Palpable increased muscle length    Deltoid Response  Twitch response elicited;Palpable increased muscle length           PT Education - 05/05/19 1156    Education Details  Access Code: [email or web address]  Person(s) Educated  Patient    Methods  Explanation;Demonstration;Handout;Tactile cues    Comprehension  Verbalized understanding;Returned demonstration       PT Short Term Goals - 03/28/19 0942      PT SHORT TERM GOAL #1   Title  be indpendent in initial HEP    Status  Achieved      PT SHORT TERM GOAL #2   Title  report a 30% reduction in Rt shoulder pain and scapular tingling with use of the Rt UE    Baseline  50% less frequent    Status  Achieved      PT SHORT TERM GOAL #3   Title  demonstrate use of the Rt UE without scapular elevation    Baseline  still elevating with functional use    Time  4    Period  Weeks    Status  On-going        PT Long Term Goals - 04/27/19 1036      PT LONG TERM GOAL #1   Title  be independent in advanced HEP    Time  6    Period  Weeks    Status  On-going    Target Date  06/08/19      PT LONG TERM GOAL #2   Title  reduce FOTO to < or = to 22% limitation    Baseline  23% limitation    Time  6     Period  Weeks    Status  On-going    Target Date  06/08/19      PT LONG TERM GOAL #3   Title  report > or = to 70% reduction in Rt UE pain with use of Rt UE    Baseline  50-60%    Time  6    Period  Weeks    Status  On-going    Target Date  06/08/19      PT LONG TERM GOAL #4   Title  demonstrate 5/5 Rt shoulder external rotation and extension to improve postural endurance to reduce anterior Rt shoulder pain    Baseline  4+/5    Time  6    Period  Weeks    Status  On-going    Target Date  06/08/19            Plan - 05/05/19 1142    Clinical Impression Statement  Pt reports still doing well overall with typical pain cycle of 1-2 days of pain this week. Pt continues to demonstrate scapular weakness and fatigues easily with more advanced exercises like body blade overhead.  Pt expressed difficulty with external rotation motion with functional tasks.  Pt with full ER passively and demonstrates scapular challenge with this motion in prone.  This was added to HEP. Pt with improved tension and trigger points in the Rt scapular region although improved from evaluation.  Pt demonstrated improved tissue mobility and reduced trigger points after dry needling today. Pt will continue to benefit from dry needling and mobilization to normalize this tissue while working on scapular strength progression.    PT Frequency  2x / week    PT Duration  6 weeks    PT Treatment/Interventions  ADLs/Self Care Home Management;Electrical Stimulation;Cryotherapy;Moist Heat;Ultrasound;Neuromuscular re-education;Therapeutic activities;Therapeutic exercise;Patient/family education;Manual techniques;Joint Manipulations;Spinal Manipulations;Passive range of motion;Dry needling    PT Next Visit Plan  dry needling, maual, flexibility and advance scapular strength    PT Home Exercise Plan  Access Code: [email or web address]    Recommended  Other Services  recert is signed    Consulted and Agree with Plan of Care  Patient        Patient will benefit from skilled therapeutic intervention in order to improve the following deficits and impairments:  Decreased activity tolerance, Decreased strength, Increased muscle spasms, Postural dysfunction, Pain  Visit Diagnosis: Pain in thoracic spine  Muscle weakness (generalized)  Chronic right shoulder pain  Abnormal posture     Problem List Patient Active Problem List   Diagnosis Date Noted  . Large mass of breast 05/02/2019  . 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 Elevated blood pressure reading 12/13/2018  . Preop examination 12/13/2018  . Trigger point of right shoulder region 10/06/2018  . Right shoulder pain 06/15/2018  . Pain of left calf 12/30/2017  . Labral tear of hip joint 12/09/2017  . Nonallopathic lesion of thoracic region 07/01/2017  . Nonallopathic lesion of sacral region 07/01/2017  . Nonallopathic lesion of lumbosacral region 07/01/2017  . Acute bursitis of right shoulder 06/18/2017  . Scapular dyskinesis 06/18/2017  . Right hip pain 03/23/2017  . Constipation 04/08/2016  . Cervical intraepithelial neoplasia grade 2 02/10/2013  . Knee pain, right anterior 01/06/2013     Lorrene Reid, PT 05/05/19 12:24 PM  [facility] [address] [place], [place], [address] Phone: [phone]   Fax:  [phone]  Name: Robyn Long MRN: [id number] Date of Birth: 1986/02/25

## 2019-05-06 ENCOUNTER — Ambulatory Visit: Payer: 59 | Admitting: Psychology

## 2019-05-11 ENCOUNTER — Other Ambulatory Visit: Payer: Self-pay

## 2019-05-11 ENCOUNTER — Ambulatory Visit: Payer: Managed Care, Other (non HMO)

## 2019-05-11 DIAGNOSIS — M25511 Pain in right shoulder: Secondary | ICD-10-CM

## 2019-05-11 DIAGNOSIS — M6281 Muscle weakness (generalized): Secondary | ICD-10-CM

## 2019-05-11 DIAGNOSIS — M546 Pain in thoracic spine: Secondary | ICD-10-CM

## 2019-05-11 DIAGNOSIS — R293 Abnormal posture: Secondary | ICD-10-CM

## 2019-05-11 DIAGNOSIS — G8929 Other chronic pain: Secondary | ICD-10-CM

## 2019-05-11 NOTE — Therapy (Signed)
[facility] [address] [place], [place], [address] Phone: [phone]   Fax:  [phone]  Physical Therapy Treatment  Patient Details  Name: Robyn Long MRN: [id number] Date of Birth: 09-May-1986 Referring Provider (PT): Antoine Primas, MD   Encounter Date: 05/11/2019  PT End of Session - 05/11/19 0929    Visit Number  13    Date for PT Re-Evaluation  06/08/19    Authorization Type  [employer]    Authorization - Visit Number  13    Authorization - Number of Visits  60    PT Start Time  0849    PT Stop Time  0929    PT Time Calculation (min)  40 min    Activity Tolerance  Patient tolerated treatment well    Behavior During Therapy  [facility] for tasks assessed/performed       Past Medical History:  Diagnosis Date  . Abnormal Pap smear of cervix 2014  . Allergy   . Arthritis   . Depression   . History of chicken pox     Past Surgical History:  Procedure Laterality Date  . CRYOTHERAPY  2014   for abnormal pap smear    There were no vitals filed for this visit.  Subjective Assessment - 05/11/19 0855    Subjective  I had 5-6 days of being flared up last week.  I'm frustrated.    Pertinent History  fall at work 2018.  Rt hip arthroscopy (01/06/2019)-still current in PT at [facility]    Currently in Pain?  Yes    Pain Score  4     Pain Location  Scapula    Pain Orientation  Right    Pain Descriptors / Indicators  Aching    Pain Type  Chronic pain    Pain Onset  More than a month ago    Pain Frequency  Intermittent    Aggravating Factors   I'm not sure, use of Rt UE    Pain Relieving Factors  DN, ice, stretching                       [facility] Adult PT Treatment/Exercise - 05/11/19 0001      Lumbar Exercises: Aerobic   UBE (Upper Arm Bike)  L3 3x3 with PT present to discuss status      Shoulder Exercises: Prone   External Rotation  Strengthening;Right;20 reps      Shoulder Exercises:  ROM/Strengthening   Other ROM/Strengthening Exercises  Standing body Blade ossilations 40 sec 2x vertical hold & horizontal     Other ROM/Strengthening Exercises  Body blade with overhead flexion ( full range) 10x       Manual Therapy   Manual Therapy  Myofascial release;Soft tissue mobilization    Manual therapy comments  Rt posterior deltoid, thoracic parapsinals, subscapularis and rhomboids- paplation and assessment during dry needling       Trigger Point Dry Needling - 05/11/19 0001    Consent Given?  Yes    Muscles Treated Upper Quadrant  Subscapularis;Rhomboids;Deltoid   Rt only   Dry Needling Comments  thoracic multifidi    Rhomboids Response  Twitch response elicited;Palpable increased muscle length    Subscapularis Response  Twitch response elicited;Palpable increased muscle length    Deltoid Response  Twitch response elicited;Palpable increased muscle length             PT Short Term Goals - 03/28/19 3299  PT SHORT TERM GOAL #1   Title  be indpendent in initial HEP    Status  Achieved      PT SHORT TERM GOAL #2   Title  report a 30% reduction in Rt shoulder pain and scapular tingling with use of the Rt UE    Baseline  50% less frequent    Status  Achieved      PT SHORT TERM GOAL #3   Title  demonstrate use of the Rt UE without scapular elevation    Baseline  still elevating with functional use    Time  4    Period  Weeks    Status  On-going        PT Long Term Goals - 05/11/19 0857      PT LONG TERM GOAL #1   Title  be independent in advanced HEP    Time  6    Period  Weeks    Status  On-going      PT LONG TERM GOAL #2   Title  reduce FOTO to < or = to 22% limitation    Baseline  23% limitation    Time  6    Period  Weeks    Status  On-going      PT LONG TERM GOAL #4   Title  demonstrate 5/5 Rt shoulder external rotation and extension to improve postural endurance to reduce anterior Rt shoulder pain    Baseline  4+/5    Time  6     Period  Weeks    Status  On-going            Plan - 05/11/19 0901    Clinical Impression Statement  Pt had a 6 day flare-up of pain that began prior to last session.  Pt is frustrated regarding lack of pattern to her pain. Pt continues to demonstrate scapular weakness and fatigues easily with more advanced exercises like body blade overhead.  Pt has been working to improve external rotation in prone to improve functional strength with reaching back with ER.    Pt with improved tension and trigger points in the Rt scapular region although improved from evaluation.  Pt demonstrated improved tissue mobility and reduced trigger points after dry needling today. Pt will continue to benefit from dry needling and mobilization to normalize this tissue while working on scapular strength progression.    PT Frequency  2x / week    PT Duration  6 weeks    PT Treatment/Interventions  ADLs/Self Care Home Management;Electrical Stimulation;Cryotherapy;Moist Heat;Ultrasound;Neuromuscular re-education;Therapeutic activities;Therapeutic exercise;Patient/family education;Manual techniques;Joint Manipulations;Spinal Manipulations;Passive range of motion;Dry needling    PT Next Visit Plan  dry needling, maual, flexibility and advance scapular strength    PT Home Exercise Plan  Access Code: [email or web address]    Consulted and Agree with Plan of Care  Patient       Patient will benefit from skilled therapeutic intervention in order to improve the following deficits and impairments:  Decreased activity tolerance, Decreased strength, Increased muscle spasms, Postural dysfunction, Pain  Visit Diagnosis: Pain in thoracic spine  Muscle weakness (generalized)  Chronic right shoulder pain  Abnormal posture     Problem List Patient Active Problem List   Diagnosis Date Noted  . Large mass of breast 05/02/2019  . 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 Elevated blood pressure reading 12/13/2018  . Preop examination  12/13/2018  . Trigger point of right shoulder region 10/06/2018  . Right shoulder pain 06/15/2018  .  Pain of left calf 12/30/2017  . Labral tear of hip joint 12/09/2017  . Nonallopathic lesion of thoracic region 07/01/2017  . Nonallopathic lesion of sacral region 07/01/2017  . Nonallopathic lesion of lumbosacral region 07/01/2017  . Acute bursitis of right shoulder 06/18/2017  . Scapular dyskinesis 06/18/2017  . Right hip pain 03/23/2017  . Constipation 04/08/2016  . Cervical intraepithelial neoplasia grade 2 02/10/2013  . Knee pain, right anterior 01/06/2013     Sigurd Sos, PT 05/11/19 9:31 AM  [facility] [address] [place], [place], [address] Phone: [phone]   Fax:  [phone]  Name: Arelis Neumeier MRN: [id number] Date of Birth: [birth year]

## 2019-05-11 NOTE — Addendum Note (Signed)
Addended by: Douglass Rivers T on: 05/11/2019 08:55 AM   Modules accepted: Orders

## 2019-05-13 ENCOUNTER — Ambulatory Visit: Payer: 59 | Admitting: Psychology

## 2019-05-16 ENCOUNTER — Other Ambulatory Visit: Payer: Self-pay

## 2019-05-16 NOTE — Progress Notes (Signed)
33 y.o. G0P0000 Married White or Caucasian Not Hispanic or Latino female here for annual exam.  Deep dyspareunia has resolved. Using condoms, planning pregnancy later this year.   H/O chronic groin and right hip pain. Also with a h/o deep dyspareunia and pelvic floor tenderness. She had been seen by Ruben Gottron, PT and Orthopedics. Initially negative imaging.  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 Had hip surgery in 5/20, and is much improved.  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   Period Cycle (Days): 28 Period Duration (Days): 5 days Period Pattern: Regular Menstrual Flow: Moderate Menstrual Control: Tampon Menstrual Control Change Freq (Hours): changes tampon every 5 hours Dysmenorrhea: None  Patient's last menstrual period was 05/03/2019 (exact date).          Sexually active: Yes.    The current method of family planning is condoms most of the time.    Exercising: Yes.    swimming, walking, weights Smoker:  no  Health Maintenance: Pap:  08/21/17 at [facility], WNL, negative HPV. 11/13/2016 WNL NEG HPV, 07-18-15 neg HPV HR neg, 2015 neg, cryo 2014 Abnormal pap: Yes MMG:  none Colonoscopy:  none BMD:   none TDaP:  2010, due  Gardasil: Never   reports that she has never smoked. She has never used smokeless tobacco. She reports current alcohol use of about 2.0 standard drinks of alcohol per week. She reports that she does not use drugs. Teaches dance.   Past Medical History:  Diagnosis Date  . Abnormal Pap smear of cervix 2014  . Allergy   . Arthritis   . Depression   . History of chicken pox     Past Surgical History:  Procedure Laterality Date  . CRYOTHERAPY  2014   for abnormal pap smear  . HIP SURGERY     right hip    Current Outpatient Medications  Medication Sig Dispense Refill  . gabapentin (NEURONTIN) 300 MG capsule 1 PO q HS, may increase to 1 PO BID if needed 60 capsule 6  . MAGNESIUM PO Take by mouth.    . Multiple  Vitamins-Minerals (WOMENS MULTIVITAMIN PLUS) TABS Take 1 tablet by mouth daily.    Marland Kitchen VITAMIN D PO Take by mouth.     No current facility-administered medications for this visit.     Family History  Problem Relation Age of Onset  . Mitral valve prolapse Father   . Hypertension Mother   . Colon polyps Mother   . Lymphoma Mother   . Breast cancer Maternal Aunt 40       deceased from breast cancer  . Lung cancer Paternal Grandfather   . Mitral valve prolapse Paternal Aunt   . Lung cancer Maternal Grandfather   . Mitral valve prolapse Paternal Aunt   . Colon cancer Neg Hx   MAunt was 56 when she died of breast cancer. Other maternal aunt with lymphoma.  Mom has 2 sisters and a brother.   Review of Systems  Constitutional: Negative.   HENT: Negative.   Eyes: Negative.   Respiratory: Negative.   Cardiovascular: Negative.   Gastrointestinal: Negative.   Endocrine: Negative.   Genitourinary: Negative.   Musculoskeletal: Negative.   Skin: Negative.   Allergic/Immunologic: Negative.   Neurological: Negative.   Hematological: Negative.   Psychiatric/Behavioral: Negative.     Exam:   BP 126/84 (BP Location: Right Arm, Patient Position: Sitting, Cuff Size: Normal)   Pulse 88   Temp (!) 97.3  F (36.3 C) (Temporal)   Ht 5' 10.75" (1.797 m)   Wt 201 lb 12.8 oz (91.5 kg)   LMP 05/03/2019 (Exact Date)   BMI 28.34 kg/m   Weight change: @WEIGHTCHANGE @ Height:   Height: 5' 10.75" (179.7 cm)  Ht Readings from Last 3 Encounters:  05/17/19 5' 10.75" (1.797 m)  05/02/19 5\' 10"  (1.778 m)  03/17/19 5\' 10"  (1.778 m)    General appearance: alert, cooperative and appears stated age Head: Normocephalic, without obvious abnormality, atraumatic Neck: no adenopathy, supple, symmetrical, trachea midline and thyroid normal to inspection and palpation Lungs: clear to auscultation bilaterally Cardiovascular: regular rate and rhythm Breasts: normal appearance, no masses or tenderness, left breast  larger than right Abdomen: soft, non-tender; non distended,  no masses,  no organomegaly Extremities: extremities normal, atraumatic, no cyanosis or edema Skin: Skin color, texture, turgor normal. No rashes or lesions Lymph nodes: Cervical, supraclavicular, and axillary nodes normal. No abnormal inguinal nodes palpated Neurologic: Grossly normal   Pelvic: External genitalia:  no lesions              Urethra:  normal appearing urethra with no masses, tenderness or lesions              Bartholins and Skenes: normal                 Vagina: normal appearing vagina with normal color and discharge, no lesions              Cervix: no lesions               Bimanual Exam:  Uterus:  normal size, contour, position, consistency, mobility, non-tender              Adnexa: no mass, fullness, tenderness               Rectovaginal: Confirms               Anus:  normal sphincter tone, no lesions  Pelvic floor: not tender  Chaperone was present for exam.  A:  Well Woman with normal exam  Planning pregnancy  Prior hip and pelvic pain resolving after repair of labral tear of her hip joint  P:   Weaning off gabapentin  No pap this year  Discussed breast self exam  Discussed calcium and vit D intake  Screening labs, rubella (she has had chicken pox)  Flu shot today  TDAP today  Start PNV  Discussed good health prior to pregnancy, information given. Also given information on AMA, medications that can be used in pregnancy and answers to common questions in pregnancy  Names of OBs given

## 2019-05-17 ENCOUNTER — Other Ambulatory Visit: Payer: Self-pay

## 2019-05-17 ENCOUNTER — Ambulatory Visit: Payer: Managed Care, Other (non HMO) | Admitting: Obstetrics and Gynecology

## 2019-05-17 ENCOUNTER — Encounter: Payer: Self-pay | Admitting: Obstetrics and Gynecology

## 2019-05-17 VITALS — BP 126/84 | HR 88 | Temp 97.3°F | Ht 70.75 in | Wt 201.8 lb

## 2019-05-17 DIAGNOSIS — Z3169 Encounter for other general counseling and advice on procreation: Secondary | ICD-10-CM | POA: Diagnosis not present

## 2019-05-17 DIAGNOSIS — Z0000 Encounter for general adult medical examination without abnormal findings: Secondary | ICD-10-CM

## 2019-05-17 DIAGNOSIS — Z01419 Encounter for gynecological examination (general) (routine) without abnormal findings: Secondary | ICD-10-CM | POA: Diagnosis not present

## 2019-05-17 DIAGNOSIS — Z23 Encounter for immunization: Secondary | ICD-10-CM | POA: Diagnosis not present

## 2019-05-17 NOTE — Patient Instructions (Signed)
EXERCISE AND DIET:  We recommended that you start or continue a regular exercise program for good health. Regular exercise means any activity that makes your heart beat faster and makes you sweat.  We recommend exercising at least 30 minutes per day at least 3 days a week, preferably 4 or 5.  We also recommend a diet low in fat and sugar.  Inactivity, poor dietary choices and obesity can cause diabetes, heart attack, stroke, and kidney damage, among others.    ALCOHOL AND SMOKING:  Women should limit their alcohol intake to no more than 7 drinks/beers/glasses of wine (combined, not each!) per week. Moderation of alcohol intake to this level decreases your risk of breast cancer and liver damage. And of course, no recreational drugs are part of a healthy lifestyle.  And absolutely no smoking or even second hand smoke. Most people know smoking can cause heart and lung diseases, but did you know it also contributes to weakening of your bones? Aging of your skin?  Yellowing of your teeth and nails?  CALCIUM AND VITAMIN D:  Adequate intake of calcium and Vitamin D are recommended.  The recommendations for exact amounts of these supplements seem to change often, but generally speaking 1,000 mg of calcium (between diet and supplement) and 800 units of Vitamin D per day seems prudent. Certain women may benefit from higher intake of Vitamin D.  If you are among these women, your doctor will have told you during your visit.    PAP SMEARS:  Pap smears, to check for cervical cancer or precancers,  have traditionally been done yearly, although recent scientific advances have shown that most women can have pap smears less often.  However, every woman still should have a physical exam from her gynecologist every year. It will include a breast check, inspection of the vulva and vagina to check for abnormal growths or skin changes, a visual exam of the cervix, and then an exam to evaluate the size and shape of the uterus and  ovaries.  And after 34 years of age, a rectal exam is indicated to check for rectal cancers. We will also provide age appropriate advice regarding health maintenance, like when you should have certain vaccines, screening for sexually transmitted diseases, bone density testing, colonoscopy, mammograms, etc.   MAMMOGRAMS:  All women over 75 years old should have a yearly mammogram. Many facilities now offer a "3D" mammogram, which may cost around $50 extra out of pocket. If possible,  we recommend you accept the option to have the 3D mammogram performed.  It both reduces the number of women who will be called back for extra views which then turn out to be normal, and it is better than the routine mammogram at detecting truly abnormal areas.    COLON CANCER SCREENING: Now recommend starting at age 57. At this time colonoscopy is not covered for routine screening until 50. There are take home tests that can be done between 45-49.   COLONOSCOPY:  Colonoscopy to screen for colon cancer is recommended for all women at age 91.  We know, you hate the idea of the prep.  We agree, BUT, having colon cancer and not knowing it is worse!!  Colon cancer so often starts as a polyp that can be seen and removed at colonscopy, which can quite literally save your life!  And if your first colonoscopy is normal and you have no family history of colon cancer, most women don't have to have it again for  10 years.  Once every ten years, you can do something that may end up saving your life, right?  We will be happy to help you get it scheduled when you are ready.  Be sure to check your insurance coverage so you understand how much it will cost.  It may be covered as a preventative service at no cost, but you should check your particular policy.   ° ° ° °Breast Self-Awareness °Breast self-awareness means being familiar with how your breasts look and feel. It involves checking your breasts regularly and reporting any changes to your  health care provider. °Practicing breast self-awareness is important. A change in your breasts can be a sign of a serious medical problem. Being familiar with how your breasts look and feel allows you to find any problems early, when treatment is more likely to be successful. All women should practice breast self-awareness, including women who have had breast implants. °How to do a breast self-exam °One way to learn what is normal for your breasts and whether your breasts are changing is to do a breast self-exam. To do a breast self-exam: °Look for Changes ° °1. Remove all the clothing above your waist. °2. Stand in front of a mirror in a room with good lighting. °3. Put your hands on your hips. °4. Push your hands firmly downward. °5. Compare your breasts in the mirror. Look for differences between them (asymmetry), such as: °? Differences in shape. °? Differences in size. °? Puckers, dips, and bumps in one breast and not the other. °6. Look at each breast for changes in your skin, such as: °? Redness. °? Scaly areas. °7. Look for changes in your nipples, such as: °? Discharge. °? Bleeding. °? Dimpling. °? Redness. °? A change in position. °Feel for Changes °Carefully feel your breasts for lumps and changes. It is best to do this while lying on your back on the floor and again while sitting or standing in the shower or tub with soapy water on your skin. Feel each breast in the following way: °· Place the arm on the side of the breast you are examining above your head. °· Feel your breast with the other hand. °· Start in the nipple area and make ¾ inch (2 cm) overlapping circles to feel your breast. Use the pads of your three middle fingers to do this. Apply light pressure, then medium pressure, then firm pressure. The light pressure will allow you to feel the tissue closest to the skin. The medium pressure will allow you to feel the tissue that is a little deeper. The firm pressure will allow you to feel the tissue  close to the ribs. °· Continue the overlapping circles, moving downward over the breast until you feel your ribs below your breast. °· Move one finger-width toward the center of the body. Continue to use the ¾ inch (2 cm) overlapping circles to feel your breast as you move slowly up toward your collarbone. °· Continue the up and down exam using all three pressures until you reach your armpit. ° °Write Down What You Find ° °Write down what is normal for each breast and any changes that you find. Keep a written record with breast changes or normal findings for each breast. By writing this information down, you do not need to depend only on memory for size, tenderness, or location. Write down where you are in your menstrual cycle, if you are still menstruating. °If you are having trouble noticing differences   in your breasts, do not get discouraged. With time you will become more familiar with the variations in your breasts and more comfortable with the exam. How often should I examine my breasts? Examine your breasts every month. If you are breastfeeding, the best time to examine your breasts is after a feeding or after using a breast pump. If you menstruate, the best time to examine your breasts is 5-7 days after your period is over. During your period, your breasts are lumpier, and it may be more difficult to notice changes. When should I see my health care provider? See your health care provider if you notice:  A change in shape or size of your breasts or nipples.  A change in the skin of your breast or nipples, such as a reddened or scaly area.  Unusual discharge from your nipples.  A lump or thick area that was not there before.  Pain in your breasts.  Anything that concerns you. Common Medications Safe in Pregnancy  Acne:      Constipation:  Benzoyl Peroxide     Colace  Clindamycin      Dulcolax Suppository  Topica Erythromycin     Fibercon  Salicylic  Acid      Metamucil         Miralax AVOID:        Senakot   Accutane    Cough:  Retin-A       Cough Drops  Tetracycline      Phenergan w/ Codeine if Rx  Minocycline      Robitussin (Plain & DM)  Antibiotics:     Crabs/Lice:  Ceclor       RID  Cephalosporins    AVOID:  E-Mycins      Kwell  Keflex  Macrobid/Macrodantin   Diarrhea:  Penicillin      Kao-Pectate  Zithromax      Imodium AD         PUSH FLUIDS AVOID:       Cipro     Fever:  Tetracycline      Tylenol (Regular or Extra  Minocycline       Strength)  Levaquin      Extra Strength-Do not          Exceed 8 tabs/24 hrs Caffeine:        '200mg'$ /day (equiv. To 1 cup of coffee or  approx. 3 12 oz sodas)         Gas: Cold/Hayfever:       Gas-X  Benadryl      Mylicon  Claritin       Phazyme  **Claritin-D        Chlor-Trimeton    Headaches:  Dimetapp      ASA-Free Excedrin  Drixoral-Non-Drowsy     Cold Compress  Mucinex (Guaifenasin)     Tylenol (Regular or Extra  Sudafed/Sudafed-12 Hour     Strength)  **Sudafed PE Pseudoephedrine   Tylenol Cold & Sinus     Vicks Vapor Rub  Zyrtec  **AVOID if Problems With Blood Pressure         Heartburn: Avoid lying down for at least 1 hour after meals  Aciphex      Maalox     Rash:  Milk of Magnesia     Benadryl    Mylanta       1% Hydrocortisone Cream  Pepcid  Pepcid Complete   Sleep Aids:  Prevacid      Ambien   Prilosec  Benadryl  Rolaids       Chamomile Tea  Tums (Limit 4/day)     Unisom  Zantac       Tylenol PM         Warm milk-add vanilla or  Hemorrhoids:       Sugar for taste  Anusol/Anusol H.C.  (RX: Analapram 2.5%)  Sugar Substitutes:  Hydrocortisone OTC     Ok in moderation  Preparation H      Tucks        Vaseline lotion applied to tissue with wiping    Herpes:     Throat:  Acyclovir      Oragel  Famvir  Valtrex     Vaccines:         Flu Shot Leg Cramps:       *Gardasil  Benadryl      Hepatitis A         Hepatitis B Nasal  Spray:       Pneumovax  Saline Nasal Spray     Polio Booster         Tetanus Nausea:       Tuberculosis test or PPD  Vitamin B6 25 mg TID   AVOID:    Dramamine      *Gardasil  Emetrol       Live Poliovirus  Ginger Root 250 mg QID    MMR (measles, mumps &  High Complex Carbs @ Bedtime    rebella)  Sea Bands-Accupressure    Varicella (Chickenpox)  Unisom 1/2 tab TID     *No known complications           If received before Pain:         Known pregnancy;   Darvocet       Resume series after  Lortab        Delivery  Percocet    Yeast:   Tramadol      Femstat  Tylenol 3      Gyne-lotrimin  Ultram       Monistat  Vicodin           MISC:         All Sunscreens           Hair Coloring/highlights          Insect Repellant's          (Including DEET)         Mystic Tans  Pregnancy After Age 80 Women who become pregnant after the age of 34 have a higher risk for certain problems during pregnancy. This is because older women may already have health problems before becoming pregnant. Older women who are healthy before pregnancy may still develop problems during pregnancy. These problems may affect the mother, the unborn baby (fetus), or both. What are the risks for me? If you are over age 20 and you want to become pregnant or are pregnant, you may have a higher risk of:  Not being able to get pregnant (infertility).  Going into labor early (preterm labor).  Needing surgical delivery of your baby (cesarean delivery, or C-section).  Having high blood pressure (hypertension).  Having complications during pregnancy, such as high blood pressure and other symptoms (preeclampsia).  Having diabetes during pregnancy (gestational diabetes).  Being pregnant with more than one baby.  Loss of the unborn baby before 20 weeks (miscarriage) or after 20 weeks of pregnancy (stillbirth). What are the risks for my baby? Babies born to women over the age of  35 have a higher risk for:  Being born early  (prematurity).  Low birth weight, which is less than 5 lb, 8 oz (2.5 kg).  Birth defects, such as Down syndrome and cleft palate.  Health complications, including problems with growth and development. How is prenatal care different for women over age 18? All women should see their health care provider before they try to become pregnant. This is especially important for women over the age of 27. Tell your health care provider about:  Any health problems you have.  Any medicines you take.  Any family history of health problems or chromosome-related defects.  Any problems you have had with past pregnancies or deliveries. If you are over age 2 and you plan to become pregnant:  Start taking a daily multivitamin a month or more before you try to get pregnant. Your multivitamin should contain 400 mcg (micrograms) of folic acid. If you are over age 57 and pregnant, make sure you:  Keep taking your multivitamin unless your health care provider tells you not to take it.  Keep all prenatal visits as told by your health care provider. This is important.  Have ultrasounds regularly throughout your pregnancy to check for problems.  Talk with your health care provider about other prenatal screening tests that you may need. What additional prenatal tests are needed? Screening tests show whether your baby has a higher risk for birth defects than other babies. Screening tests include:  Ultrasound tests to look for markers that indicate a risk for birth defects.  Maternal blood screening. These are blood tests that measure certain substances in your blood to determine your baby's risk for defects. Screening tests do not show whether your baby has or does not have defects. They only show your baby's risk for certain defects. If your screening tests show that risk factors are present, you may need tests to confirm the defect (diagnostic testing). These tests may include:  Chorionic villus sampling.  For this procedure, a tissue sample is taken from the organ that forms in your uterus to nourish your baby (placenta). The sample is removed through your cervix or abdomen and tested.  Amniocentesis. For this procedure, a small amount of the fluid that surrounds the baby in the uterus (amniotic fluid) is removed and tested. What can I do to stay healthy during my pregnancy? Staying healthy during pregnancy can help you and your baby to have a lower risk for problems during pregnancy, during delivery, or both. Talk with your health care provider for specific instructions about staying healthy during your pregnancy. Nutrition   At each meal, eat a variety of foods from each of the five food groups. These groups include: ? Proteins such as lean meats, poultry, fish that is low in fat, beans, eggs, and nuts. ? Vegetables such as leafy greens, raw and cooked vegetables, and vegetable juice. ? Fruits that are fresh, frozen, or canned, or 100% fruit juice. ? Dairy products such as low-fat yogurt, cheese, and milk. ? Whole grains including rice, cereal, pasta, and bread.  Talk with your health care provider about how much food in each group is right for you.  Follow instructions from your health care provider about eating and drinking restrictions during pregnancy. ? Do not eat raw eggs, raw meat, or raw fish or seafood. ? Do not eat any fish that contains high amounts of mercury, such as swordfish or mackerel.  Drink 6-8 or more glasses of water a day. You should drink  enough fluid to keep your urine pale yellow. Managing weight gain  Ask your health care provider how much weight gain is healthy during pregnancy.  Stay at a healthy weight. If needed, work with your health care provider to lose weight safely. Activity  Exercise regularly, as directed by your health care provider. Ask your health care provider what forms of exercise are safe for you. General instructions  Do not use any  products that contain nicotine or tobacco, such as cigarettes and e-cigarettes. If you need help quitting, ask your health care provider.  Do not drink alcohol, use drugs, or abuse prescription medicine.  Take over-the-counter and prescription medicines only as told by your health care provider.  Do not use hot tubs, steam rooms, or saunas.  Talk with your health care provider about your risk of exposure to harmful environmental conditions. This includes exposure to chemicals, radiation, cleaning products, and cat feces. Follow advice from your health care provider about how to limit your exposure. Summary  Women who become pregnant after the age of 19 have a higher risk for complications during pregnancy.  Problems may affect the mother, the unborn baby (fetus), or both.  All women should see their health care provider before they try to become pregnant. This is especially important for women over the age of 26.  Staying healthy during pregnancy can help both you and your baby to have a lower risk for some of the problems that can happen during pregnancy, during delivery, or both. This information is not intended to replace advice given to you by your health care provider. Make sure you discuss any questions you have with your health care provider. Document Released: 11/17/2016 Document Revised: 11/19/2018 Document Reviewed: 11/17/2016 Elsevier Patient Education  2020 [employer]. Commonly Asked Questions During Pregnancy  Cats: A parasite can be excreted in cat feces.  To avoid exposure you need to have another person empty the little box.  If you must empty the litter box you will need to wear gloves.  Wash your hands after handling your cat.  This parasite can also be found in raw or undercooked meat so this should also be avoided.  Colds, Sore Throats, Flu: Please check your medication sheet to see what you can take for symptoms.  If your symptoms are unrelieved by these medications  please call the office.  Dental Work: Most any dental work Investment banker, corporate recommends is permitted.  X-rays should only be taken during the first trimester if absolutely necessary.  Your abdomen should be shielded with a lead apron during all x-rays.  Please notify your provider prior to receiving any x-rays.  Novocaine is fine; gas is not recommended.  If your dentist requires a note from [place] prior to dental work please call the office and we will provide one for you.  Exercise: Exercise is an important part of staying healthy during your pregnancy.  You may continue most exercises you were accustomed to prior to pregnancy.  Later in your pregnancy you will most likely notice you have difficulty with activities requiring balance like riding a bicycle.  It is important that you listen to your body and avoid activities that put you at a higher risk of falling.  Adequate rest and staying well hydrated are a must!  If you have questions about the safety of specific activities ask your provider.    Exposure to Children with illness: Try to avoid obvious exposure; report any symptoms to [place] when noted,  If  you have chicken pos, red measles or mumps, you should be immune to these diseases.   Please do not take any vaccines while pregnant unless you have checked with your OB provider.  Fetal Movement: After 28 weeks we recommend you do "kick counts" twice daily.  Lie or sit down in a calm quiet environment and count your baby movements "kicks".  You should feel your baby at least 10 times per hour.  If you have not felt 10 kicks within the first hour get up, walk around and have something sweet to eat or drink then repeat for an additional hour.  If count remains less than 10 per hour notify your provider.  Fumigating: Follow your pest control agent's advice as to how long to stay out of your home.  Ventilate the area well before re-entering.  Hemorrhoids:   Most over-the-counter preparations can be used during  pregnancy.  Check your medication to see what is safe to use.  It is important to use a stool softener or fiber in your diet and to drink lots of liquids.  If hemorrhoids seem to be getting worse please call the office.   Hot Tubs:  Hot tubs Jacuzzis and saunas are not recommended while pregnant.  These increase your internal body temperature and should be avoided.  Intercourse:  Sexual intercourse is safe during pregnancy as long as you are comfortable, unless otherwise advised by your provider.  Spotting may occur after intercourse; report any bright red bleeding that is heavier than spotting.  Labor:  If you know that you are in labor, please go to the hospital.  If you are unsure, please call the office and let us help you decide what to do.  Lifting, straining, etc:  If your job requires heavy lifting or straining please check with your provider for any limitations.  Generally, you should not lift items heavier than that you can lift simply with your hands and arms (no back muscles)  Painting:  Paint fumes do not harm your pregnancy, but may make you ill and should be avoided if possible.  Latex or water based paints have less odor than oils.  Use adequate ventilation while painting.  Permanents & Hair Color:  Chemicals in hair dyes are not recommended as they cause increase hair dryness which can increase hair loss during pregnancy.  " Highlighting" and permanents are allowed.  Dye may be absorbed differently and permanents may not hold as well during pregnancy.  Sunbathing:  Use a sunscreen, as skin burns easily during pregnancy.  Drink plenty of fluids; avoid over heating.  Tanning Beds:  Because their possible side effects are still unknown, tanning beds are not recommended.  Ultrasound Scans:  Routine ultrasounds are performed at approximately 20 weeks.  You will be able to see your baby's general anatomy an if you would like to know the gender this can usually be determined as well.  If  it is questionable when you conceived you may also receive an ultrasound early in your pregnancy for dating purposes.  Otherwise ultrasound exams are not routinely performed unless there is a medical necessity.  Although you can request a scan we ask that you pay for it when conducted because insurance does not cover " patient request" scans.  Work: If your pregnancy proceeds without complications you may work until your due date, unless your physician or employer advises otherwise.  Round Ligament Pain/Pelvic Discomfort:  Sharp, shooting pains not associated with bleeding are fairly common, usually  occurring in the second trimester of pregnancy.  They tend to be worse when standing up or when you remain standing for long periods of time.  These are the result of pressure of certain pelvic ligaments called "round ligaments".  Rest, Tylenol and heat seem to be the most effective relief.  As the womb and fetus grow, they rise out of the pelvis and the discomfort improves.  Please notify the office if your pain seems different than that described.  It may represent a more serious condition.   Preparing for Pregnancy If you are considering becoming pregnant, make an appointment to see your regular health care provider to learn how to prepare for a safe and healthy pregnancy (preconception care). During a preconception care visit, your health care provider will:  Do a complete physical exam, including a Pap test.  Take a complete medical history.  Give you information, answer your questions, and help you resolve problems. Preconception checklist Medical history  Tell your health care provider about any current or past medical conditions. Your pregnancy or your ability to become pregnant may be affected by chronic conditions, such as diabetes, chronic hypertension, and thyroid problems.  Include your family's medical history as well as your partner's medical history.  Tell your health care provider  about any history of STIs (sexually transmitted infections).These can affect your pregnancy. In some cases, they can be passed to your baby. Discuss any concerns that you have about STIs.  If indicated, discuss the benefits of genetic testing. This testing will show whether there are any genetic conditions that may be passed from you or your partner to your baby.  Tell your health care provider about: ? Any problems you have had with conception or pregnancy. ? Any medicines you take. These include vitamins, herbal supplements, and over-the-counter medicines. ? Your history of immunizations. Discuss any vaccinations that you may need. Diet  Ask your health care provider what to include in a healthy diet that has a balance of nutrients. This is especially important when you are pregnant or preparing to become pregnant.  Ask your health care provider to help you reach a healthy weight before pregnancy. ? If you are overweight, you may be at higher risk for certain complications, such as high blood pressure, diabetes, and preterm birth. ? If you are underweight, you are more likely to have a baby who has a low birth weight. Lifestyle, work, and home  Let your health care provider know: ? About any lifestyle habits that you have, such as alcohol use, drug use, or smoking. ? About recreational activities that may put you at risk during pregnancy, such as downhill skiing and certain exercise programs. ? Tell your health care provider about any international travel, especially any travel to places with an active [place] virus outbreak. ? About harmful substances that you may be exposed to at work or at home. These include chemicals, pesticides, radiation, or even litter boxes. ? If you do not feel safe at home. Mental health  Tell your health care provider about: ? Any history of mental health conditions, including feelings of depression, sadness, or anxiety. ? Any medicines that you take for a  mental health condition. These include herbs and supplements. Home instructions to prepare for pregnancy Lifestyle   Eat a balanced diet. This includes fresh fruits and vegetables, whole grains, lean meats, low-fat dairy products, healthy fats, and foods that are high in fiber. Ask to meet with a nutritionist or registered dietitian for  assistance with meal planning and goals.  Get regular exercise. Try to be active for at least 30 minutes a day on most days of the week. Ask your health care provider which activities are safe during pregnancy.  Do not use any products that contain nicotine or tobacco, such as cigarettes and e-cigarettes. If you need help quitting, ask your health care provider.  Do not drink alcohol.  Do not take illegal drugs.  Maintain a healthy weight. Ask your health care provider what weight range is right for you. General instructions  Keep an accurate record of your menstrual periods. This makes it easier for your health care provider to determine your baby's due date.  Begin taking prenatal vitamins and folic acid supplements daily as directed by your health care provider.  Manage any chronic conditions, such as high blood pressure and diabetes, as told by your health care provider. This is important. How do I know that I am pregnant? You may be pregnant if you have been sexually active and you miss your period. Symptoms of early pregnancy include:  Mild cramping.  Very light vaginal bleeding (spotting).  Feeling unusually tired.  Nausea and vomiting (morning sickness). If you have any of these symptoms and you suspect that you might be pregnant, you can take a home pregnancy test. These tests check for a hormone in your urine (human chorionic gonadotropin, or hCG). A woman's body begins to make this hormone during early pregnancy. These tests are very accurate. Wait until at least the first day after you miss your period to take one. If the test shows that  you are pregnant (you get a positive result), call your health care provider to make an appointment for prenatal care. What should I do if I become pregnant?      Make an appointment with your health care provider as soon as you suspect you are pregnant.  Do not use any products that contain nicotine, such as cigarettes, chewing tobacco, and e-cigarettes. If you need help quitting, ask your health care provider.  Do not drink alcoholic beverages. Alcohol is related to a number of birth defects.  Avoid toxic odors and chemicals.  You may continue to have sexual intercourse if it does not cause pain or other problems, such as vaginal bleeding. This information is not intended to replace advice given to you by your health care provider. Make sure you discuss any questions you have with your health care provider. Document Released: 07/10/2008 Document Revised: 07/30/2017 Document Reviewed: 02/17/2016 Elsevier Patient Education  2020 [employer].

## 2019-05-18 ENCOUNTER — Ambulatory Visit: Payer: Managed Care, Other (non HMO) | Attending: Family Medicine

## 2019-05-18 DIAGNOSIS — M25511 Pain in right shoulder: Secondary | ICD-10-CM | POA: Insufficient documentation

## 2019-05-18 DIAGNOSIS — G8929 Other chronic pain: Secondary | ICD-10-CM | POA: Diagnosis present

## 2019-05-18 DIAGNOSIS — M546 Pain in thoracic spine: Secondary | ICD-10-CM | POA: Insufficient documentation

## 2019-05-18 DIAGNOSIS — R293 Abnormal posture: Secondary | ICD-10-CM | POA: Diagnosis present

## 2019-05-18 DIAGNOSIS — M6281 Muscle weakness (generalized): Secondary | ICD-10-CM | POA: Diagnosis present

## 2019-05-18 LAB — COMPREHENSIVE METABOLIC PANEL
ALT: 7 IU/L (ref 0–32)
AST: 15 IU/L (ref 0–40)
Albumin/Globulin Ratio: 1.7 (ref 1.2–2.2)
Albumin: 4.7 g/dL (ref 3.8–4.8)
Alkaline Phosphatase: 81 IU/L (ref 39–117)
BUN/Creatinine Ratio: 18 (ref 9–23)
BUN: 15 mg/dL (ref 6–20)
Bilirubin Total: 0.3 mg/dL (ref 0.0–1.2)
CO2: 24 mmol/L (ref 20–29)
Calcium: 9.4 mg/dL (ref 8.7–10.2)
Chloride: 101 mmol/L (ref 96–106)
Creatinine, Ser: 0.85 mg/dL (ref 0.57–1.00)
GFR calc Af Amer: 103 mL/min/{1.73_m2} (ref >59)
GFR calc non Af Amer: 90 mL/min/{1.73_m2} (ref >59)
Globulin, Total: 2.7 g/dL (ref 1.5–4.5)
Glucose: 92 mg/dL (ref 65–99)
Potassium: 4.3 mmol/L (ref 3.5–5.2)
Sodium: 139 mmol/L (ref 134–144)
Total Protein: 7.4 g/dL (ref 6.0–8.5)

## 2019-05-18 LAB — RUBELLA SCREEN: Rubella Antibodies, IGG: 3.06 index (ref >0.99)

## 2019-05-18 LAB — CBC
Hematocrit: 39.7 % (ref 34.0–46.6)
Hemoglobin: 13.3 g/dL (ref 11.1–15.9)
MCH: 31.6 pg (ref 26.6–33.0)
MCHC: 33.5 g/dL (ref 31.5–35.7)
MCV: 94 fL (ref 79–97)
Platelets: 296 10*3/uL (ref 150–450)
RBC: 4.21 x10E6/uL (ref 3.77–5.28)
RDW: 12.3 % (ref 11.7–15.4)
WBC: 5.2 10*3/uL (ref 3.4–10.8)

## 2019-05-18 LAB — LIPID PANEL
Chol/HDL Ratio: 2.7 ratio (ref 0.0–4.4)
Cholesterol, Total: 178 mg/dL (ref 100–199)
HDL: 66 mg/dL (ref >39)
LDL Chol Calc (NIH): 99 mg/dL (ref 0–99)
Triglycerides: 71 mg/dL (ref 0–149)
VLDL Cholesterol Cal: 13 mg/dL (ref 5–40)

## 2019-05-18 NOTE — Therapy (Signed)
[facility] [address], [place], [place], [address] Phone: [phone]   Fax:  [phone]  Physical Therapy Treatment  Patient Details  Name: Robyn Long MRN: [id number] Date of Birth: 05-Mar-1986 Referring Provider (PT): Hulan Saas, MD   Encounter Date: 05/18/2019  PT End of Session - 05/18/19 0930    Visit Number  14    Date for PT Re-Evaluation  06/08/19    Authorization Type  [employer]    PT Start Time  0850    PT Stop Time  0932    PT Time Calculation (min)  42 min    Activity Tolerance  Patient tolerated treatment well    Behavior During Therapy  [facility] for tasks assessed/performed       Past Medical History:  Diagnosis Date  . Abnormal Pap smear of cervix 2014  . Allergy   . Arthritis   . Depression   . History of chicken pox     Past Surgical History:  Procedure Laterality Date  . CRYOTHERAPY  2014   for abnormal pap smear  . HIP SURGERY     right hip    There were no vitals filed for this visit.  Subjective Assessment - 05/18/19 0901    Subjective  I am still having constant pain.  I feel better for 2-3 days after needling.    Currently in Pain?  Yes    Pain Score  3     Pain Location  Scapula    Pain Orientation  Right    Pain Descriptors / Indicators  Aching    Pain Type  Chronic pain    Pain Onset  More than a month ago    Pain Frequency  Intermittent    Aggravating Factors   it doesn't seem related to activity    Pain Relieving Factors  DN, ice, stretching                       [facility] Adult PT Treatment/Exercise - 05/18/19 0001      Lumbar Exercises: Aerobic   UBE (Upper Arm Bike)  L3 3x3 with PT present to discuss status      Shoulder Exercises: ROM/Strengthening   Other ROM/Strengthening Exercises  Standing body Blade ossilations 40 sec 2x vertical hold & horizontal     Other ROM/Strengthening Exercises  Body blade with overhead flexion ( full range) 10x        Manual Therapy   Manual Therapy  Myofascial release;Soft tissue mobilization    Manual therapy comments  Rt posterior deltoid, thoracic parapsinals, subscapularis and rhomboids- paplation and assessment during dry needling       Trigger Point Dry Needling - 05/18/19 0001    Consent Given?  Yes    Muscles Treated Upper Quadrant  Subscapularis;Rhomboids;Deltoid   Rt only   Dry Needling Comments  thoracic multifidi    Rhomboids Response  Twitch response elicited;Palpable increased muscle length    Subscapularis Response  Twitch response elicited;Palpable increased muscle length    Deltoid Response  Twitch response elicited;Palpable increased muscle length             PT Short Term Goals - 03/28/19 0942      PT SHORT TERM GOAL #1   Title  be indpendent in initial HEP    Status  Achieved      PT SHORT TERM GOAL #2   Title  report a 30% reduction in Rt shoulder pain and  scapular tingling with use of the Rt UE    Baseline  50% less frequent    Status  Achieved      PT SHORT TERM GOAL #3   Title  demonstrate use of the Rt UE without scapular elevation    Baseline  still elevating with functional use    Time  4    Period  Weeks    Status  On-going        PT Long Term Goals - 05/11/19 0857      PT LONG TERM GOAL #1   Title  be independent in advanced HEP    Time  6    Period  Weeks    Status  On-going      PT LONG TERM GOAL #2   Title  reduce FOTO to < or = to 22% limitation    Baseline  23% limitation    Time  6    Period  Weeks    Status  On-going      PT LONG TERM GOAL #4   Title  demonstrate 5/5 Rt shoulder external rotation and extension to improve postural endurance to reduce anterior Rt shoulder pain    Baseline  4+/5    Time  6    Period  Weeks    Status  On-going            Plan - 05/18/19 0859    Clinical Impression Statement  Flare-up has continued since last session.  Pt does report relief of symptoms after dry needling x 2-3 days.  Pt  is frustrated regarding lack of pattern to her pain. Pt continues to demonstrate scapular weakness and fatigues easily with more advanced exercises like body blade overhead.  Pt reports less fatigue with body blade at the end of 40 seconds.  Pt has continued to do her exercises for strength.    Pt with improved tension and trigger points in the Rt scapular region although improved from evaluation.  Pt demonstrated improved tissue mobility and reduced trigger points after dry needling today. Pt will continue to benefit from dry needling and mobilization to normalize this tissue while working on scapular strength progression.    PT Frequency  2x / week    PT Duration  6 weeks    PT Treatment/Interventions  ADLs/Self Care Home Management;Electrical Stimulation;Cryotherapy;Moist Heat;Ultrasound;Neuromuscular re-education;Therapeutic activities;Therapeutic exercise;Patient/family education;Manual techniques;Joint Manipulations;Spinal Manipulations;Passive range of motion;Dry needling    PT Next Visit Plan  dry needling, maual, flexibility and advance scapular strength    PT Home Exercise Plan  Access Code: [email or web address]    Consulted and Agree with Plan of Care  Patient       Patient will benefit from skilled therapeutic intervention in order to improve the following deficits and impairments:  Decreased activity tolerance, Decreased strength, Increased muscle spasms, Postural dysfunction, Pain  Visit Diagnosis: Pain in thoracic spine  Muscle weakness (generalized)  Chronic right shoulder pain  Abnormal posture     Problem List Patient Active Problem List   Diagnosis Date Noted  . Large mass of breast 05/02/2019  . 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 Elevated blood pressure reading 12/13/2018  . Preop examination 12/13/2018  . Trigger point of right shoulder region 10/06/2018  . Right shoulder pain 06/15/2018  . Pain of left calf 12/30/2017  . Labral tear of hip joint 12/09/2017  .  Nonallopathic lesion of thoracic region 07/01/2017  . Nonallopathic lesion of sacral region 07/01/2017  . Nonallopathic lesion  of lumbosacral region 07/01/2017  . Acute bursitis of right shoulder 06/18/2017  . Scapular dyskinesis 06/18/2017  . Right hip pain 03/23/2017  . Constipation 04/08/2016  . Cervical intraepithelial neoplasia grade 2 02/10/2013  . Knee pain, right anterior 01/06/2013    Lorrene ReidKelly Radiah Lubinski, PT 05/18/19 9:33 AM  [facility] [address] [place], [place], [id number] Phone: [phone]   Fax:  [phone]  Name: Flint MelterMichele Knipple MRN: [id number] Date of Birth: 08/03/1986

## 2019-05-20 ENCOUNTER — Ambulatory Visit (INDEPENDENT_AMBULATORY_CARE_PROVIDER_SITE_OTHER): Payer: 59 | Admitting: Psychology

## 2019-05-20 DIAGNOSIS — F332 Major depressive disorder, recurrent severe without psychotic features: Secondary | ICD-10-CM

## 2019-05-22 ENCOUNTER — Telehealth: Payer: Managed Care, Other (non HMO) | Admitting: Family

## 2019-05-22 DIAGNOSIS — Z20828 Contact with and (suspected) exposure to other viral communicable diseases: Secondary | ICD-10-CM

## 2019-05-22 DIAGNOSIS — Z20822 Contact with and (suspected) exposure to covid-19: Secondary | ICD-10-CM

## 2019-05-22 MED ORDER — BENZONATATE 100 MG PO CAPS: 100.0000 mg | ORAL_CAPSULE | Freq: Three times a day (TID) | ORAL | 0 refills | Status: DC | PRN

## 2019-05-22 NOTE — Progress Notes (Signed)
E-Visit for Corona Virus Screening   Your current symptoms could be consistent with the coronavirus.  Many health care providers can now test patients at their office but not all are.  [facility] has multiple testing sites. For information on our COVID testing locations and hours go to [email or web address]  Please quarantine yourself while awaiting your test results.  We are enrolling you in our MyChart Home Montioring for COVID19 . Daily you will receive a questionnaire within the MyChart website. Our COVID 19 response team willl be monitoriing your responses daily.  You can go to one of the  testing sites listed below, while they are opened (see hours). You do not need an order and will stay in your car during the test. You do need to self isolate until your results return and if positive 14 days from when your symptoms started and until you are 3 days symptom free.   Testing Locations (Monday - Friday, 8 a.m. - 3:30 p.m.) . [place]: [facility], [address], [place], [place]  . [place]: [facility], [address], [place], [place] (entrance [address])  . [place]: (Closed each Monday): Testing site relocated to the short stay covered drive at [facility]. (Use the [address] entrance to [facility] next to [facility].)   COVID-19 is a respiratory illness with symptoms that are similar to the flu. Symptoms are typically mild to moderate, but there have been cases of severe illness and death due to the virus. The following symptoms may appear 2-14 days after exposure: . Fever . Cough . Shortness of breath or difficulty breathing . Chills . Repeated shaking with chills . Muscle pain . Headache . Sore throat . New loss of taste or smell . Fatigue . Congestion or runny nose . Nausea or vomiting . Diarrhea  It is vitally important that if you feel that you  have an infection such as this virus or any other virus that you stay home and away from places where you may spread it to others.  You should self-quarantine for 14 days if you have symptoms that could potentially be coronavirus or have been in close contact a with a person diagnosed with COVID-19 within the last 2 weeks. You should avoid contact with people age 65 and older.   You should wear a mask or cloth face covering over your nose and mouth if you must be around other people or animals, including pets (even at home). Try to stay at least 6 feet away from other people. This will protect the people around you.  You can use medication such as A prescription cough medication called Tessalon Perles 100 mg. You may take 1-2 capsules every 8 hours as needed for cough.  You may also take acetaminophen (Tylenol) as needed for fever.   Reduce your risk of any infection by using the same precautions used for avoiding the common cold or flu:  . Wash your hands often with soap and warm water for at least 20 seconds.  If soap and water are not readily available, use an alcohol-based hand sanitizer with at least 60% alcohol.  . If coughing or sneezing, cover your mouth and nose by coughing or sneezing into the elbow areas of your shirt or coat, into a tissue or into your sleeve (not your hands). . Avoid shaking hands with others and consider head nods or verbal greetings only. . Avoid touching   your eyes, nose, or mouth with unwashed hands.  . Avoid close contact with people who are sick. . Avoid places or events with large numbers of people in one location, like concerts or sporting events. . Carefully consider travel plans you have or are making. . If you are planning any travel outside or inside the [place], visit the CDC's Travelers' Health webpage for the latest health notices. . If you have some symptoms but not all symptoms, continue to monitor at home and seek medical attention if your symptoms  worsen. . If you are having a medical emergency, call 911.  HOME CARE . Only take medications as instructed by your medical team. . Drink plenty of fluids and get plenty of rest. . A steam or ultrasonic humidifier can help if you have congestion.   GET HELP RIGHT AWAY IF YOU HAVE EMERGENCY WARNING SIGNS** FOR COVID-19. If you or someone is showing any of these signs seek emergency medical care immediately. Call 911 or proceed to your closest emergency facility if: . You develop worsening high fever. . Trouble breathing . Bluish lips or face . Persistent pain or pressure in the chest . New confusion . Inability to wake or stay awake . You cough up blood. . Your symptoms become more severe  **This list is not all possible symptoms. Contact your medical provider for any symptoms that are sever or concerning to you.   MAKE SURE YOU   Understand these instructions.  Will watch your condition.  Will get help right away if you are not doing well or get worse.   Approximately 5 minutes was spent documenting and reviewing patient's chart.   Your e-visit answers were reviewed by a board certified advanced clinical practitioner to complete your personal care plan.  Depending on the condition, your plan could have included both over the counter or prescription medications.  If there is a problem please reply once you have received a response from your provider.  Your safety is important to us.  If you have drug allergies check your prescription carefully.    You can use MyChart to ask questions about today's visit, request a non-urgent call back, or ask for a work or school excuse for 24 hours related to this e-Visit. If it has been greater than 24 hours you will need to follow up with your provider, or enter a new e-Visit to address those concerns. You will get an e-mail in the next two days asking about your experience.  I hope that your e-visit has been valuable and will speed your  recovery. Thank you for using e-visits.    

## 2019-05-27 ENCOUNTER — Ambulatory Visit: Payer: 59 | Admitting: Psychology

## 2019-06-03 ENCOUNTER — Ambulatory Visit: Payer: 59 | Admitting: Psychology

## 2019-06-08 ENCOUNTER — Ambulatory Visit: Payer: Managed Care, Other (non HMO)

## 2019-06-08 ENCOUNTER — Other Ambulatory Visit: Payer: Self-pay

## 2019-06-08 DIAGNOSIS — M546 Pain in thoracic spine: Secondary | ICD-10-CM

## 2019-06-08 DIAGNOSIS — G8929 Other chronic pain: Secondary | ICD-10-CM

## 2019-06-08 DIAGNOSIS — R293 Abnormal posture: Secondary | ICD-10-CM

## 2019-06-08 DIAGNOSIS — M25511 Pain in right shoulder: Secondary | ICD-10-CM

## 2019-06-08 DIAGNOSIS — M6281 Muscle weakness (generalized): Secondary | ICD-10-CM

## 2019-06-08 NOTE — Therapy (Signed)
[facility] [address], [place], [place], [address] Phone: [phone]   Fax:  [phone]  Physical Therapy Treatment  Patient Details  Name: Robyn Long MRN: [id number] Date of Birth: 04/06/1986 Referring Provider (PT): Hulan Saas, MD   Encounter Date: 06/08/2019  PT End of Session - 06/08/19 1054    Visit Number  15    Authorization - Visit Number  14    Authorization - Number of Visits  60    PT Start Time  6962    PT Stop Time  1056    PT Time Calculation (min)  42 min    Activity Tolerance  Patient tolerated treatment well    Behavior During Therapy  [facility] for tasks assessed/performed       Past Medical History:  Diagnosis Date  . Abnormal Pap smear of cervix 2014  . Allergy   . Arthritis   . Depression   . History of chicken pox     Past Surgical History:  Procedure Laterality Date  . CRYOTHERAPY  2014   for abnormal pap smear  . HIP SURGERY     right hip    There were no vitals filed for this visit.  Subjective Assessment - 06/08/19 1020    Subjective  I am 60% better.  I have good days and bad days.    Currently in Pain?  Yes    Pain Score  2     Pain Location  Scapula    Pain Orientation  Right    Pain Descriptors / Indicators  Aching    Pain Type  Chronic pain    Pain Onset  More than a month ago    Pain Frequency  Intermittent    Aggravating Factors   cleaning, repetative motion, sometimes it is random    Pain Relieving Factors  dry needling, ice, stretching         [facility] PT Assessment - 06/08/19 0001      Assessment   Medical Diagnosis  scapular dyskinesis    Referring Provider (PT)  Hulan Saas, MD    Onset Date/Surgical Date  04/01/17      Prior Function   Level of Independence  Independent    Vocation Requirements  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[employer], [place]    Leisure  yoga      Observation/Other Assessments   Focus on Therapeutic Outcomes (FOTO)    25% limitation      Strength   Right Shoulder Extension  4+/5    Right Shoulder External Rotation  5/5      Palpation   Palpation comment  mild palpable tenderness over Rt lat, lower trap, anterior deltoid and upper  traps                   [facility] Adult PT Treatment/Exercise - 06/08/19 0001      Lumbar Exercises: Aerobic   UBE (Upper Arm Bike)  L3 3x3 with PT present to discuss status      Shoulder Exercises: ROM/Strengthening   Other ROM/Strengthening Exercises  Standing body Blade ossilations 40 sec 2x vertical hold & horizontal     Other ROM/Strengthening Exercises  Body blade with overhead flexion ( full range) 10x       Manual Therapy   Manual Therapy  Myofascial release;Soft tissue mobilization    Manual therapy comments  Rt posterior deltoid, thoracic parapsinals, subscapularis and rhomboids- paplation and assessment during dry needling  Trigger Point Dry Needling - 06/08/19 0001    Consent Given?  Yes    Muscles Treated Upper Quadrant  Subscapularis;Rhomboids;Deltoid   Rt only   Dry Needling Comments  thoracic multifidi    Rhomboids Response  Twitch response elicited;Palpable increased muscle length    Subscapularis Response  Twitch response elicited;Palpable increased muscle length    Deltoid Response  Twitch response elicited;Palpable increased muscle length             PT Short Term Goals - 03/28/19 0942      PT SHORT TERM GOAL #1   Title  be indpendent in initial HEP    Status  Achieved      PT SHORT TERM GOAL #2   Title  report a 30% reduction in Rt shoulder pain and scapular tingling with use of the Rt UE    Baseline  50% less frequent    Status  Achieved      PT SHORT TERM GOAL #3   Title  demonstrate use of the Rt UE without scapular elevation    Baseline  still elevating with functional use    Time  4    Period  Weeks    Status  On-going        PT Long Term Goals - 06/08/19 1023      PT LONG TERM GOAL #1   Title  be  independent in advanced HEP    Status  Achieved      PT LONG TERM GOAL #2   Title  reduce FOTO to < or = to 22% limitation    Baseline  25% limitation    Status  Not Met      PT LONG TERM GOAL #3   Title  report > or = to 70% reduction in Rt UE pain with use of Rt UE    Baseline  60% limitation    Status  Partially Met      PT LONG TERM GOAL #4   Title  demonstrate 5/5 Rt shoulder external rotation and extension to improve postural endurance to reduce anterior Rt shoulder pain    Baseline  extension 4+/5 ER 5/5    Status  Partially Met            Plan - 06/08/19 1031    Clinical Impression Statement  Pt reports 60% overall improvement in symptoms since the start of care.  Pt with varying pain levels in the Rt shoulder and this is related to use of the Rt UE repetitively.  Pt with improved Rt shoulder ER and extension is 4+/5 indicating improved postural strength.  Pt has HEP in place for continued gains.  Pt with mild trigger points in Rt scapular and posterior shoulder which are significantly reduced since the start of care.  Pt will D/C to HEP today.    Comorbidities  s/p Rt hip arthroscopy 12/2018    PT Next Visit Plan  D/C PT to HEP today    PT Home Exercise Plan  Access Code: [email or web address]    Consulted and Agree with Plan of Care  Patient       Patient will benefit from skilled therapeutic intervention in order to improve the following deficits and impairments:     Visit Diagnosis: Muscle weakness (generalized)  Pain in thoracic spine  Chronic right shoulder pain  Abnormal posture     Problem List Patient Active Problem List   Diagnosis Date Noted  . Large mass of breast 05/02/2019  .  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 Elevated blood pressure reading 12/13/2018  . Preop examination 12/13/2018  . Trigger point of right shoulder region 10/06/2018  . Right shoulder pain 06/15/2018  . Pain of left calf 12/30/2017  . Labral tear of hip joint 12/09/2017   . Nonallopathic lesion of thoracic region 07/01/2017  . Nonallopathic lesion of sacral region 07/01/2017  . Nonallopathic lesion of lumbosacral region 07/01/2017  . Acute bursitis of right shoulder 06/18/2017  . Scapular dyskinesis 06/18/2017  . Right hip pain 03/23/2017  . Constipation 04/08/2016  . Cervical intraepithelial neoplasia grade 2 02/10/2013  . Knee pain, right anterior 01/06/2013    PHYSICAL THERAPY DISCHARGE SUMMARY  Visits from Start of Care: 15  Current functional level related to goals / functional outcomes: See above for current status.    Remaining deficits: Rt scapular pain with repetative use of Rt UE and trigger points/spasm.  Pt has HEP in place for continued strength and endurance.     Education / Equipment: Posture, HEP Plan: Patient agrees to discharge.  Patient goals were partially met. Patient is being discharged due to being pleased with the current functional level.  ?????        Sigurd Sos, PT 06/08/19 10:57 AM  [facility] [address] [place], [place], [address] Phone: [phone]   Fax:  [phone]  Name: Robyn Long MRN: [id number] Date of Birth: 1986/07/05

## 2019-06-10 ENCOUNTER — Ambulatory Visit: Payer: 59 | Admitting: Psychology

## 2019-06-16 ENCOUNTER — Ambulatory Visit: Payer: Managed Care, Other (non HMO) | Admitting: Obstetrics and Gynecology

## 2019-06-17 ENCOUNTER — Ambulatory Visit: Payer: 59 | Admitting: Psychology

## 2019-06-21 ENCOUNTER — Encounter: Payer: Self-pay | Admitting: Plastic Surgery

## 2019-06-21 ENCOUNTER — Other Ambulatory Visit: Payer: Self-pay

## 2019-06-21 ENCOUNTER — Ambulatory Visit: Payer: Managed Care, Other (non HMO) | Admitting: Plastic Surgery

## 2019-06-21 DIAGNOSIS — G8929 Other chronic pain: Secondary | ICD-10-CM | POA: Diagnosis not present

## 2019-06-21 DIAGNOSIS — M546 Pain in thoracic spine: Secondary | ICD-10-CM | POA: Diagnosis not present

## 2019-06-21 DIAGNOSIS — M549 Dorsalgia, unspecified: Secondary | ICD-10-CM | POA: Insufficient documentation

## 2019-06-21 DIAGNOSIS — M542 Cervicalgia: Secondary | ICD-10-CM | POA: Diagnosis not present

## 2019-06-21 DIAGNOSIS — N62 Hypertrophy of breast: Secondary | ICD-10-CM | POA: Diagnosis not present

## 2019-06-21 NOTE — Progress Notes (Signed)
Patient ID: Robyn Long, female    DOB: 08-29-1985, 33 y.o.   MRN: [id number]   Chief Complaint  Patient presents with  . Breast Problem    Mammary Hyperplasia: The patient is a 33 y.o. female with a history of mammary hyperplasia for several years.  She has extremely large breasts causing symptoms that include the following: Back pain in the upper and lower back, including neck pain. She pulls or pins her bra straps to provide better lift and relief of the pressure and pain. She notices relief by holding her breast up manually.  Her shoulder straps cause grooves and pain and pressure that requires padding for relief. Pain medication is sometimes required with motrin and tylenol.  Activities that are hindered by enlarged breasts include: exercise and running.  I the patient has been to physical therapy and a back doctor with multiple complaints of back and neck pain unrelieved by conservative treatment.  Her breasts are extremely large and fairly symmetric with the left slightly lower than the right.  She has hyperpigmentation of the inframammary area on both sides.  The sternal to nipple distance on the right is 31 cm and the left is 33 cm.  The IMF distance is 12 cm.  She is 5 feet 11 inches tall and weighs 195 pounds.  Preoperative bra size = 38 DD cup.  She would like to be around a C cup. The estimated excess breast tissue to be removed at the time of surgery = 630 grams on the left and 630 grams on the right.  Mammogram history: None closest relatives with breast cancer is her mother's sister.   Review of Systems  Constitutional: Positive for activity change. Negative for appetite change.  HENT: Negative.   Eyes: Negative.   Respiratory: Negative.  Negative for chest tightness and shortness of breath.   Endocrine: Negative.   Genitourinary: Negative.   Musculoskeletal: Negative.   Skin: Negative.   Neurological: Negative.   Hematological: Negative.   Psychiatric/Behavioral:  Negative.     Past Medical History:  Diagnosis Date  . Abnormal Pap smear of cervix 2014  . Allergy   . Arthritis   . Depression   . History of chicken pox     Past Surgical History:  Procedure Laterality Date  . CRYOTHERAPY  2014   for abnormal pap smear  . HIP SURGERY     right hip      Current Outpatient Medications:  .  benzonatate (TESSALON PERLES) 100 MG capsule, Take 1 capsule (100 mg total) by mouth 3 (three) times daily as needed. (Patient not taking: Reported on 06/21/2019), Disp: 20 capsule, Rfl: 0 .  gabapentin (NEURONTIN) 300 MG capsule, 1 PO q HS, may increase to 1 PO BID if needed (Patient not taking: Reported on 06/21/2019), Disp: 60 capsule, Rfl: 6 .  MAGNESIUM PO, Take by mouth., Disp: , Rfl:  .  Multiple Vitamins-Minerals (WOMENS MULTIVITAMIN PLUS) TABS, Take 1 tablet by mouth daily., Disp: , Rfl:  .  VITAMIN D PO, Take by mouth., Disp: , Rfl:    Objective:   Vitals:   06/21/19 1040  BP: 114/78  Pulse: 90  Temp: 97.9 F (36.6 C)  SpO2: 96%    Physical Exam Vitals signs and nursing note reviewed.  Constitutional:      Appearance: Normal appearance.  HENT:     Head: Normocephalic and atraumatic.  Eyes:     General:        Right  eye: No discharge.  Cardiovascular:     Rate and Rhythm: Normal rate.     Pulses: Normal pulses.     Heart sounds: No murmur.  Pulmonary:     Effort: Pulmonary effort is normal. No respiratory distress.  Abdominal:     General: Abdomen is flat. There is no distension.  Skin:    General: Skin is warm.  Neurological:     General: No focal deficit present.     Mental Status: She is alert and oriented to person, place, and time.  Psychiatric:        Mood and Affect: Mood normal.        Behavior: Behavior normal.        Thought Content: Thought content normal.     Assessment & Plan:  Chronic bilateral thoracic back pain  Neck pain  Symptomatic mammary hypertrophy Patient would benefit from a bilateral breat  reduction.  PT and ortho notes in the chart.  Mammogram ordered. Pictures were obtained of the patient and placed in the chart with the patient's or guardian's permission.   [place], DO

## 2019-06-22 ENCOUNTER — Ambulatory Visit: Payer: Managed Care, Other (non HMO) | Admitting: Family Medicine

## 2019-06-23 ENCOUNTER — Ambulatory Visit (INDEPENDENT_AMBULATORY_CARE_PROVIDER_SITE_OTHER): Payer: Managed Care, Other (non HMO) | Admitting: Internal Medicine

## 2019-06-23 ENCOUNTER — Encounter: Payer: Self-pay | Admitting: Internal Medicine

## 2019-06-23 ENCOUNTER — Other Ambulatory Visit: Payer: Self-pay

## 2019-06-23 ENCOUNTER — Other Ambulatory Visit (INDEPENDENT_AMBULATORY_CARE_PROVIDER_SITE_OTHER): Payer: Managed Care, Other (non HMO)

## 2019-06-23 VITALS — BP 122/82 | HR 117 | Temp 98.4°F | Ht 71.0 in | Wt 200.0 lb

## 2019-06-23 DIAGNOSIS — K5909 Other constipation: Secondary | ICD-10-CM

## 2019-06-23 DIAGNOSIS — R10A1 Flank pain, right side: Secondary | ICD-10-CM | POA: Insufficient documentation

## 2019-06-23 DIAGNOSIS — R399 Unspecified symptoms and signs involving the genitourinary system: Secondary | ICD-10-CM

## 2019-06-23 DIAGNOSIS — R109 Unspecified abdominal pain: Secondary | ICD-10-CM

## 2019-06-23 DIAGNOSIS — R300 Dysuria: Secondary | ICD-10-CM | POA: Insufficient documentation

## 2019-06-23 MED ORDER — CEFTRIAXONE SODIUM 1 G IJ SOLR
1.0000 g | Freq: Once | INTRAMUSCULAR | Status: AC
  Administered 2019-06-23: 1 g via INTRAMUSCULAR

## 2019-06-23 MED ORDER — AMOXICILLIN-POT CLAVULANATE 875-125 MG PO TABS: 1.0000 | ORAL_TABLET | Freq: Two times a day (BID) | ORAL | 0 refills | Status: DC

## 2019-06-23 NOTE — Patient Instructions (Addendum)
You had the antibiotic shot today (rocephin) for probable Urinary Tract Infection and even possible Right Pyelonephritis  Please take all new medication as prescribed - the augmentin for 10 days  The urine culture results will be back in 2-3 days, and I will try to have the results up by then on Mychart  Please continue all other medications as before, and refills have been done if requested.  Please have the pharmacy call with any other refills you may need.  Please keep your appointments with your specialists as you may have planned

## 2019-06-23 NOTE — Assessment & Plan Note (Signed)
?   Kidney related, for tx as above

## 2019-06-23 NOTE — Progress Notes (Signed)
Subjective:    Patient ID: Robyn Long, female    DOB: [birth year], 33 y.o.   MRN: [id number]  HPI  Here with 5 days onset lower abd pressure, dysuria, urgency and right flank discomofrt, without high fever, chills, n/v or confusion.  Has also been lethargic and occasionally mild dizzy.  Has hx of pyelonephritis years ago she let go too long. Cant recall last UTI.  Seen at UC, rx cipro x 7 days and seemed better the first 2 days but was also taking azo, then worse again.  Denies worsening reflux, dysphagia, n/v, bowel change or blood.  Pt denies chest pain, increased sob or doe, wheezing, orthopnea, PND, increased LE swelling, palpitations, dizziness or syncope.   Pt denies polydipsia, polyuria Past Medical History:  Diagnosis Date  . Abnormal Pap smear of cervix 2014  . Allergy   . Arthritis   . Depression   . History of chicken pox    Past Surgical History:  Procedure Laterality Date  . CRYOTHERAPY  2014   for abnormal pap smear  . HIP SURGERY     right hip    reports that she has never smoked. She has never used smokeless tobacco. She reports current alcohol use of about 2.0 standard drinks of alcohol per week. She reports that she does not use drugs. family history includes Breast cancer (age of onset: 82) in her maternal aunt; Colon polyps in her mother; Hypertension in her mother; Lung cancer in her maternal grandfather and paternal grandfather; Lymphoma in her mother; Mitral valve prolapse in her father, paternal aunt, and paternal aunt. No Known Allergies Current Outpatient Medications on File Prior to Visit  Medication Sig Dispense Refill  . benzonatate (TESSALON PERLES) 100 MG capsule Take 1 capsule (100 mg total) by mouth 3 (three) times daily as needed. 20 capsule 0  . gabapentin (NEURONTIN) 300 MG capsule 1 PO q HS, may increase to 1 PO BID if needed 60 capsule 6  . MAGNESIUM PO Take by mouth.    . Multiple Vitamins-Minerals (WOMENS MULTIVITAMIN PLUS) TABS Take 1 tablet  by mouth daily.    Marland Kitchen VITAMIN D PO Take by mouth.     No current facility-administered medications on file prior to visit.    Review of Systems  Constitutional: Negative for other unusual diaphoresis or sweats HENT: Negative for ear discharge or swelling Eyes: Negative for other worsening visual disturbances Respiratory: Negative for stridor or other swelling  Gastrointestinal: Negative for worsening distension or other blood Genitourinary: Negative for retention or other urinary change Musculoskeletal: Negative for other MSK pain or swelling Skin: Negative for color change or other new lesions Neurological: Negative for worsening tremors and other numbness  Psychiatric/Behavioral: Negative for worsening agitation or other fatigue All otherwise neg per pt     Objective:   Physical Exam BP 122/82   Pulse (!) 117   Temp 98.4 F (36.9 C) (Oral)   Ht 5\' 11"  (1.803 m)   Wt 200 lb (90.7 kg)   SpO2 97%   BMI 27.89 kg/m  VS noted,  Constitutional: Pt appears in NAD HENT: Head: NCAT.  Right Ear: External ear normal.  Left Ear: External ear normal.  Eyes: . Pupils are equal, round, and reactive to light. Conjunctivae and EOM are normal Nose: without d/c or deformity Neck: Neck supple. Gross normal ROM Cardiovascular: Normal rate and regular rhythm.   Pulmonary/Chest: Effort normal and breath sounds without rales or wheezing.  Abd:  Soft, ND, + BS,  no organomegaly, with moderate low mid abd tender, no guarding or rebound, + mild right flank tender Neurological: Pt is alert. At baseline orientation, motor grossly intact Skin: Skin is warm. No rashes, other new lesions, no LE edema Psychiatric: Pt behavior is normal without agitation  All otherwise neg per pt Lab Results  Component Value Date   WBC 5.2 05/17/2019   HGB 13.3 05/17/2019   HCT 39.7 05/17/2019   PLT 296 05/17/2019   GLUCOSE 92 05/17/2019   CHOL 178 05/17/2019   TRIG 71 05/17/2019   HDL 66 05/17/2019   LDLCALC 99  05/17/2019   ALT 7 05/17/2019   AST 15 05/17/2019   NA 139 05/17/2019   K 4.3 05/17/2019   CL 101 05/17/2019   CREATININE 0.85 05/17/2019   BUN 15 05/17/2019   CO2 24 05/17/2019   TSH 1.07 06/11/2015   HGBA1C 5.4 11/26/2012      Assessment & Plan:

## 2019-06-23 NOTE — Assessment & Plan Note (Signed)
Likely uti possible pyelonephritis, for urine studies, rocephin IM 1 gm, change cipro to augmentin asd, f/u cultures

## 2019-06-24 ENCOUNTER — Ambulatory Visit (INDEPENDENT_AMBULATORY_CARE_PROVIDER_SITE_OTHER): Payer: 59 | Admitting: Psychology

## 2019-06-24 DIAGNOSIS — F332 Major depressive disorder, recurrent severe without psychotic features: Secondary | ICD-10-CM

## 2019-06-24 LAB — URINALYSIS, ROUTINE W REFLEX MICROSCOPIC
Bilirubin Urine: NEGATIVE
Ketones, ur: NEGATIVE
Nitrite: POSITIVE — AB
Specific Gravity, Urine: >=1.03 — AB (ref 1.000–1.030)
Total Protein, Urine: >=300 — AB
Urine Glucose: 100 — AB
Urobilinogen, UA: 0.2 (ref 0.0–1.0)
pH: 6 (ref 5.0–8.0)

## 2019-06-25 LAB — URINE CULTURE
MICRO NUMBER:: 1094019
SPECIMEN QUALITY:: ADEQUATE

## 2019-06-27 ENCOUNTER — Other Ambulatory Visit: Payer: Self-pay

## 2019-06-27 ENCOUNTER — Telehealth: Payer: Self-pay | Admitting: Internal Medicine

## 2019-06-27 ENCOUNTER — Ambulatory Visit
Admission: RE | Admit: 2019-06-27 | Discharge: 2019-06-27 | Disposition: A | Discharge status: Home / Self Care | Payer: Managed Care, Other (non HMO) | Source: Ambulatory Visit | Attending: Plastic Surgery | Admitting: Plastic Surgery

## 2019-06-27 DIAGNOSIS — M542 Cervicalgia: Secondary | ICD-10-CM

## 2019-06-27 DIAGNOSIS — N62 Hypertrophy of breast: Secondary | ICD-10-CM

## 2019-06-27 DIAGNOSIS — G8929 Other chronic pain: Secondary | ICD-10-CM

## 2019-06-27 NOTE — Telephone Encounter (Signed)
Patient called back.  I gave her Dr. Gwynn Burly response to labs.  Patient understood.   Patient would like to know if there is anything she needs to avoid or look out for while having this UTI?

## 2019-06-27 NOTE — Telephone Encounter (Signed)
Not really, just if feeling worse overall, more pain, weakness or falling, fever, chills or blood in the urine - should go to ED for any of this

## 2019-06-27 NOTE — Telephone Encounter (Signed)
My-chart message sent to patient today with info.

## 2019-07-01 ENCOUNTER — Encounter (HOSPITAL_COMMUNITY): Payer: Self-pay | Admitting: Emergency Medicine

## 2019-07-01 ENCOUNTER — Other Ambulatory Visit: Payer: Self-pay

## 2019-07-01 ENCOUNTER — Emergency Department (HOSPITAL_COMMUNITY): Payer: Managed Care, Other (non HMO)

## 2019-07-01 ENCOUNTER — Ambulatory Visit: Payer: 59 | Admitting: Psychology

## 2019-07-01 ENCOUNTER — Emergency Department (HOSPITAL_COMMUNITY)
Admission: EM | Admit: 2019-07-01 | Discharge: 2019-07-01 | Disposition: A | Discharge status: Home / Self Care | Payer: Managed Care, Other (non HMO) | Attending: Emergency Medicine | Admitting: Emergency Medicine

## 2019-07-01 DIAGNOSIS — N10 Acute tubulo-interstitial nephritis: Secondary | ICD-10-CM | POA: Insufficient documentation

## 2019-07-01 DIAGNOSIS — Z79899 Other long term (current) drug therapy: Secondary | ICD-10-CM | POA: Diagnosis not present

## 2019-07-01 DIAGNOSIS — R1031 Right lower quadrant pain: Secondary | ICD-10-CM | POA: Diagnosis present

## 2019-07-01 DIAGNOSIS — N12 Tubulo-interstitial nephritis, not specified as acute or chronic: Secondary | ICD-10-CM

## 2019-07-01 LAB — BASIC METABOLIC PANEL
Anion gap: 9 (ref 5–15)
BUN: 14 mg/dL (ref 6–20)
CO2: 22 mmol/L (ref 22–32)
Calcium: 8.8 mg/dL — ABNORMAL LOW (ref 8.9–10.3)
Chloride: 105 mmol/L (ref 98–111)
Creatinine, Ser: 0.95 mg/dL (ref 0.44–1.00)
GFR calc Af Amer: >60 mL/min (ref >60)
GFR calc non Af Amer: >60 mL/min (ref >60)
Glucose, Bld: 132 mg/dL — ABNORMAL HIGH (ref 70–99)
Potassium: 4.8 mmol/L (ref 3.5–5.1)
Sodium: 136 mmol/L (ref 135–145)

## 2019-07-01 LAB — URINALYSIS, ROUTINE W REFLEX MICROSCOPIC
Bilirubin Urine: NEGATIVE
Glucose, UA: NEGATIVE mg/dL
Hgb urine dipstick: NEGATIVE
Ketones, ur: NEGATIVE mg/dL
Leukocytes,Ua: NEGATIVE
Nitrite: NEGATIVE
Protein, ur: NEGATIVE mg/dL
Specific Gravity, Urine: 1.013 (ref 1.005–1.030)
pH: 6 (ref 5.0–8.0)

## 2019-07-01 LAB — CBC
HCT: 41.9 % (ref 36.0–46.0)
Hemoglobin: 12.8 g/dL (ref 12.0–15.0)
MCH: 30.9 pg (ref 26.0–34.0)
MCHC: 30.5 g/dL (ref 30.0–36.0)
MCV: 101.2 fL — ABNORMAL HIGH (ref 80.0–100.0)
Platelets: 186 10*3/uL (ref 150–400)
RBC: 4.14 MIL/uL (ref 3.87–5.11)
RDW: 12.8 % (ref 11.5–15.5)
WBC: 6.3 10*3/uL (ref 4.0–10.5)
nRBC: 0 % (ref 0.0–0.2)

## 2019-07-01 LAB — HEPATIC FUNCTION PANEL
ALT: 10 U/L (ref 0–44)
AST: 31 U/L (ref 15–41)
Albumin: 4.4 g/dL (ref 3.5–5.0)
Alkaline Phosphatase: 67 U/L (ref 38–126)
Bilirubin, Direct: 0.5 mg/dL — ABNORMAL HIGH (ref 0.0–0.2)
Indirect Bilirubin: 0.3 mg/dL (ref 0.3–0.9)
Total Bilirubin: 0.8 mg/dL (ref 0.3–1.2)
Total Protein: 7.5 g/dL (ref 6.5–8.1)

## 2019-07-01 LAB — POC URINE PREG, ED: Preg Test, Ur: NEGATIVE

## 2019-07-01 MED ORDER — NITROFURANTOIN MONOHYD MACRO 100 MG PO CAPS: 100.0000 mg | ORAL_CAPSULE | Freq: Two times a day (BID) | ORAL | 0 refills | Status: AC

## 2019-07-01 NOTE — ED Provider Notes (Signed)
[facility] DEPT Provider Note   CSN: [id number] Arrival date & time: 07/01/19  1513     History   Chief Complaint Chief Complaint  Patient presents with  . Recurrent UTI    HPI Robyn Long is a 33 y.o. female who presents with right flank and right-sided abdominal pain after being treated for a urinary tract infection over the past few weeks.  Patient reports she initially had urgency and dysuria and was treated with Cipro at a urgent care.  She was not having improvement and she went to her primary care provider who started her on Augmentin, which she is about to finish tomorrow.  Her urine culture grew ESBL.  She is reporting continued right flank pain, chills, and lightheadedness.  She denies nausea or vomiting.  She reports her urinary symptoms have improved.     HPI  Past Medical History:  Diagnosis Date  . Abnormal Pap smear of cervix 2014  . Allergy   . Arthritis   . Depression   . History of chicken pox     Patient Active Problem List   Diagnosis Date Noted  . Dysuria 06/23/2019  . Right flank pain 06/23/2019  . Back pain 06/21/2019  . Neck pain 06/21/2019  . Symptomatic mammary hypertrophy 06/21/2019  . Large mass of breast 05/02/2019  . 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 Elevated blood pressure reading 12/13/2018  . Preop examination 12/13/2018  . Trigger point of right shoulder region 10/06/2018  . Right shoulder pain 06/15/2018  . Pain of left calf 12/30/2017  . Labral tear of hip joint 12/09/2017  . Nonallopathic lesion of thoracic region 07/01/2017  . Nonallopathic lesion of sacral region 07/01/2017  . Nonallopathic lesion of lumbosacral region 07/01/2017  . Acute bursitis of right shoulder 06/18/2017  . Scapular dyskinesis 06/18/2017  . Right hip pain 03/23/2017  . Constipation 04/08/2016  . Cervical intraepithelial neoplasia grade 2 02/10/2013  . Knee pain, right anterior 01/06/2013    Past Surgical  History:  Procedure Laterality Date  . CRYOTHERAPY  2014   for abnormal pap smear  . HIP SURGERY     right hip     OB History    Gravida  0   Para  0   Term  0   Preterm  0   AB  0   Living  0     SAB  0   TAB  0   Ectopic  0   Multiple  0   Live Births               Home Medications    Prior to Admission medications   Medication Sig Start Date End Date Taking? Authorizing Provider  amoxicillin-clavulanate (AUGMENTIN) 875-125 MG tablet Take 1 tablet by mouth 2 (two) times daily. 06/23/19   Biagio Borg, MD  benzonatate (TESSALON PERLES) 100 MG capsule Take 1 capsule (100 mg total) by mouth 3 (three) times daily as needed. 05/22/19   Sharion Balloon, FNP  gabapentin (NEURONTIN) 300 MG capsule 1 PO q HS, may increase to 1 PO BID if needed 05/26/18   Hilts, Michael, MD  MAGNESIUM PO Take by mouth.    [provider]  Multiple Vitamins-Minerals (WOMENS MULTIVITAMIN PLUS) TABS Take 1 tablet by mouth daily.    [provider]  nitrofurantoin, macrocrystal-monohydrate, (MACROBID) 100 MG capsule Take 1 capsule (100 mg total) by mouth 2 (two) times daily for 7 days. 07/01/19 07/08/19  Thong Feeny M, PA-C  VITAMIN D PO Take by mouth.    [provider]    Family History Family History  Problem Relation Age of Onset  . Mitral valve prolapse Father   . Hypertension Mother   . Colon polyps Mother   . Lymphoma Mother   . Breast cancer Maternal Aunt 40       deceased from breast cancer  . Lung cancer Paternal Grandfather   . Mitral valve prolapse Paternal Aunt   . Breast cancer Paternal Aunt        over 4550  . Lung cancer Maternal Grandfather   . Mitral valve prolapse Paternal Aunt   . Breast cancer Paternal Aunt        over 3350   . Colon cancer Neg Hx     Social History Social History   Tobacco Use  . Smoking status: Never Smoker  . Smokeless tobacco: Never Used  Substance Use Topics  . Alcohol use: Yes    Alcohol/week:  2.0 standard drinks    Types: 2 Standard drinks or equivalent per week  . Drug use: No     Allergies   Patient has no known allergies.   Review of Systems Review of Systems  Constitutional: Positive for chills and fatigue. Negative for fever.  HENT: Negative for facial swelling and sore throat.   Respiratory: Negative for shortness of breath.   Cardiovascular: Negative for chest pain.  Gastrointestinal: Positive for abdominal pain. Negative for nausea and vomiting.  Genitourinary: Positive for flank pain. Negative for dysuria.  Musculoskeletal: Negative for back pain.  Skin: Negative for rash and wound.  Neurological: Positive for light-headedness. Negative for headaches.  Psychiatric/Behavioral: The patient is not nervous/anxious.      Physical Exam Updated Vital Signs BP 122/85   Pulse 91   Temp 99 F (37.2 C) (Oral)   Resp 18   Ht 5\' 11"  (1.803 m)   Wt 76.2 kg   LMP 06/15/2019   SpO2 99%   BMI 23.43 kg/m   Physical Exam Vitals signs and nursing note reviewed.  Constitutional:      General: She is not in acute distress.    Appearance: She is well-developed. She is not diaphoretic.  HENT:     Head: Normocephalic and atraumatic.     Mouth/Throat:     Pharynx: No oropharyngeal exudate.  Eyes:     General: No scleral icterus.       Right eye: No discharge.        Left eye: No discharge.     Conjunctiva/sclera: Conjunctivae normal.     Pupils: Pupils are equal, round, and reactive to light.  Neck:     Musculoskeletal: Normal range of motion and neck supple.     Thyroid: No thyromegaly.  Cardiovascular:     Rate and Rhythm: Normal rate and regular rhythm.     Heart sounds: Normal heart sounds. No murmur. No friction rub. No gallop.   Pulmonary:     Effort: Pulmonary effort is normal. No respiratory distress.     Breath sounds: Normal breath sounds. No stridor. No wheezing or rales.  Abdominal:     General: Bowel sounds are normal. There is no distension.      Palpations: Abdomen is soft.     Tenderness: There is abdominal tenderness in the right upper quadrant and right lower quadrant. There is right CVA tenderness. There is no left CVA tenderness, guarding or rebound.  Lymphadenopathy:  Cervical: No cervical adenopathy.  Skin:    General: Skin is warm and dry.     Coloration: Skin is not pale.     Findings: No rash.  Neurological:     Mental Status: She is alert.     Coordination: Coordination normal.      ED Treatments / Results  Labs (all labs ordered are listed, but only abnormal results are displayed) Labs Reviewed  BASIC METABOLIC PANEL - Abnormal; Notable for the following components:      Result Value   Glucose, Bld 132 (*)    Calcium 8.8 (*)    All other components within normal limits  CBC - Abnormal; Notable for the following components:   MCV 101.2 (*)    All other components within normal limits  HEPATIC FUNCTION PANEL - Abnormal; Notable for the following components:   Bilirubin, Direct 0.5 (*)    All other components within normal limits  URINE CULTURE  URINALYSIS, ROUTINE W REFLEX MICROSCOPIC  POC URINE PREG, ED  I-STAT BETA HCG BLOOD, ED ([facility], [facility], [facility] ONLY)    EKG None  Radiology Ct Renal Stone Study  Result Date: 07/01/2019 CLINICAL DATA:  Flank pain, stone disease suspected, concern for pyelo also rule out urolithiasis EXAM: CT ABDOMEN AND PELVIS WITHOUT CONTRAST TECHNIQUE: Multidetector CT imaging of the abdomen and pelvis was performed following the standard protocol without IV contrast. COMPARISON:  CT 11/27/2017 FINDINGS: Lower chest: Lung bases are clear. Normal heart size. No pericardial effusion. Hepatobiliary: No focal liver abnormality is seen. No gallstones, gallbladder wall thickening, or biliary dilatation. Pancreas: Unremarkable. No pancreatic ductal dilatation or surrounding inflammatory changes. Spleen: Normal in size without focal abnormality. Adrenals/Urinary Tract: Adrenal glands are  unremarkable. Kidneys are normal, without perinephric stranding, renal calculi, focal lesion, or hydronephrosis. Physiologic distension of the otherwise unremarkable urinary bladder. Stomach/Bowel: Distal esophagus, stomach and duodenal sweep are unremarkable. No small bowel wall thickening or dilatation. No evidence of obstruction. A normal retrocecal appendix is best visualized on sagittal imaging. No colonic dilatation or wall thickening. Vascular/Lymphatic: The aorta is normal caliber. No suspicious or enlarged lymph nodes in the included lymphatic chains. Reproductive: Normal appearance of the uterus and adnexal structures. Other: No abdominopelvic free fluid or free gas. No bowel containing hernias. Musculoskeletal: No acute osseous abnormality or suspicious osseous lesion. Multilevel Schmorl's node formation seen in the lower thoracic and lumbar spine. IMPRESSION: No acute intra-abdominal process. Specifically, no gross urinary tract abnormality is seen. Electronically Signed   By: Kreg Shropshire M.D.   On: 07/01/2019 20:16    Procedures Procedures (including critical care time)  Medications Ordered in ED Medications - No data to display   Initial Impression / Assessment and Plan / ED Course  I have reviewed the triage vital signs and the nursing notes.  Pertinent labs & imaging results that were available during my care of the patient were reviewed by me and considered in my medical decision making (see chart for details).        Patient presenting with ongoing right flank pain resolved urinary symptoms after being treated for recurrent UTI with Cipro and Augmentin.  Her urine culture showed ESBL.  CT renal stone study is negative.  UA is negative.  Labs are unremarkable.  Patient does have CVA tenderness and abdominal tenderness.  I discussed patient case with Dr. Lorenso Courier with infectious disease who advises nitrofurantoin for 7 days.  Follow-up to PCP discussed.  Strict return precautions  given including worsening pain, development  of fever, intractable vomiting, passing out, or any other concerning symptoms.  Patient understands and agrees with plan.  Patient vital stable throughout ED course and discharged in satisfactory condition.  Patient also evaluated by attending, Dr. Manus Gunning, who guided the patient's management and agrees with plan.  Final Clinical Impressions(s) / ED Diagnoses   Final diagnoses:  Pyelonephritis    ED Discharge Orders         Ordered    nitrofurantoin, macrocrystal-monohydrate, (MACROBID) 100 MG capsule  2 times daily     07/01/19 2147           Emi Holes, PA-C 07/01/19 2342    Glynn Octave, MD 07/02/19 0151

## 2019-07-01 NOTE — ED Triage Notes (Signed)
Patient pcp dx her with uti and told her to come to ED if it moves to her back. Patient is having chills, burning with urination and right back/flank pain.

## 2019-07-01 NOTE — Discharge Instructions (Addendum)
Stop taking Augmentin.  Take Macrobid as prescribed until completed.  Please return if you develop severe, worsening back or abdominal pain, intractable vomiting, persistent fever of 100.4, passing out, or any other concerning symptoms.

## 2019-07-01 NOTE — ED Notes (Signed)
Poc done on patient and DC'd hcg beta

## 2019-07-03 LAB — URINE CULTURE: Culture: NO GROWTH

## 2019-07-05 DIAGNOSIS — M25552 Pain in left hip: Secondary | ICD-10-CM | POA: Insufficient documentation

## 2019-07-08 ENCOUNTER — Ambulatory Visit: Payer: 59 | Admitting: Psychology

## 2019-07-12 ENCOUNTER — Ambulatory Visit (INDEPENDENT_AMBULATORY_CARE_PROVIDER_SITE_OTHER): Payer: Managed Care, Other (non HMO) | Admitting: Family Medicine

## 2019-07-12 ENCOUNTER — Encounter: Payer: Self-pay | Admitting: Family Medicine

## 2019-07-12 VITALS — BP 124/70 | HR 95 | Ht 71.0 in | Wt 205.0 lb

## 2019-07-12 DIAGNOSIS — G2589 Other specified extrapyramidal and movement disorders: Secondary | ICD-10-CM | POA: Diagnosis not present

## 2019-07-12 DIAGNOSIS — M999 Biomechanical lesion, unspecified: Secondary | ICD-10-CM | POA: Diagnosis not present

## 2019-07-12 NOTE — Progress Notes (Signed)
[name] [address] [place], [place] [address] Phone: [phone] Subjective:   I Robyn Long am serving as a Neurosurgeon for Dr. Antoine Primas.  This visit occurred during the SARS-CoV-2 public health emergency.  Safety protocols were in place, including screening questions prior to the visit, additional usage of staff PPE, and extensive cleaning of exam room while observing appropriate contact time as indicated for disinfecting solutions.    CC: Low back pain f/u  IWP:[id number]  Robyn Long is a 33 y.o. female coming in with complaint of back pain. Patient states she is doing well.  Still tightness of the right shoulder noted.  Still working on the scapular dyskinesis.  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  Patient will be having a left arthroscopy of the hip done in the near future.    Past Medical History:  Diagnosis Date  . Abnormal Pap smear of cervix 2014  . Allergy   . Arthritis   . Depression   . History of chicken pox    Past Surgical History:  Procedure Laterality Date  . CRYOTHERAPY  2014   for abnormal pap smear  . HIP SURGERY     right hip   Social History   Socioeconomic History  . Marital status: Married    Spouse name: Not on file  . Number of children: Not on file  . Years of education: 16+  . Highest education level: Not on file  Occupational History  . Occupation: Associate Professor: [employer]  Social Needs  . Financial resource strain: Not on file  . Food insecurity    Worry: Not on file    Inability: Not on file  . Transportation needs    Medical: Not on file    Non-medical: Not on file  Tobacco Use  . Smoking status: Never Smoker  . Smokeless tobacco: Never Used  Substance and Sexual Activity  . Alcohol use: Yes    Alcohol/week: 2.0 standard drinks   Types: 2 Standard drinks or equivalent per week  . Drug use: No  . Sexual activity: Yes    Partners: Male    Birth control/protection: Condom  Lifestyle  . Physical activity    Days per week: Not on file    Minutes per session: Not on file  . Stress: Not on file  Relationships  . Social Musician on phone: Not on file    Gets together: Not on file    Attends religious service: Not on file    Active member of club or organization: Not on file    Attends meetings of clubs or organizations: Not on file    Relationship status: Not on file  Other Topics Concern  . Not on file  Social History Narrative   Caffeine Use-yes   Regular exercise-yes   No Known Allergies Family History  Problem Relation Age of Onset  . Mitral valve prolapse Father   . Hypertension Mother   . Colon polyps Mother   . Lymphoma Mother   . Breast cancer Maternal Aunt 40       deceased from breast cancer  . Lung cancer Paternal Grandfather   . Mitral valve prolapse Paternal Aunt   . Breast cancer Paternal Aunt        over 58  . Lung cancer  Maternal Grandfather   . Mitral valve prolapse Paternal Aunt   . Breast cancer Paternal Aunt        over 76   . Colon cancer Neg Hx       Current Outpatient Medications (Respiratory):  .  benzonatate (TESSALON PERLES) 100 MG capsule, Take 1 capsule (100 mg total) by mouth 3 (three) times daily as needed.    Current Outpatient Medications (Other):  .  amoxicillin-clavulanate (AUGMENTIN) 875-125 MG tablet, Take 1 tablet by mouth 2 (two) times daily. Marland Kitchen  gabapentin (NEURONTIN) 300 MG capsule, 1 PO q HS, may increase to 1 PO BID if needed .  MAGNESIUM PO, Take by mouth. .  Multiple Vitamins-Minerals (WOMENS MULTIVITAMIN PLUS) TABS, Take 1 tablet by mouth daily. Marland Kitchen  VITAMIN D PO, Take by mouth.    Past medical history, social, surgical and family history all reviewed in electronic medical record.  No pertanent information unless stated regarding to the  chief complaint.   Review of Systems:  No headache, visual changes, nausea, vomiting, diarrhea, constipation, dizziness, abdominal pain, skin rash, fevers, chills, night sweats, weight loss, swollen lymph nodes, body aches, joint swelling,chest pain, shortness of breath, mood changes.  Positive muscle aches  Objective  Blood pressure 124/70, pulse 95, height 5\' 11"  (1.803 m), weight 205 lb (93 kg), last menstrual period 06/15/2019, SpO2 97 %.    General: No apparent distress alert and oriented x3 mood and affect normal, dressed appropriately.  HEENT: Pupils equal, extraocular movements intact  Respiratory: Patient's speak in full sentences and does not appear short of breath  Cardiovascular: No lower extremity edema, non tender, no erythema  Skin: Warm dry intact with no signs of infection or rash on extremities or on axial skeleton.  Abdomen: Soft nontender  Neuro: Cranial nerves II through XII are intact, neurovascularly intact in all extremities with 2+ DTRs and 2+ pulses.  Lymph: No lymphadenopathy of posterior or anterior cervical chain or axillae bilaterally.  Gait normal with good balance and coordination.  MSK:  tender with full range of motion and good stability and symmetric strength and tone of  elbows, wrist, hip, knee and ankles bilaterally.  Patient still has scapular dyskinesis on the right side.  Still has some hesitation of the inferior scapular region.  Patient does have tenderness to palpation in the parascapular region.  No spinous process tenderness.  Back exam still has some loss of lordosis.  Tender to palpation over the right sacroiliac joint, mild positive Corky Sox on the right.  Negative straight leg test.  Osteopathic findings  C4 flexed rotated and side bent left C7 flexed rotated and side bent left T3 extended rotated and side bent right inhaled third rib T9 extended rotated and side bent left L2 flexed rotated and side bent right Sacrum right on right     Impression and Recommendations:     This case required medical decision making of moderate complexity. The above documentation has been reviewed and is accurate and complete Lyndal Pulley, DO       Note: This dictation was prepared with Dragon dictation along with smaller phrase technology. Any transcriptional errors that result from this process are unintentional.

## 2019-07-12 NOTE — Patient Instructions (Signed)
Good to see you Follow up in 6 weeks Keep working on the shoulder

## 2019-07-12 NOTE — Assessment & Plan Note (Signed)
Discussed which activities to do which was to avoid.  Discussed posture and anomalies, discussed which activities to do which wants to avoid.  Patient will increase activity slowly.  Follow-up again in 4 to 8 weeks.

## 2019-07-12 NOTE — Assessment & Plan Note (Signed)
Decision today to treat with OMT was based on Physical Exam  After verbal consent patient was treated with HVLA, ME, FPR techniques in cervical, thoracic, lumbar and sacral areas  Patient tolerated the procedure well with improvement in symptoms  Patient given exercises, stretches and lifestyle modifications  See medications in patient instructions if given  Patient will follow up in 4-8 weeks 

## 2019-07-15 ENCOUNTER — Ambulatory Visit: Payer: 59 | Admitting: Psychology

## 2019-07-22 ENCOUNTER — Ambulatory Visit (INDEPENDENT_AMBULATORY_CARE_PROVIDER_SITE_OTHER): Payer: 59 | Admitting: Psychology

## 2019-07-22 DIAGNOSIS — F332 Major depressive disorder, recurrent severe without psychotic features: Secondary | ICD-10-CM

## 2019-07-29 ENCOUNTER — Ambulatory Visit: Payer: 59 | Admitting: Psychology

## 2019-08-19 ENCOUNTER — Ambulatory Visit: Payer: Self-pay | Admitting: Psychology

## 2019-08-22 ENCOUNTER — Ambulatory Visit: Payer: Managed Care, Other (non HMO) | Admitting: Family Medicine

## 2019-08-26 ENCOUNTER — Ambulatory Visit: Payer: 59 | Admitting: Psychology

## 2019-09-02 ENCOUNTER — Ambulatory Visit: Payer: 59 | Admitting: Psychology

## 2019-09-09 ENCOUNTER — Ambulatory Visit (INDEPENDENT_AMBULATORY_CARE_PROVIDER_SITE_OTHER): Payer: 59 | Admitting: Psychology

## 2019-09-09 DIAGNOSIS — F332 Major depressive disorder, recurrent severe without psychotic features: Secondary | ICD-10-CM | POA: Diagnosis not present

## 2019-09-14 ENCOUNTER — Ambulatory Visit (INDEPENDENT_AMBULATORY_CARE_PROVIDER_SITE_OTHER): Payer: Managed Care, Other (non HMO) | Admitting: Plastic Surgery

## 2019-09-14 ENCOUNTER — Encounter: Payer: Self-pay | Admitting: Plastic Surgery

## 2019-09-14 ENCOUNTER — Other Ambulatory Visit: Payer: Self-pay

## 2019-09-14 VITALS — BP 136/83 | HR 103 | Temp 97.5°F | Ht 71.0 in | Wt 203.4 lb

## 2019-09-14 DIAGNOSIS — N62 Hypertrophy of breast: Secondary | ICD-10-CM

## 2019-09-14 NOTE — Progress Notes (Signed)
Referring Provider Pincus Sanes, MD [address] [place],  [place] [address]   CC:  Chief Complaint  Patient presents with  . Advice Only    Consultation of breast reduction      Robyn Long is an 34 y.o. female.  HPI: Patient presents to discuss breast reduction.  She is had back pain neck pain and shoulder pain for years related to her large pendulous breast.  She has bra strap grooving and uses pads to decrease the pressure in that area.  She is tried anti-inflammatories with minimal relief.  She is limited in terms of her exercise by pain related to her breast.  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  No Known Allergies  Outpatient Encounter Medications as of 09/14/2019  Medication Sig  . MAGNESIUM PO Take by mouth.  . Multiple Vitamins-Minerals (WOMENS MULTIVITAMIN PLUS) TABS Take 1 tablet by mouth daily.  Marland Kitchen VITAMIN D PO Take by mouth.  Marland Kitchen amoxicillin-clavulanate (AUGMENTIN) 875-125 MG tablet Take 1 tablet by mouth 2 (two) times daily.  . benzonatate (TESSALON PERLES) 100 MG capsule Take 1 capsule (100 mg total) by mouth 3 (three) times daily as needed.  . gabapentin (NEURONTIN) 300 MG capsule 1 PO q HS, may increase to 1 PO BID if needed   No facility-administered encounter medications on file as of 09/14/2019.     Past Medical History:  Diagnosis Date  . Abnormal Pap smear of cervix 2014  . Allergy   . Arthritis   . Depression   . History of chicken pox     Past Surgical History:  Procedure Laterality Date  . CRYOTHERAPY  2014   for abnormal pap smear  . HIP SURGERY     right hip    Family History  Problem Relation Age of Onset  . Mitral valve prolapse Father   . Hypertension Mother   . Colon polyps Mother   . Lymphoma Mother   . Breast cancer Maternal Aunt 40       deceased from breast cancer  . Lung cancer Paternal Grandfather   . Mitral valve prolapse Paternal Aunt   . Breast cancer Paternal Aunt        over  26  . Lung cancer Maternal Grandfather   . Mitral valve prolapse Paternal Aunt   . Breast cancer Paternal Aunt        over 33   . Colon cancer Neg Hx     Social History   Social History Narrative   Caffeine Use-yes   Regular exercise-yes  Denies tobacco use  Review of Systems General: Denies fevers, chills, weight loss CV: Denies chest pain, shortness of breath, palpitations  Physical Exam Vitals with BMI 09/14/2019 07/12/2019 07/01/2019  Height 5\' 11"  5\' 11"  -  Weight 203 lbs 6 oz 205 lbs -  BMI 28.38 28.6 -  Systolic 136 124  Diastolic 83 70 85  Pulse 103 95 91    General:  No acute distress,  Alert and oriented, Non-Toxic, Normal speech and affect Breast: She has grade 3 ptosis.  Sternal notch to nipple distance is 31 on the right and 33 on the left.  Nipple to fold distance is 12 cm bilaterally.  I do not see any scars or masses.  I do not see any lymphadenopathy.  Assessment/Plan The patient has bilateral symptomatic macromastia.  She is a good candidate for a breast reduction.  The details of breast reduction  surgery were discussed.  I explained the procedure in detail along the with the expected scars.  The risks were discussed in detail and include bleeding, infection, damage to surrounding structures, need for additional procedures, nipple loss, change in nipple sensation, persistent pain, contour irregularities and asymmetries.  I explained that breast feeding is often not possible after breast reduction surgery.  We discussed the expected postoperative course with an overall recovery period of about 1 month.  She demonstrated full understanding of all risks.  We discussed her personal risk factors.  She has had a lot of emotional distress due to the challenges she is faced getting this approved through her insurance company.  She is hopeful that all those issues have been clarified.  I anticipate approximately 630 g of tissue removed from each side.   [name] 09/14/2019, 3:44 PM

## 2019-09-16 ENCOUNTER — Ambulatory Visit: Payer: 59 | Admitting: Psychology

## 2019-09-18 NOTE — Progress Notes (Signed)
Subjective:    Patient ID: Robyn Long, female    DOB: November 07, 1985, 34 y.o.   MRN: [id number]  HPI The patient is here for an acute visit for insomnia.   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  It started as waking up and not being able to fall back asleep for hours and now she is having nights were she does not sleep at all.  She has tried several things.  Her therapist has pushed her to come here.  She has tried melatonin, magnesium, yoga, breathing and meditation.  The supplements help her get to sleep, but she still wakes up in the middle of the night.   She gets up at the same time and goes to bed around the same time nightly.  She think she wakes up for no reason as far as she knows.     Prior to the sleep issues starting she would still wake up but could get back to sleep.  She does not feel depressed or anxious.  She is not crazy about taking medication to help her sleep, but feels tired enough that she is having some difficulty functioning.  Medications and allergies reviewed with patient and updated if appropriate.  Patient Active Problem List   Diagnosis Date Noted  . Dysuria 06/23/2019  . Right flank pain 06/23/2019  . Back pain 06/21/2019  . Neck pain 06/21/2019  . Symptomatic mammary hypertrophy 06/21/2019  . Large mass of breast 05/02/2019  . 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 Elevated blood pressure reading 12/13/2018  . Preop examination 12/13/2018  . Trigger point of right shoulder region 10/06/2018  . Right shoulder pain 06/15/2018  . Pain of left calf 12/30/2017  . Labral tear of hip joint 12/09/2017  . Nonallopathic lesion of thoracic region 07/01/2017  . Nonallopathic lesion of sacral region 07/01/2017  . Nonallopathic lesion of lumbosacral region 07/01/2017  . Acute bursitis of right shoulder 06/18/2017  . Scapular dyskinesis 06/18/2017  . Right hip pain 03/23/2017  . Constipation 04/08/2016  . Cervical intraepithelial neoplasia grade 2 02/10/2013  .  Knee pain, right anterior 01/06/2013    Current Outpatient Medications on File Prior to Visit  Medication Sig Dispense Refill  . MAGNESIUM PO Take by mouth.    . Multiple Vitamins-Minerals (WOMENS MULTIVITAMIN PLUS) TABS Take 1 tablet by mouth daily.    Marland Kitchen VITAMIN D PO Take by mouth.     No current facility-administered medications on file prior to visit.    Past Medical History:  Diagnosis Date  . Abnormal Pap smear of cervix 2014  . Allergy   . Arthritis   . Depression   . History of chicken pox     Past Surgical History:  Procedure Laterality Date  . CRYOTHERAPY  2014   for abnormal pap smear  . HIP SURGERY     right hip    Social History   Socioeconomic History  . Marital status: Married    Spouse name: Not on file  . Number of children: Not on file  . Years of education: 16+  . Highest education level: Not on file  Occupational History  . Occupation: Administrator, [employer] Service: [employer]  Tobacco Use  . Smoking status: Never Smoker  . Smokeless tobacco: Never Used  Substance and Sexual Activity  . Alcohol use: Yes    Alcohol/week: 2.0 standard drinks    Types: 2 Standard drinks or equivalent per week  . Drug use:  No  . Sexual activity: Yes    Partners: Male    Birth control/protection: Condom  Other Topics Concern  . Not on file  Social History Narrative   Caffeine Use-yes   Regular exercise-yes   Social Determinants of Health   Financial Resource Strain:   . Difficulty of Paying Living Expenses: Not on file  Food Insecurity:   . Worried About Programme researcher, broadcasting/film/video in the Last Year: Not on file  . Ran Out of Food in the Last Year: Not on file  Transportation Needs:   . Lack of Transportation (Medical): Not on file  . Lack of Transportation (Non-Medical): Not on file  Physical Activity:   . Days of Exercise per Week: Not on file  . Minutes of Exercise per Session: Not on file  Stress:   . Feeling of Stress : Not on file  Social  Connections:   . Frequency of Communication with Friends and Family: Not on file  . Frequency of Social Gatherings with Friends and Family: Not on file  . Attends Religious Services: Not on file  . Active Member of Clubs or Organizations: Not on file  . Attends Banker Meetings: Not on file  . Marital Status: Not on file    Family History  Problem Relation Age of Onset  . Mitral valve prolapse Father   . Hypertension Mother   . Colon polyps Mother   . Lymphoma Mother   . Breast cancer Maternal Aunt 40       deceased from breast cancer  . Lung cancer Paternal Grandfather   . Mitral valve prolapse Paternal Aunt   . Breast cancer Paternal Aunt        over 64  . Lung cancer Maternal Grandfather   . Mitral valve prolapse Paternal Aunt   . Breast cancer Paternal Aunt        over 66   . Colon cancer Neg Hx     Review of Systems     Objective:   Vitals:   09/19/19 0904  BP: 122/72  Pulse: 96  Resp: 16  Temp: 98.4 F (36.9 C)  SpO2: 98%   BP Readings from Last 3 Encounters:  09/19/19 122/72  09/14/19 136/83  07/12/19 124/70   Wt Readings from Last 3 Encounters:  09/19/19 204 lb 9.6 oz (92.8 kg)  09/14/19 203 lb 6.4 oz (92.3 kg)  07/12/19 205 lb (93 kg)   Body mass index is 28.54 kg/m.   Physical Exam Constitutional:      General: She is not in acute distress.    Appearance: Normal appearance. She is not ill-appearing.  HENT:     Head: Normocephalic and atraumatic.  Skin:    General: Skin is warm and dry.  Neurological:     Mental Status: She is alert.  Psychiatric:        Mood and Affect: Mood normal.        Behavior: Behavior normal.        Thought Content: Thought content normal.        Judgment: Judgment normal.            Assessment & Plan:    See Problem List for Assessment and Plan of chronic medical problems.    This visit occurred during the SARS-CoV-2 public health emergency.  Safety protocols were in place, including  screening questions prior to the visit, additional usage of staff PPE, and extensive cleaning of exam room while observing appropriate contact  time as indicated for disinfecting solutions.

## 2019-09-19 ENCOUNTER — Other Ambulatory Visit: Payer: Self-pay

## 2019-09-19 ENCOUNTER — Ambulatory Visit (INDEPENDENT_AMBULATORY_CARE_PROVIDER_SITE_OTHER): Payer: Managed Care, Other (non HMO) | Admitting: Internal Medicine

## 2019-09-19 ENCOUNTER — Encounter: Payer: Self-pay | Admitting: Internal Medicine

## 2019-09-19 DIAGNOSIS — G4709 Other insomnia: Secondary | ICD-10-CM

## 2019-09-19 DIAGNOSIS — G4700 Insomnia, unspecified: Secondary | ICD-10-CM | POA: Insufficient documentation

## 2019-09-19 MED ORDER — TRAZODONE HCL 50 MG PO TABS: 50.0000 mg | ORAL_TABLET | Freq: Every evening | ORAL | 5 refills | Status: DC | PRN

## 2019-09-19 NOTE — Assessment & Plan Note (Signed)
New problem Ongoing for more than a year Typically able to get to sleep, but waking up during the night and not able to get back to sleep She has tried over-the-counter supplements and several other things that may help, but have not She is very tired throughout the day and having difficulty functioning She denies any significant depression or anxiety She does follow with a therapist and therapist encouraged her to come and be seen Discussed medications and concerns with taking them on a prolonged basis.  Hopefully we can try something short-term and get her sleep cycle improved and then get her off medication She is thinking about becoming pregnant later this year and I advised that she will need to come off of this medication before trying to get pregnant We will try trazodone 50 mg nightly-advised her to update me via MyChart.  Can adjust dose if needed

## 2019-09-19 NOTE — Patient Instructions (Signed)
Start trazodone 50 mg nightly for sleep.  Take 30 minutes prior to bed.   Let me know how this works and we can adjust it if needed.    Insomnia Insomnia is a sleep disorder that makes it difficult to fall asleep or stay asleep. Insomnia can cause fatigue, low energy, difficulty concentrating, mood swings, and poor performance at work or school. There are three different ways to classify insomnia:  Difficulty falling asleep.  Difficulty staying asleep.  Waking up too early in the morning. Any type of insomnia can be long-term (chronic) or short-term (acute). Both are common. Short-term insomnia usually lasts for three months or less. Chronic insomnia occurs at least three times a week for longer than three months. What are the causes? Insomnia may be caused by another condition, situation, or substance, such as:  Anxiety.  Certain medicines.  Gastroesophageal reflux disease (GERD) or other gastrointestinal conditions.  Asthma or other breathing conditions.  Restless legs syndrome, sleep apnea, or other sleep disorders.  Chronic pain.  Menopause.  Stroke.  Abuse of alcohol, tobacco, or illegal drugs.  Mental health conditions, such as depression.  Caffeine.  Neurological disorders, such as Alzheimer's disease.  An overactive thyroid (hyperthyroidism). Sometimes, the cause of insomnia may not be known. What increases the risk? Risk factors for insomnia include:  Gender. Women are affected more often than men.  Age. Insomnia is more common as you get older.  Stress.  Lack of exercise.  Irregular work schedule or working night shifts.  Traveling between different time zones.  Certain medical and mental health conditions. What are the signs or symptoms? If you have insomnia, the main symptom is having trouble falling asleep or having trouble staying asleep. This may lead to other symptoms, such as:  Feeling fatigued or having low energy.  Feeling nervous  about going to sleep.  Not feeling rested in the morning.  Having trouble concentrating.  Feeling irritable, anxious, or depressed. How is this diagnosed? This condition may be diagnosed based on:  Your symptoms and medical history. Your health care provider may ask about: ? Your sleep habits. ? Any medical conditions you have. ? Your mental health.  A physical exam. How is this treated? Treatment for insomnia depends on the cause. Treatment may focus on treating an underlying condition that is causing insomnia. Treatment may also include:  Medicines to help you sleep.  Counseling or therapy.  Lifestyle adjustments to help you sleep better. Follow these instructions at home: Eating and drinking   Limit or avoid alcohol, caffeinated beverages, and cigarettes, especially close to bedtime. These can disrupt your sleep.  Do not eat a large meal or eat spicy foods right before bedtime. This can lead to digestive discomfort that can make it hard for you to sleep. Sleep habits   Keep a sleep diary to help you and your health care provider figure out what could be causing your insomnia. Write down: ? When you sleep. ? When you wake up during the night. ? How well you sleep. ? How rested you feel the next day. ? Any side effects of medicines you are taking. ? What you eat and drink.  Make your bedroom a dark, comfortable place where it is easy to fall asleep. ? Put up shades or blackout curtains to block light from outside. ? Use a white noise machine to block noise. ? Keep the temperature cool.  Limit screen use before bedtime. This includes: ? Watching TV. ? Using your smartphone,  tablet, or computer.  Stick to a routine that includes going to bed and waking up at the same times every day and night. This can help you fall asleep faster. Consider making a quiet activity, such as reading, part of your nighttime routine.  Try to avoid taking naps during the day so that you  sleep better at night.  Get out of bed if you are still awake after 15 minutes of trying to sleep. Keep the lights down, but try reading or doing a quiet activity. When you feel sleepy, go back to bed. General instructions  Take over-the-counter and prescription medicines only as told by your health care provider.  Exercise regularly, as told by your health care provider. Avoid exercise starting several hours before bedtime.  Use relaxation techniques to manage stress. Ask your health care provider to suggest some techniques that may work well for you. These may include: ? Breathing exercises. ? Routines to release muscle tension. ? Visualizing peaceful scenes.  Make sure that you drive carefully. Avoid driving if you feel very sleepy.  Keep all follow-up visits as told by your health care provider. This is important. Contact a health care provider if:  You are tired throughout the day.  You have trouble in your daily routine due to sleepiness.  You continue to have sleep problems, or your sleep problems get worse. Get help right away if:  You have serious thoughts about hurting yourself or someone else. If you ever feel like you may hurt yourself or others, or have thoughts about taking your own life, get help right away. You can go to your nearest emergency department or call:  Your local emergency services (911 in the [place]  A suicide crisis helpline, such as the National Suicide Prevention Lifeline at 856-567-8475. This is open 24 hours a day. Summary  Insomnia is a sleep disorder that makes it difficult to fall asleep or stay asleep.  Insomnia can be long-term (chronic) or short-term (acute).  Treatment for insomnia depends on the cause. Treatment may focus on treating an underlying condition that is causing insomnia.  Keep a sleep diary to help you and your health care provider figure out what could be causing your insomnia. This information is not intended to  replace advice given to you by your health care provider. Make sure you discuss any questions you have with your health care provider. Document Revised: 07/10/2017 Document Reviewed: 05/07/2017 Elsevier Patient Education  2020 [employer].

## 2019-09-23 ENCOUNTER — Ambulatory Visit (INDEPENDENT_AMBULATORY_CARE_PROVIDER_SITE_OTHER): Payer: 59 | Admitting: Psychology

## 2019-09-23 DIAGNOSIS — F332 Major depressive disorder, recurrent severe without psychotic features: Secondary | ICD-10-CM

## 2019-09-30 ENCOUNTER — Ambulatory Visit: Payer: 59 | Admitting: Psychology

## 2019-10-07 ENCOUNTER — Ambulatory Visit (INDEPENDENT_AMBULATORY_CARE_PROVIDER_SITE_OTHER): Payer: 59 | Admitting: Psychology

## 2019-10-07 DIAGNOSIS — F332 Major depressive disorder, recurrent severe without psychotic features: Secondary | ICD-10-CM

## 2019-10-14 ENCOUNTER — Ambulatory Visit: Payer: 59 | Admitting: Psychology

## 2019-10-17 ENCOUNTER — Encounter: Payer: Self-pay | Admitting: Internal Medicine

## 2019-10-18 DIAGNOSIS — M25571 Pain in right ankle and joints of right foot: Secondary | ICD-10-CM | POA: Insufficient documentation

## 2019-10-21 ENCOUNTER — Ambulatory Visit: Payer: 59 | Admitting: Psychology

## 2019-10-28 ENCOUNTER — Encounter: Payer: Self-pay | Admitting: Certified Nurse Midwife

## 2019-10-28 ENCOUNTER — Encounter: Payer: Self-pay | Admitting: Internal Medicine

## 2019-10-28 ENCOUNTER — Ambulatory Visit (INDEPENDENT_AMBULATORY_CARE_PROVIDER_SITE_OTHER): Payer: 59 | Admitting: Psychology

## 2019-10-28 DIAGNOSIS — R197 Diarrhea, unspecified: Secondary | ICD-10-CM

## 2019-10-28 DIAGNOSIS — F332 Major depressive disorder, recurrent severe without psychotic features: Secondary | ICD-10-CM

## 2019-10-28 DIAGNOSIS — R634 Abnormal weight loss: Secondary | ICD-10-CM

## 2019-10-30 MED ORDER — ESZOPICLONE 2 MG PO TABS: 2.0000 mg | ORAL_TABLET | Freq: Every evening | ORAL | 0 refills | Status: DC | PRN

## 2019-11-02 NOTE — Addendum Note (Signed)
Addended by: Pincus Sanes on: 11/02/2019 07:55 PM   Modules accepted: Orders

## 2019-11-03 ENCOUNTER — Encounter: Payer: Self-pay | Admitting: Physician Assistant

## 2019-11-04 ENCOUNTER — Ambulatory Visit: Payer: 59 | Admitting: Psychology

## 2019-11-16 ENCOUNTER — Encounter: Payer: Self-pay | Admitting: Physician Assistant

## 2019-11-16 ENCOUNTER — Ambulatory Visit (INDEPENDENT_AMBULATORY_CARE_PROVIDER_SITE_OTHER): Payer: Managed Care, Other (non HMO) | Admitting: Physician Assistant

## 2019-11-16 VITALS — BP 110/70 | HR 99 | Temp 98.9°F | Ht 71.0 in | Wt 197.0 lb

## 2019-11-16 DIAGNOSIS — G4700 Insomnia, unspecified: Secondary | ICD-10-CM

## 2019-11-16 DIAGNOSIS — R194 Change in bowel habit: Secondary | ICD-10-CM | POA: Diagnosis not present

## 2019-11-16 NOTE — Progress Notes (Signed)
Assessment and plan noted ?

## 2019-11-16 NOTE — Progress Notes (Signed)
Chief Complaint: Change in bowel habits  HPI:    Robyn Long is a 34 year old Caucasian female with a past medical history as listed below, previously known to Dr. Marina Goodell for some constipation in 2017, who was referred to me by Pincus Sanes, MD for a complaint of change in bowel habits.      Today, the patient presents clinic and tells me that she has been diagnosed with insomnia telling me that she barely gets maybe 3 hours of sleep at night and this has been happening for 6+ months at this time.  She is seeing her PCP in regards to this and they have tried Trazodone as well as Lunesta which does not help.  Patient tells me that she will go to sleep okay but then wakes up around 1 in the morning or so and just is "wide-awake".  At that point she starts getting anxious because she knows she should be sleeping.      Along with this has noticed a change in her bowel habits telling me that she will have occasional episodes of diarrhea.  Tells me that may be 2-3 times a week her normal daily stool will be liquid and associated with some abdominal discomfort prior which is relieved afterwards.  On the other days she will have a "slightly softer than normal but still formed" stool.  Tells me that she initially lost about 10 pounds in 4 weeks, but is now slowly gaining this back.  She has stopped drinking caffeine and alcohol to see if this will help with her insomnia.    Denies fever, chills, blood in her stool, more than about 1 bowel movement per day or upper GI symptoms.  Past Medical History:  Diagnosis Date  . Abnormal Pap smear of cervix 2014  . Allergy   . Arthritis   . Depression   . History of chicken pox     Past Surgical History:  Procedure Laterality Date  . CRYOTHERAPY  2014   for abnormal pap smear  . HIP SURGERY     right hip    Current Outpatient Medications  Medication Sig Dispense Refill  . eszopiclone (LUNESTA) 2 MG TABS tablet Take 1 tablet (2 mg total) by mouth at  bedtime as needed for sleep. Take immediately before bedtime 30 tablet 0  . MAGNESIUM PO Take by mouth.    . Multiple Vitamins-Minerals (WOMENS MULTIVITAMIN PLUS) TABS Take 1 tablet by mouth daily.    Marland Kitchen VITAMIN D PO Take by mouth.     No current facility-administered medications for this visit.    Allergies as of 11/16/2019  . (No Known Allergies)    Family History  Problem Relation Age of Onset  . Mitral valve prolapse Father   . Hypertension Mother   . Colon polyps Mother   . Lymphoma Mother   . Breast cancer Maternal Aunt 40       deceased from breast cancer  . Lung cancer Paternal Grandfather   . Mitral valve prolapse Paternal Aunt   . Breast cancer Paternal Aunt        over 66  . Lung cancer Maternal Grandfather   . Mitral valve prolapse Paternal Aunt   . Breast cancer Paternal Aunt        over 63   . Colon cancer Neg Hx     Social History   Socioeconomic History  . Marital status: Married    Spouse name: Not on file  . Number of  children: Not on file  . Years of education: 16+  . Highest education level: Not on file  Occupational History  . Occupation: Administrator, [employer] Service: [employer]  Tobacco Use  . Smoking status: Never Smoker  . Smokeless tobacco: Never Used  Substance and Sexual Activity  . Alcohol use: Yes    Alcohol/week: 2.0 standard drinks    Types: 2 Standard drinks or equivalent per week  . Drug use: No  . Sexual activity: Yes    Partners: Male    Birth control/protection: Condom  Other Topics Concern  . Not on file  Social History Narrative   Caffeine Use-yes   Regular exercise-yes   Social Determinants of Health   Financial Resource Strain:   . Difficulty of Paying Living Expenses:   Food Insecurity:   . Worried About Charity fundraiser in the Last Year:   . Arboriculturist in the Last Year:   Transportation Needs:   . Film/video editor (Medical):   Marland Kitchen Lack of Transportation (Non-Medical):   Physical  Activity:   . Days of Exercise per Week:   . Minutes of Exercise per Session:   Stress:   . Feeling of Stress :   Social Connections:   . Frequency of Communication with Friends and Family:   . Frequency of Social Gatherings with Friends and Family:   . Attends Religious Services:   . Active Member of Clubs or Organizations:   . Attends Archivist Meetings:   Marland Kitchen Marital Status:   Intimate Partner Violence:   . Fear of Current or Ex-Partner:   . Emotionally Abused:   Marland Kitchen Physically Abused:   . Sexually Abused:     Review of Systems:    Constitutional: No weight loss, fever or chills Skin: No rash Cardiovascular: No chest pain Respiratory: No SOB  Gastrointestinal: See HPI and otherwise negative Genitourinary: No dysuria Neurological: No headache, dizziness or syncope Musculoskeletal: No new muscle or joint pain Hematologic: No bleeding  Psychiatric: No history of depression or anxiety   Physical Exam:  Vital signs: BP 110/70   Pulse 99   Temp 98.9 F (37.2 C)   Ht 5\' 11"  (1.803 m)   Wt 197 lb (89.4 kg)   BMI 27.48 kg/m   Constitutional:   Pleasant Caucasian female appears to be in NAD, Well developed, Well nourished, alert and cooperative Head:  Normocephalic and atraumatic. Eyes:   PEERL, EOMI. No icterus. Conjunctiva pink. Ears:  Normal auditory acuity. Neck:  Supple Throat: Oral cavity and pharynx without inflammation, swelling or lesion.  Respiratory: Respirations even and unlabored. Lungs clear to auscultation bilaterally.   No wheezes, crackles, or rhonchi.  Cardiovascular: Normal S1, S2. No MRG. Regular rate and rhythm. No peripheral edema, cyanosis or pallor.  Gastrointestinal:  Soft, nondistended, nontender. No rebound or guarding. Normal bowel sounds. No appreciable masses or hepatomegaly. Rectal:  Not performed.  Msk:  Symmetrical without gross deformities. Without edema, no deformity or joint abnormality.  Neurologic:  Alert and  oriented x4;   grossly normal neurologically.  Skin:   Dry and intact without significant lesions or rashes. Psychiatric: Demonstrates good judgement and reason without abnormal affect or behaviors.  MOST RECENT LABS AND IMAGING: CBC    Component Value Date/Time   WBC 6.3 07/01/2019 1616   RBC 4.14 07/01/2019 1616   HGB 12.8 07/01/2019 1616   HGB 13.3 05/17/2019 1206   HCT 41.9 07/01/2019 1616   HCT  39.7 05/17/2019 1206   PLT 186 07/01/2019 1616   PLT 296 05/17/2019 1206   MCV 101.2 (H) 07/01/2019 1616   MCV 94 05/17/2019 1206   MCH 30.9 07/01/2019 1616   MCHC 30.5 07/01/2019 1616   RDW 12.8 07/01/2019 1616   RDW 12.3 05/17/2019 1206   LYMPHSABS 1.6 07/14/2017 1722   MONOABS 0.5 07/14/2017 1722   EOSABS 0.1 07/14/2017 1722   BASOSABS 0.0 07/14/2017 1722    CMP     Component Value Date/Time   NA 136 07/01/2019 1616   NA 139 05/17/2019 1206   K 4.8 07/01/2019 1616   CL 105 07/01/2019 1616   CO2 22 07/01/2019 1616   GLUCOSE 132 (H) 07/01/2019 1616   BUN 14 07/01/2019 1616   BUN 15 05/17/2019 1206   CREATININE 0.95 07/01/2019 1616   CALCIUM 8.8 (L) 07/01/2019 1616   PROT 7.5 07/01/2019 1616   PROT 7.4 05/17/2019 1206   ALBUMIN 4.4 07/01/2019 1616   ALBUMIN 4.7 05/17/2019 1206   AST 31 07/01/2019 1616   ALT 10 07/01/2019 1616   ALKPHOS 67 07/01/2019 1616   BILITOT 0.8 07/01/2019 1616   BILITOT 0.3 05/17/2019 1206   GFRNONAA >60 07/01/2019 1616   GFRAA >60 07/01/2019 1616    Assessment: 1.  Change in bowel habits: Towards diarrhea and some abdominal discomfort over the past 3 to 4 weeks; consider relation to IBS with increased anxiety from not sleeping most likely 2.  Insomnia: Uncertain etiology, working with PCP to help with this  Plan: 1.  Discussed with the patient that I do not feel as though her insomnia is being caused by any GI symptoms.  Rather she probably has a little bit of irritable bowel acting up from increased anxiety during the day after not being able to  sleep. 2.  Discussed medication such as antispasmodics and other, but patient symptoms are really not at a point that she needs these medicines.  She would like to try other measures first. 3.  Recommend the patient start a fiber supplement such as Metamucil, Citrucel or Benefiber.  The goal is 25-35 g of fiber in her diet per day through a supplement and/or her diet. 4.  Recommend the patient start Align daily probiotic once daily for the next 2 months. 5.  Discussed with the patient that she should contact her clinic if symptoms become more frequent or she has more abdominal pain.  At that time could discuss further imaging/procedures/treatment.  Robyn Meeker, PA-C [facility] 11/16/2019, 11:40 AM  Cc: Pincus Sanes, MD

## 2019-11-16 NOTE — Patient Instructions (Signed)
If you are age 35 or older, your body mass index should be between 23-30. Your Body mass index is 27.48 kg/m. If this is out of the aforementioned range listed, please consider follow up with your Primary Care Provider.  If you are age 23 or younger, your body mass index should be between 19-25. Your Body mass index is 27.48 kg/m. If this is out of the aformentioned range listed, please consider follow up with your Primary Care Provider.   Start Align Probiotic daily.  Start daily fiber supplement (Metemucil, Benefiber, Citrucel).

## 2019-11-17 ENCOUNTER — Encounter: Payer: Self-pay | Admitting: Internal Medicine

## 2019-11-17 NOTE — Progress Notes (Signed)
Subjective:    Patient ID: Robyn Long, female    DOB: October 07, 1985, 34 y.o.   MRN: [id number]  HPI She is here for pre-operative clearance at the request of Dr Caswell Corwin for left hip arthroscopy scheduled for 12/07/2019.   She denies any personal or family history of problems with anesthesia or bleeding/blood clot problems.    She has no concerns. She is on vitamins and a sleep medication.     She is exercising regularly.  With her daily activities and exercise she denies chest pain, palpitations, SOB.     She is still experiencing insomnia.  She only gets 3-3 1/2 hrs of sleep w/o medication and it is broken up sleep.  The lunesta helped give her two hours of sleep.  trazodone did not work.  She typically falls asleep ok, but does not stay asleep.    Medications and allergies reviewed with patient and updated if appropriate.  Patient Active Problem List   Diagnosis Date Noted  . Insomnia 09/19/2019  . Back pain 06/21/2019  . Neck pain 06/21/2019  . Large mass of breast 05/02/2019  . 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 Elevated blood pressure reading 12/13/2018  . Preop examination 12/13/2018  . Trigger point of right shoulder region 10/06/2018  . Right shoulder pain 06/15/2018  . Pain of left calf 12/30/2017  . Labral tear of hip joint 12/09/2017  . Nonallopathic lesion of thoracic region 07/01/2017  . Nonallopathic lesion of sacral region 07/01/2017  . Nonallopathic lesion of lumbosacral region 07/01/2017  . Acute bursitis of right shoulder 06/18/2017  . Scapular dyskinesis 06/18/2017  . Right hip pain 03/23/2017  . Constipation 04/08/2016  . Cervical intraepithelial neoplasia grade 2 02/10/2013  . Knee pain, right anterior 01/06/2013    Current Outpatient Medications on File Prior to Visit  Medication Sig Dispense Refill  . MAGNESIUM PO Take by mouth.    . Multiple Vitamins-Minerals (WOMENS MULTIVITAMIN PLUS) TABS Take 1 tablet by mouth daily.    Marland Kitchen VITAMIN  D PO Take by mouth.     No current facility-administered medications on file prior to visit.    Past Medical History:  Diagnosis Date  . Abnormal Pap smear of cervix 2014  . Allergy   . Arthritis   . Depression   . History of chicken pox     Past Surgical History:  Procedure Laterality Date  . CRYOTHERAPY  2014   for abnormal pap smear  . HIP SURGERY     right hip    Social History   Socioeconomic History  . Marital status: Married    Spouse name: Not on file  . Number of children: Not on file  . Years of education: 16+  . Highest education level: Not on file  Occupational History  . Occupation: Associate Professor: [employer]  Tobacco Use  . Smoking status: Never Smoker  . Smokeless tobacco: Never Used  Substance and Sexual Activity  . Alcohol use: Yes    Alcohol/week: 2.0 standard drinks    Types: 2 Standard drinks or equivalent per week  . Drug use: No  . Sexual activity: Yes    Partners: Male    Birth control/protection: Condom  Other Topics Concern  . Not on file  Social History Narrative   Caffeine Use-yes   Regular exercise-yes   Social Determinants of Health   Financial Resource Strain:   . Difficulty of Paying Living Expenses:   Food  Insecurity:   . Worried About Programme researcher, broadcasting/film/video in the Last Year:   . Barista in the Last Year:   Transportation Needs:   . Freight forwarder (Medical):   Marland Kitchen Lack of Transportation (Non-Medical):   Physical Activity:   . Days of Exercise per Week:   . Minutes of Exercise per Session:   Stress:   . Feeling of Stress :   Social Connections:   . Frequency of Communication with Friends and Family:   . Frequency of Social Gatherings with Friends and Family:   . Attends Religious Services:   . Active Member of Clubs or Organizations:   . Attends Banker Meetings:   Marland Kitchen Marital Status:     Family History  Problem Relation Age of Onset  . Mitral valve prolapse  Father   . Hypertension Mother   . Colon polyps Mother   . Lymphoma Mother   . Breast cancer Maternal Aunt 40       deceased from breast cancer  . Lung cancer Paternal Grandfather   . Mitral valve prolapse Paternal Aunt   . Breast cancer Paternal Aunt        over 87  . Lung cancer Maternal Grandfather   . Mitral valve prolapse Paternal Aunt   . Breast cancer Paternal Aunt        over 55   . Colon cancer Neg Hx     Review of Systems  Constitutional: Negative for chills and fever.  HENT: Negative for congestion and sinus pain.   Eyes: Negative for visual disturbance.  Respiratory: Negative for cough, shortness of breath and wheezing.   Cardiovascular: Negative for chest pain, palpitations and leg swelling.  Gastrointestinal: Positive for diarrhea (once a day about 3 times a week). Negative for abdominal pain, blood in stool, constipation and nausea.       No gerd  Genitourinary: Positive for hematuria. Negative for dysuria.  Musculoskeletal: Negative for back pain and myalgias.  Skin: Negative for rash.  Neurological: Positive for dizziness, light-headedness and headaches (chronic). Negative for weakness and numbness.  Psychiatric/Behavioral: Positive for sleep disturbance.       Objective:   Vitals:   11/18/19 1122  BP: 138/80  Pulse: 79  Temp: 98.2 F (36.8 C)  SpO2: 98%   Filed Weights   11/18/19 1122  Weight: 196 lb (88.9 kg)   Body mass index is 27.34 kg/m.  BP Readings from Last 3 Encounters:  11/18/19 138/80  11/16/19 110/70  09/19/19 122/72    Wt Readings from Last 3 Encounters:  11/18/19 196 lb (88.9 kg)  11/16/19 197 lb (89.4 kg)  09/19/19 204 lb 9.6 oz (92.8 kg)     Physical Exam Constitutional: She appears well-developed and well-nourished. No distress.  HENT:  Head: Normocephalic and atraumatic.  Right Ear: External ear normal. Normal ear canal and TM Left Ear: External ear normal.  Normal ear canal and TM Mouth/Throat: Oropharynx is  clear and moist.  Eyes: Conjunctivae and EOM are normal.  Neck: Neck supple. No tracheal deviation present. No thyromegaly present.  No carotid bruit  Cardiovascular: Normal rate, regular rhythm and normal heart sounds.   No murmur heard.  No edema. Pulmonary/Chest: Effort normal and breath sounds normal. No respiratory distress. She has no wheezes. She has no rales.    Abdominal: Soft. She exhibits no distension. There is no tenderness.  Lymphadenopathy: She has no cervical adenopathy.  Skin: Skin is warm and dry. She  is not diaphoretic.  Psychiatric: She has a normal mood and affect. Her behavior is normal.        Assessment & Plan:     See Problem List for Assessment and Plan of chronic medical problems.    This visit occurred during the SARS-CoV-2 public health emergency.  Safety protocols were in place, including screening questions prior to the visit, additional usage of staff PPE, and extensive cleaning of exam room while observing appropriate contact time as indicated for disinfecting solutions.

## 2019-11-18 ENCOUNTER — Encounter: Payer: Self-pay | Admitting: Internal Medicine

## 2019-11-18 ENCOUNTER — Ambulatory Visit (INDEPENDENT_AMBULATORY_CARE_PROVIDER_SITE_OTHER): Payer: Managed Care, Other (non HMO) | Admitting: Internal Medicine

## 2019-11-18 ENCOUNTER — Ambulatory Visit (INDEPENDENT_AMBULATORY_CARE_PROVIDER_SITE_OTHER): Payer: 59 | Admitting: Psychology

## 2019-11-18 ENCOUNTER — Other Ambulatory Visit: Payer: Self-pay

## 2019-11-18 VITALS — BP 138/80 | HR 79 | Temp 98.2°F | Ht 71.0 in | Wt 196.0 lb

## 2019-11-18 DIAGNOSIS — Z01818 Encounter for other preprocedural examination: Secondary | ICD-10-CM | POA: Diagnosis not present

## 2019-11-18 DIAGNOSIS — G4700 Insomnia, unspecified: Secondary | ICD-10-CM

## 2019-11-18 DIAGNOSIS — F332 Major depressive disorder, recurrent severe without psychotic features: Secondary | ICD-10-CM | POA: Diagnosis not present

## 2019-11-18 MED ORDER — ZOLPIDEM TARTRATE ER 6.25 MG PO TBCR: 6.2500 mg | EXTENDED_RELEASE_TABLET | Freq: Every evening | ORAL | 0 refills | Status: DC | PRN

## 2019-11-18 NOTE — Patient Instructions (Signed)
We will send a letter to surgery.     Medications reviewed and updated.  Changes include :   ambien CR 6.25 mg - try two pills if 1 pill does not work after a couple of nights.  Your prescription(s) have been submitted to your pharmacy. Please take as directed and contact our office if you believe you are having problem(s) with the medication(s).    A referral was ordered for neurology.       Someone will call you to schedule this.

## 2019-11-18 NOTE — Assessment & Plan Note (Addendum)
Chronic Getting about 3-3:30 hrs at night - broken sleep Melatonin, Trazodone and lunesta have not been effective Working with psychology  - no underlying anxiety or depression Will refer to neuro  Trial of ambien cr 6.25 nightly - if this does not work after 2 nights I have advised her to try two pills Update me via [employer]

## 2019-11-18 NOTE — Assessment & Plan Note (Signed)
Here for preop medical clearance for left hip arthroscopy to be done on 4/28 She is low risk for low risk procedure No history of CAD, lung disease and no symptoms concerning for either No further evaluation / testing is necessary Will send letter to surgery - dr Caswell Corwin re: clearance

## 2019-11-24 ENCOUNTER — Telehealth: Payer: Self-pay | Admitting: Internal Medicine

## 2019-11-24 NOTE — Telephone Encounter (Signed)
Letter faxed.

## 2019-11-24 NOTE — Telephone Encounter (Signed)
  Pre op appointment was 11/18/19 Patient requesting surgical clearance letter be sent to Dr Caswell Corwin at [facility] [phone] Email:  [email or web address]

## 2019-11-25 ENCOUNTER — Ambulatory Visit: Payer: 59 | Admitting: Psychology

## 2019-11-29 ENCOUNTER — Telehealth: Payer: Self-pay | Admitting: Plastic Surgery

## 2019-11-29 NOTE — Telephone Encounter (Signed)
Call placed to [employer] to request an update on the surgery appeal that was faxed with confirmation on 09/30/2019. Previous call on 10/28/2019 - spoke with Mabeline Caras and confirmed receipt and a final decision would be made by 11/10/2019 - call reference number [id number]. On 11/29/2019, I spoke with Enid Derry, call reference number [id number]. Enid Derry indicated that he was having a very difficult time locating any documentation. After holding for over 20 minutes, Enid Derry said that the appeals department advised to tell me that they were upholding the denial as a non-covered benefit on the policy. Enid Derry did not have any additional information and could not advise when the decision was made or if correspondence was being sent to the office and patient regarding the decision. He confirmed the call is recorded, per my request, and that the information would be on the member record. I informed him that I would pass this information along to the patient.   I called Ms. Mikelson and left a voicemail with the information stated above. I advised her to contact her insurance representative and discuss her benefits. She can reference previous correspondence that indicates how to make an inquiry to the department of insurance for assistance on further resolution, since [employer] seems to be relaying conflicting information to the patient and the provider.

## 2019-12-02 ENCOUNTER — Ambulatory Visit: Payer: 59 | Admitting: Psychology

## 2019-12-05 ENCOUNTER — Encounter: Payer: Self-pay | Admitting: Neurology

## 2019-12-05 ENCOUNTER — Ambulatory Visit (INDEPENDENT_AMBULATORY_CARE_PROVIDER_SITE_OTHER): Payer: Managed Care, Other (non HMO) | Admitting: Neurology

## 2019-12-05 ENCOUNTER — Ambulatory Visit (INDEPENDENT_AMBULATORY_CARE_PROVIDER_SITE_OTHER): Payer: 59 | Admitting: Psychology

## 2019-12-05 ENCOUNTER — Other Ambulatory Visit: Payer: Self-pay

## 2019-12-05 VITALS — BP 132/84 | HR 109 | Temp 98.1°F | Ht 71.0 in | Wt 199.3 lb

## 2019-12-05 DIAGNOSIS — E663 Overweight: Secondary | ICD-10-CM

## 2019-12-05 DIAGNOSIS — R0683 Snoring: Secondary | ICD-10-CM

## 2019-12-05 DIAGNOSIS — Z72820 Sleep deprivation: Secondary | ICD-10-CM

## 2019-12-05 DIAGNOSIS — F332 Major depressive disorder, recurrent severe without psychotic features: Secondary | ICD-10-CM | POA: Diagnosis not present

## 2019-12-05 DIAGNOSIS — R519 Headache, unspecified: Secondary | ICD-10-CM | POA: Diagnosis not present

## 2019-12-05 DIAGNOSIS — G479 Sleep disorder, unspecified: Secondary | ICD-10-CM | POA: Diagnosis not present

## 2019-12-05 DIAGNOSIS — G4700 Insomnia, unspecified: Secondary | ICD-10-CM

## 2019-12-05 NOTE — Patient Instructions (Signed)
Based on your symptoms and your exam I believe we should look for an underlying organic sleep disorder, such as obstructive sleep apnea (OSA) with a sleep study.   If you have more than mild OSA, I want you to consider treatment with CPAP. Please remember, the risks and ramifications of moderate to severe obstructive sleep apnea or OSA are: Cardiovascular disease, including congestive heart failure, stroke, difficult to control hypertension, arrhythmias, and even type 2 diabetes has been linked to untreated OSA. Sleep apnea causes disruption of sleep and sleep deprivation in most cases, which, in turn, can cause recurrent headaches, problems with memory, mood, concentration, focus, and vigilance. Most people with untreated sleep apnea report excessive daytime sleepiness, which can affect their ability to drive. Please do not drive if you feel sleepy.   We will call you after your sleep study to advise about the results (most likely, you will hear from [facility], my nurse).    Our sleep lab administrative assistant, will call you to schedule your sleep study. If you don't hear back from her by about 2 weeks from now, please feel free to call her at [phone]. This is her direct line and please leave a message with your phone number to call back if you get the voicemail box. She will call back as soon as possible.   Your chronic insomnia may respond to cognitive behavioral therapy (CBT-I). Please talk to your therapist about it.  There are a few other medications for insomnia. Please talk to Dr. Lawerance Bach about medication options.

## 2019-12-05 NOTE — Progress Notes (Signed)
Subjective:    Patient ID: Robyn Long is a 34 y.o. female.  HPI     Robyn Foley, MD, PhD [facility] [address] [address] [place], [place] [address]  Dear Dr. Lawerance Bach,   I saw your patient, Robyn Long, Upon your kind request to my sleep clinic today for initial consultation of her sleep disturbance, in particular difficulty maintaining sleep for over 1 year.  The patient is unaccompanied today.  As you know, [name] is a 34 year old right-handed woman with an underlying medical history of neck pain, back pain, arthritis, allergies and mildly overweight state, who reports a longstanding history of difficulty with her sleep since high school years, more pronounced over the past year with difficulty maintaining sleep nearly nightly.  She has tried over-the-counter medications including Unisom and melatonin without success.  She has tried prescription medication including trazodone and Lunesta which did not help, currently on Ambien CR 6.25 mg strength 1 to 2 pills as needed.  She took 2 pills last night.  She does not find it very effective either.  She does not take it every night.  She has tried improving her sleep hygiene and tries to keep a set schedule for her bedtime and rise time.  She has talked to a therapist because of symptoms of anxiety and depression and was encouraged to follow-up with you for insomnia.  I reviewed your office note from 09/19/2019.  Her Epworth sleepiness score is 3 out of 24, fatigue severity score is 46 out of 63.  Her father has difficulty sleeping.  There is no family history of OSA.  She goes to bed around 10 and rise time is around 7.  She teaches at [employer].  She is a non-smoker and drinks alcohol occasionally, maybe twice a week and caffeine and limitation, up to 1 cup/day on average. She typically falls asleep within half an hour but wakes up after 2 to 3 hours and has a significant difficulty falling back  to sleep, may doze off for about half an hour in the early morning hours.  She lives with her husband.  She snores mildly and intermittently, denies any telltale symptoms of restless leg syndrome or leg twitching at night.  She has no night to night nocturia but has woken up occasionally with a headache.  Her Past Medical History Is Significant For: Past Medical History:  Diagnosis Date  . Abnormal Pap smear of cervix 2014  . Allergy   . Arthritis   . Depression   . History of chicken pox     Her Past Surgical History Is Significant For: Past Surgical History:  Procedure Laterality Date  . CRYOTHERAPY  2014   for abnormal pap smear  . HIP SURGERY     right hip    Her Family History Is Significant For: Family History  Problem Relation Age of Onset  . Mitral valve prolapse Father   . Hypertension Mother   . Colon polyps Mother   . Lymphoma Mother   . Breast cancer Maternal Aunt 40       deceased from breast cancer  . Lung cancer Paternal Grandfather   . Mitral valve prolapse Paternal Aunt   . Breast cancer Paternal Aunt        over 61  . Lung cancer Maternal Grandfather   . Mitral valve prolapse Paternal Aunt   . Breast cancer Paternal Aunt        over 68   .  Colon cancer Neg Hx     Her Social History Is Significant For: Social History   Socioeconomic History  . Marital status: Married    Spouse name: Not on file  . Number of children: Not on file  . Years of education: 16+  . Highest education level: Not on file  Occupational History  . Occupation: Administrator, [employer] Service: [employer]  Tobacco Use  . Smoking status: Never Smoker  . Smokeless tobacco: Never Used  Substance and Sexual Activity  . Alcohol use: Yes    Alcohol/week: 2.0 standard drinks    Types: 2 Standard drinks or equivalent per week  . Drug use: No  . Sexual activity: Yes    Partners: Male    Birth control/protection: Condom  Other Topics Concern  . Not on file  Social  History Narrative   Caffeine Use-yes   Regular exercise-yes   Social Determinants of Health   Financial Resource Strain:   . Difficulty of Paying Living Expenses:   Food Insecurity:   . Worried About Charity fundraiser in the Last Year:   . Arboriculturist in the Last Year:   Transportation Needs:   . Film/video editor (Medical):   Marland Kitchen Lack of Transportation (Non-Medical):   Physical Activity:   . Days of Exercise per Week:   . Minutes of Exercise per Session:   Stress:   . Feeling of Stress :   Social Connections:   . Frequency of Communication with Friends and Family:   . Frequency of Social Gatherings with Friends and Family:   . Attends Religious Services:   . Active Member of Clubs or Organizations:   . Attends Archivist Meetings:   Marland Kitchen Marital Status:     Her Allergies Are:  No Known Allergies:   Her Current Medications Are:  Outpatient Encounter Medications as of 12/05/2019  Medication Sig  . MAGNESIUM PO Take by mouth.  . Multiple Vitamins-Minerals (WOMENS MULTIVITAMIN PLUS) TABS Take 1 tablet by mouth daily.  Marland Kitchen VITAMIN D PO Take by mouth.  . [DISCONTINUED] zolpidem (AMBIEN CR) 6.25 MG CR tablet Take 1 tablet (6.25 mg total) by mouth at bedtime as needed for sleep.   No facility-administered encounter medications on file as of 12/05/2019.  :  Review of Systems:  Out of a complete 14 point review of systems, all are reviewed and negative with the exception of these symptoms as listed below: Review of Systems  Neurological:       Here for sleep consult. No prior sleep study. Pt reports 3-4 hours of sleep at night and daytime sleepiness. Has tried sleep aids ( Ambien and trazodone) but meds have not helped.   Epworth Sleepiness Scale 0= would never doze 1= slight chance of dozing 2= moderate chance of dozing 3= high chance of dozing  Sitting and reading:1 Watching TV:1 Sitting inactive in a public place (ex. Theater or meeting):0 As a passenger  in a car for an hour without a break:1 Lying down to rest in the afternoon:0 Sitting and talking to someone:0 Sitting quietly after lunch (no alcohol):0 In a car, while stopped in traffic:0 Total:3     Objective:  Neurological Exam  Physical Exam Physical Examination:   Vitals:   12/05/19 1115  BP: 132/84  Pulse: (!) 109  Temp: 98.1 F (36.7 C)    General Examination: The patient is a very pleasant 34 y.o. female in no acute distress. She appears  well-developed and well-nourished and well groomed.   HEENT: Normocephalic, atraumatic, pupils are equal, round and reactive to light, extraocular tracking is good without limitation to gaze excursion or nystagmus noted. Hearing is grossly intact. Face is symmetric with normal facial animation. Speech is clear with no dysarthria noted. There is no hypophonia. There is no lip, neck/head, jaw or voice tremor. Neck is supple with full range of passive and active motion. There are no carotid bruits on auscultation. Oropharynx exam reveals: no significant mouth dryness, good dental hygiene and mild airway crowding, due to longer uvula, tonsils of 1-2+. Mallampati is class II. Tongue protrudes centrally and palate elevates symmetrically. Neck size is 13.75 inches.  Chest: Clear to auscultation without wheezing, rhonchi or crackles noted.  Heart: S1+S2+0, regular and normal without murmurs, rubs or gallops noted.   Abdomen: Soft, non-tender and non-distended with normal bowel sounds appreciated on auscultation.  Extremities: There is no pitting edema in the distal lower extremities bilaterally.   Skin: Warm and dry without trophic changes noted.   Musculoskeletal: exam reveals no obvious joint deformities, tenderness or joint swelling or erythema.   Neurologically:  Mental status: The patient is awake, alert and oriented in all 4 spheres. Her immediate and remote memory, attention, language skills and fund of knowledge are appropriate. There  is no evidence of aphasia, agnosia, apraxia or anomia. Speech is clear with normal prosody and enunciation. Thought process is linear. Mood is normal and affect is normal.  Cranial nerves II - XII are as described above under HEENT exam.  Motor exam: Normal bulk, strength and tone is noted. There is no tremor, Romberg is negative. Fine motor skills and coordination: grossly intact.  Cerebellar testing: No dysmetria or intention tremor. There is no truncal or gait ataxia.  Sensory exam: intact to light touch in the upper and lower extremities.  Gait, station and balance: She stands easily. No veering to one side is noted. No leaning to one side is noted. Posture is age-appropriate and stance is narrow based. Gait shows normal stride length and normal pace. No problems turning are noted. Tandem walk is unremarkable.                Assessment and Plan:   In summary, Lakesa Coste is a very pleasant 34 y.o.-year old female with an underlying medical history of neck pain, back pain, arthritis, allergies and mildly overweight state, who presents for evaluation of her sleep disorder, in particular her difficulty maintaining sleep.  She has had trouble for years but not as pronounced as now.  For the past year she has significant perpetual sleep deprivation and has struggled with chronic difficulty maintaining sleep.  While her history is not telltale for restless leg syndrome or underlying obstructive sleep disordered breathing, I would like to proceed with a laboratory sleep study to rule out an underlying organic cause of her symptoms.  She is encouraged to talk to her therapist about cognitive behavioral therapy.  She has tried and failed over-the-counter sleep aids and prescription sleep aids tried have been trazodone and Lunesta and currently she is on long-acting Ambien generic.  She has not yet tried Belsomra or Rozerem.  She is encouraged to talk to you about potential alternative medication options.  We  will proceed with sleep study testing and keep her posted as to the results.  If she has an organic sleep disorder that may contribute to her chronic sleep difficulties, she will likely benefit from treatment.  We talked  about sleep apnea and its treatment options.  We will proceed with testing and take it from there.  She is advised to call our office should she have any interim questions or concerns.  We will keep her posted as to her test results by phone call and also follow-up in clinic if needed afterwards.  I answered all her questions today and she was in agreement.  Thank you very much for allowing me to participate in the care of this nice patient. If I can be of any further assistance to you please do not hesitate to call me at [phone].  Sincerely,   Robyn Foley, MD, PhD

## 2019-12-07 ENCOUNTER — Telehealth: Payer: Self-pay

## 2019-12-07 NOTE — Telephone Encounter (Signed)
LVM for pt to call me back to schedule sleep study  

## 2019-12-09 ENCOUNTER — Ambulatory Visit: Payer: 59 | Admitting: Psychology

## 2019-12-15 ENCOUNTER — Telehealth: Payer: Self-pay

## 2019-12-15 NOTE — Telephone Encounter (Signed)
2nd LVM for HST

## 2019-12-16 ENCOUNTER — Ambulatory Visit: Payer: 59 | Admitting: Psychology

## 2019-12-20 ENCOUNTER — Telehealth: Payer: Self-pay

## 2019-12-20 NOTE — Telephone Encounter (Signed)
Scheduled HST

## 2019-12-30 ENCOUNTER — Ambulatory Visit (INDEPENDENT_AMBULATORY_CARE_PROVIDER_SITE_OTHER): Payer: 59 | Admitting: Psychology

## 2019-12-30 DIAGNOSIS — F332 Major depressive disorder, recurrent severe without psychotic features: Secondary | ICD-10-CM | POA: Diagnosis not present

## 2020-01-06 ENCOUNTER — Ambulatory Visit: Payer: 59 | Admitting: Psychology

## 2020-01-11 ENCOUNTER — Ambulatory Visit (INDEPENDENT_AMBULATORY_CARE_PROVIDER_SITE_OTHER): Payer: Managed Care, Other (non HMO) | Admitting: Neurology

## 2020-01-11 ENCOUNTER — Other Ambulatory Visit: Payer: Self-pay

## 2020-01-11 DIAGNOSIS — Z72820 Sleep deprivation: Secondary | ICD-10-CM

## 2020-01-11 DIAGNOSIS — G479 Sleep disorder, unspecified: Secondary | ICD-10-CM

## 2020-01-11 DIAGNOSIS — R0683 Snoring: Secondary | ICD-10-CM

## 2020-01-11 DIAGNOSIS — G4710 Hypersomnia, unspecified: Secondary | ICD-10-CM

## 2020-01-11 DIAGNOSIS — R519 Headache, unspecified: Secondary | ICD-10-CM

## 2020-01-11 DIAGNOSIS — E663 Overweight: Secondary | ICD-10-CM

## 2020-01-11 DIAGNOSIS — G4700 Insomnia, unspecified: Secondary | ICD-10-CM

## 2020-01-13 ENCOUNTER — Ambulatory Visit: Payer: 59 | Admitting: Psychology

## 2020-01-20 ENCOUNTER — Ambulatory Visit (INDEPENDENT_AMBULATORY_CARE_PROVIDER_SITE_OTHER): Payer: 59 | Admitting: Psychology

## 2020-01-20 DIAGNOSIS — F332 Major depressive disorder, recurrent severe without psychotic features: Secondary | ICD-10-CM

## 2020-01-20 DIAGNOSIS — Z719 Counseling, unspecified: Secondary | ICD-10-CM

## 2020-01-27 ENCOUNTER — Ambulatory Visit: Payer: 59 | Admitting: Psychology

## 2020-01-31 NOTE — Procedures (Signed)
    Patient Information     First Name: Robyn Last Name: Long ID: [id number]  Birth Date: 31-May-1986 Age: 34 Gender: Female  Referring Provider: Pincus Sanes, MD BMI: 27.8 (W=198 lb, H=5' 11'')  Neck Circ.:  14 '' Epworth:  3/24   Sleep Study Information    Study Date: Jan 11, 2020 S/H/A Version: [id number] / 4.1.1528 / 25  History:    34 year old right-handed woman with an underlying medical history of neck pain, back pain, arthritis, allergies and mildly overweight state, who reports a longstanding history of difficulty with her sleep since high school years.  Summary & Diagnosis:    Disorder of initiating and maintaining sleep  Recommendations:      This home sleep test does not demonstrate any significant obstructive or central sleep disordered breathing with a total AHI of 1.7/hour and O2 nadir of 92%. No significant snoring was noted. Other causes of the patient's symptoms, including circadian rhythm disturbances, an underlying mood disorder, medication effect and/or an underlying medical problem cannot be ruled out based on this test. Clinical correlation is recommended. The patient should be cautioned not to drive, work at heights, or operate dangerous or heavy equipment when tired or sleepy. Review and reiteration of good sleep hygiene measures should be pursued with any patient. The patient can follow up with her referring provider, who will be notified of the test results.   I certify that I have reviewed the raw data recording prior to the issuance of this report in accordance with the standards of the American Academy of Sleep Medicine (AASM).  Huston Foley, MD, PhD Diplomat, ABPN (Neurology and Sleep)                         Start Study Time: End Study Time: Total Recording Time:          10:38:06 PM 7:31:52 AM   8 h, 53 min  Total Sleep Time % REM of Sleep Time:  7 h, 42 min  29.8    Mean: 95 Minimum: 92 Maximum: 98  Mean of Desaturations  Nadirs (%):   92  Oxygen Desaturation. %:   4-9 10-20 >20 Total  Events Number Total    3  1 75.0 25.0  0 0.0  4 100.0  Oxygen Saturation: <90 <=88 <85 <80 <70  Duration (minutes): Sleep % 0.0 0.0  0.0 0.0  0.0 0.0 0.0 0.0 0.0 0.0     Respiratory Indices       Total Events REM NREM All Night  pRDI: pAHI: ODI:  10  9  4  3.0 3.0 1.0 1.6 1.4 0.7 1.8 1.7 0.7       Pulse Rate Statistics during Sleep (BPM)      Mean: 80 Minimum: 48 Maximum:  113    Sleep Summary  Oxygen Saturation Statistics   Indices are calculated using technically valid sleep time of 5 h, 27 min. pRDI/pAHI are calculated using oxi desaturations ? 3%                Sleep Stages Chart

## 2020-01-31 NOTE — Progress Notes (Signed)
Patient referred by Dr. Lawerance Bach for chronic difficulty with sleep initiation and sleep maintenance, seen by me on 12/05/19, HST on 01/11/20.   Please call and notify the patient that the recent home sleep test did not show any significant obstructive sleep apnea, no signif. snoring was noted. Patient can follow up with the referring provider. Please remind patient to try to maintain good sleep hygiene, which means: Keep a regular sleep and wake schedule and make enough time for sleep (7 1/2 to 8 1/2 hours for the average adult), try not to exercise or have a meal within 2 hours of your bedtime, try to keep your bedroom conducive for sleep, that is, cool and dark, without light distractors such as an illuminated alarm clock, and refrain from watching TV right before sleep or in the middle of the night and do not keep the TV or radio on during the night. If a nightlight is used, have it away from the visual field. Also, try not to use or play on electronic devices at bedtime, such as your cell phone, tablet PC or laptop. If you like to read at bedtime on an electronic device, try to dim the background light as much as possible. Do not eat in the middle of the night. Keep pets away from the bedroom environment. For stress relief, try meditation, deep breathing exercises (there are many books and CDs available), a white noise machine or fan can help to diffuse other noise distractors, such as traffic noise. Do not drink alcohol before bedtime, as it can disturb sleep and cause middle of the night awakenings. Never mix alcohol and sedating medications! Avoid narcotic pain medication close to bedtime, as opioids/narcotics can suppress breathing drive and breathing effort.    We had talked about CBT for insomnia and she was encouraged to talk to her therapist about it.   Huston Foley, MD, PhD [facility])

## 2020-01-31 NOTE — Progress Notes (Addendum)
ICD-10-CM   1. Macromastia  N62   2. Chronic bilateral thoracic back pain  M54.6    G89.29   3. Neck pain  M54.2       Patient ID: Robyn Long, female    DOB: 09-27-1985, 34 y.o.   MRN: [id number]   History of Present Illness: Robyn Long is a 34 y.o.  female  with a history of macromastia.  She presents for preoperative evaluation for upcoming procedure, bilateral breast reduction, scheduled for 02/16/2020 with Dr. Ulice Bold.  Summary from previous visit: Patient has large pendulous breasts.  Grade 3 ptosis.  Sternal notch to nipple distance is 31 cm on the right and 33 cm on the left.  Nipple to IMF distance is 12 cm bilaterally.  No visible scars or masses. Currently 38 DD and would like to be a C cup. The estimated excess breast tissue to be removed at time of surgery is 630 g from each side.  Job: Dietitian at [employer]  PMH Significant for: allergies, arthritis, hip surgery (right), depression, maternal aunt had breast cancer.  The patient has not had problems with anesthesia.   Past Medical History: Allergies: No Known Allergies  Current Medications:  Current Outpatient Medications:  Marland Kitchen  MAGNESIUM PO, Take by mouth., Disp: , Rfl:  .  Multiple Vitamins-Minerals (WOMENS MULTIVITAMIN PLUS) TABS, Take 1 tablet by mouth daily., Disp: , Rfl:  .  VITAMIN D PO, Take by mouth., Disp: , Rfl:  .  zolpidem (AMBIEN CR) 6.25 MG CR tablet, Take 6.25 mg by mouth at bedtime as needed for sleep., Disp: , Rfl:  .  cephALEXin (KEFLEX) 500 MG capsule, Take 1 capsule (500 mg total) by mouth 4 (four) times daily for 3 days., Disp: 12 capsule, Rfl: 0 .  HYDROcodone-acetaminophen (NORCO) 5-325 MG tablet, Take 1 tablet by mouth every 8 (eight) hours as needed for up to 5 days for severe pain., Disp: 15 tablet, Rfl: 0 .  ondansetron (ZOFRAN) 4 MG tablet, Take 1 tablet (4 mg total) by mouth every 8 (eight) hours as needed for nausea or vomiting., Disp: 20 tablet, Rfl:  0  Past Medical Problems: Past Medical History:  Diagnosis Date  . Abnormal Pap smear of cervix 2014  . Allergy   . Arthritis   . Depression   . History of chicken pox     Past Surgical History: Past Surgical History:  Procedure Laterality Date  . CRYOTHERAPY  2014   for abnormal pap smear  . HIP SURGERY     right hip    Social History: Social History   Socioeconomic History  . Marital status: Married    Spouse name: Not on file  . Number of children: Not on file  . Years of education: 16+  . Highest education level: Not on file  Occupational History  . Occupation: Associate Professor: [employer]  Tobacco Use  . Smoking status: Never Smoker  . Smokeless tobacco: Never Used  Vaping Use  . Vaping Use: Never used  Substance and Sexual Activity  . Alcohol use: Yes    Alcohol/week: 2.0 standard drinks    Types: 2 Standard drinks or equivalent per week  . Drug use: No  . Sexual activity: Yes    Partners: Male    Birth control/protection: Condom  Other Topics Concern  . Not on file  Social History Narrative   Caffeine Use-yes   Regular exercise-yes   Social Determinants  of Health   Financial Resource Strain:   . Difficulty of Paying Living Expenses:   Food Insecurity:   . Worried About Charity fundraiser in the Last Year:   . Arboriculturist in the Last Year:   Transportation Needs:   . Film/video editor (Medical):   Marland Kitchen Lack of Transportation (Non-Medical):   Physical Activity:   . Days of Exercise per Week:   . Minutes of Exercise per Session:   Stress:   . Feeling of Stress :   Social Connections:   . Frequency of Communication with Friends and Family:   . Frequency of Social Gatherings with Friends and Family:   . Attends Religious Services:   . Active Member of Clubs or Organizations:   . Attends Archivist Meetings:   Marland Kitchen Marital Status:   Intimate Partner Violence:   . Fear of Current or Ex-Partner:   .  Emotionally Abused:   Marland Kitchen Physically Abused:   . Sexually Abused:     Family History: Family History  Problem Relation Age of Onset  . Mitral valve prolapse Father   . Hypertension Mother   . Colon polyps Mother   . Lymphoma Mother   . Breast cancer Maternal Aunt 40       deceased from breast cancer  . Lung cancer Paternal Grandfather   . Mitral valve prolapse Paternal Aunt   . Breast cancer Paternal Aunt        over 38  . Lung cancer Maternal Grandfather   . Mitral valve prolapse Paternal Aunt   . Breast cancer Paternal Aunt        over 34   . Colon cancer Neg Hx     Review of Systems: Review of Systems  Constitutional: Negative for chills and fever.  HENT: Negative for congestion and sore throat.   Respiratory: Negative for cough and shortness of breath.   Cardiovascular: Negative for chest pain and palpitations.  Gastrointestinal: Negative for abdominal pain, nausea and vomiting.  Musculoskeletal: Negative for back pain, joint pain, myalgias and neck pain.  Skin: Negative for itching and rash.    Physical Exam: Vital Signs BP 113/64 (BP Location: Left Arm, Patient Position: Sitting, Cuff Size: Large)   Pulse 80   Temp 97.8 F (36.6 C) (Temporal)   Ht 5\' 11"  (1.803 m)   Wt 199 lb (90.3 kg)   SpO2 95%   BMI 27.75 kg/m  Physical Exam Constitutional:      Appearance: Normal appearance. She is normal weight.  HENT:     Head: Normocephalic and atraumatic.  Eyes:     Extraocular Movements: Extraocular movements intact.  Cardiovascular:     Rate and Rhythm: Normal rate and regular rhythm.     Pulses: Normal pulses.     Heart sounds: Normal heart sounds.  Pulmonary:     Effort: Pulmonary effort is normal.     Breath sounds: Normal breath sounds. No wheezing, rhonchi or rales.  Abdominal:     General: Bowel sounds are normal.     Palpations: Abdomen is soft.  Musculoskeletal:        General: No swelling. Normal range of motion.     Cervical back: Normal range  of motion.  Skin:    General: Skin is warm and dry.     Coloration: Skin is not pale.     Findings: No erythema or rash.  Neurological:     General: No focal deficit present.  Mental Status: She is alert and oriented to person, place, and time.  Psychiatric:        Mood and Affect: Mood normal.        Behavior: Behavior normal.        Thought Content: Thought content normal.        Judgment: Judgment normal.     Assessment/Plan:  Robyn Long scheduled for bilateral breast reduction with Dr. Ulice Bold.  Risks, benefits, and alternatives of procedure discussed, questions answered and consent obtained.    Smoking Status: non-smoker; Counseling Given? N/A Last Mammogram: 06/27/19; Results: No findings suspicious of malignancy  Caprini Score: 3 Moderate; Risk Factors include: 34 year old female, BMI > 25, and length of planned surgery. Recommendation for mechanical or pharmacological prophylaxis during surgery. Encourage early ambulation.   Pictures obtained: 06/21/19  Post-op Rx sent to pharmacy: Norco, Zofran, Keflex Recommend she stop taking Vitamin D and Multivitamin ~ 5 days prior to surgery to reduce bleeding risk.  May resume ~ 2 days after surgery.  Patient was provided with the Breast Reduction Risks and General Surgical Risk consent document and Pain Medication Agreement prior to their appointment.  They had adequate time to read through the risk consent documents and Pain Medication Agreement. We also discussed them in person together during this preop appointment. All of their questions were answered to their satisfaction.  Recommended calling if they have any further questions.  Risk consent form and Pain Medication Agreement to be scanned into patient's chart.  The risk that can be encountered with breast reduction were discussed and include the following but not limited to these:  Breast asymmetry, fluid accumulation, firmness of the breast, inability to breast feed,  loss of nipple or areola, skin loss, decrease or no nipple sensation, fat necrosis of the breast tissue, bleeding, infection, healing delay.  There are risks of anesthesia, changes to skin sensation and injury to nerves or blood vessels.  The muscle can be temporarily or permanently injured.  You may have an allergic reaction to tape, suture, glue, blood products which can result in skin discoloration, swelling, pain, skin lesions, poor healing.  Any of these can lead to the need for revisonal surgery or stage procedures.  A reduction has potential to interfere with diagnostic procedures.  Nipple or breast piercing can increase risks of infection.  This procedure is best done when the breast is fully developed.  Changes in the breast will continue to occur over time.  Pregnancy can alter the outcomes of previous breast reduction surgery, weight gain and weigh loss can also effect the long term appearance.   The 21st Century Cures Act was signed into law in 2016 which includes the topic of electronic health records.  This provides immediate access to information in MyChart.  This includes consultation notes, operative notes, office notes, lab results and pathology reports.  If you have any questions about what you read please let us know at your next visit or call us at the office.  We are right here with you.   Electronically signed by: Eldridge Abrahams, PA-C 02/01/2020 10:10 AM

## 2020-02-01 ENCOUNTER — Encounter: Payer: Self-pay | Admitting: Plastic Surgery

## 2020-02-01 ENCOUNTER — Ambulatory Visit (INDEPENDENT_AMBULATORY_CARE_PROVIDER_SITE_OTHER): Payer: Self-pay | Admitting: Plastic Surgery

## 2020-02-01 ENCOUNTER — Other Ambulatory Visit: Payer: Self-pay

## 2020-02-01 VITALS — BP 113/64 | HR 80 | Temp 97.8°F | Ht 71.0 in | Wt 199.0 lb

## 2020-02-01 DIAGNOSIS — N62 Hypertrophy of breast: Secondary | ICD-10-CM

## 2020-02-01 DIAGNOSIS — G8929 Other chronic pain: Secondary | ICD-10-CM

## 2020-02-01 DIAGNOSIS — M542 Cervicalgia: Secondary | ICD-10-CM

## 2020-02-01 DIAGNOSIS — M546 Pain in thoracic spine: Secondary | ICD-10-CM

## 2020-02-01 MED ORDER — HYDROCODONE-ACETAMINOPHEN 5-325 MG PO TABS: 1.0000 | ORAL_TABLET | Freq: Three times a day (TID) | ORAL | 0 refills | Status: AC | PRN

## 2020-02-01 MED ORDER — CEPHALEXIN 500 MG PO CAPS: 500.0000 mg | ORAL_CAPSULE | Freq: Four times a day (QID) | ORAL | 0 refills | Status: AC

## 2020-02-01 MED ORDER — ONDANSETRON HCL 4 MG PO TABS: 4.0000 mg | ORAL_TABLET | Freq: Three times a day (TID) | ORAL | 0 refills | Status: DC | PRN

## 2020-02-03 ENCOUNTER — Ambulatory Visit: Payer: 59 | Admitting: Psychology

## 2020-02-09 HISTORY — PX: BREAST REDUCTION SURGERY: SHX8

## 2020-02-10 ENCOUNTER — Telehealth: Payer: Self-pay | Admitting: Plastic Surgery

## 2020-02-10 ENCOUNTER — Ambulatory Visit (INDEPENDENT_AMBULATORY_CARE_PROVIDER_SITE_OTHER): Payer: 59 | Admitting: Psychology

## 2020-02-10 DIAGNOSIS — F332 Major depressive disorder, recurrent severe without psychotic features: Secondary | ICD-10-CM

## 2020-02-10 NOTE — Telephone Encounter (Signed)
Patient said she was asked by the surgery center if she was giving any pre showering instructions and she couldn't remember if she had been so she would like to know if there are any instructions. Please call patient to advise whether there is anything specific she should do with showering or not. [phone]

## 2020-02-10 NOTE — Telephone Encounter (Signed)
Returned patient call. Advised her the [facility] usually gives instructions for showering night before surgery (02/16/20). Will ask Loistine Simas, or Dr. Ulice Bold Tuesday and call patient back.

## 2020-02-14 ENCOUNTER — Encounter: Payer: Managed Care, Other (non HMO) | Admitting: Plastic Surgery

## 2020-02-14 NOTE — Telephone Encounter (Signed)
Returned patients call. Advised her that we do no have a special soap that needs to be used prior to surgery other than a mild, mild nonfragrance soap. Ex: ivory, or Dove.  If she has any further questions, she can call back.

## 2020-02-16 ENCOUNTER — Other Ambulatory Visit: Payer: Self-pay | Admitting: Plastic Surgery

## 2020-02-17 ENCOUNTER — Ambulatory Visit: Payer: 59 | Admitting: Psychology

## 2020-02-24 ENCOUNTER — Ambulatory Visit (INDEPENDENT_AMBULATORY_CARE_PROVIDER_SITE_OTHER): Payer: Managed Care, Other (non HMO) | Admitting: Surgical

## 2020-02-24 ENCOUNTER — Encounter: Payer: Self-pay | Admitting: Internal Medicine

## 2020-02-24 ENCOUNTER — Encounter: Payer: Self-pay | Admitting: Surgical

## 2020-02-24 ENCOUNTER — Other Ambulatory Visit: Payer: Self-pay

## 2020-02-24 ENCOUNTER — Ambulatory Visit: Payer: 59 | Admitting: Psychology

## 2020-02-24 VITALS — BP 103/63 | HR 91 | Temp 99.0°F

## 2020-02-24 DIAGNOSIS — N62 Hypertrophy of breast: Secondary | ICD-10-CM

## 2020-02-24 DIAGNOSIS — M542 Cervicalgia: Secondary | ICD-10-CM

## 2020-02-24 DIAGNOSIS — M546 Pain in thoracic spine: Secondary | ICD-10-CM

## 2020-02-24 DIAGNOSIS — G8929 Other chronic pain: Secondary | ICD-10-CM

## 2020-02-24 NOTE — Progress Notes (Signed)
Patient is a 33 year old female here for follow-up after bilateral breast reduction on 03/07/2020 with Dr. Ulice Bold. Patient reports she is doing well today, she has used Tylenol ibuprofen for pain control and has not needed any hydrocodone recently. She is not having any f/c/n/v.   She feels as if her breasts are little bit swollen at the portion near her armpit and she feels as if there rub her arm when she moves.  On exam bilateral NAC complexes with good color and cap refill, incision is C/D/I.  Aquacel Ag dressing in place over vertical limb incision as well as Steri-Strips. No overt seroma, hematoma noted.  No fluid wave on exam.  Bilateral breasts are soft.  No tenderness on palpation.  Recommend continuing wear sports bra or breast binder 24/7 x6 weeks postop. Can remove Aquacel Ag dressing in 1 week.  Steri-Strips will fall off will be removed at next appointment. Call with any questions or concerns

## 2020-02-27 MED ORDER — MIRTAZAPINE 15 MG PO TABS: 15.0000 mg | ORAL_TABLET | Freq: Every day | ORAL | 2 refills | Status: DC

## 2020-03-01 MED ORDER — RAMELTEON 8 MG PO TABS: 8.0000 mg | ORAL_TABLET | Freq: Every day | ORAL | 5 refills | Status: DC

## 2020-03-01 NOTE — Addendum Note (Signed)
Addended by: Pincus Sanes on: 03/01/2020 08:12 AM   Modules accepted: Orders

## 2020-03-02 ENCOUNTER — Ambulatory Visit: Payer: 59 | Admitting: Psychology

## 2020-03-06 ENCOUNTER — Other Ambulatory Visit: Payer: Self-pay

## 2020-03-06 ENCOUNTER — Encounter: Payer: Self-pay | Admitting: Plastic Surgery

## 2020-03-06 ENCOUNTER — Ambulatory Visit (INDEPENDENT_AMBULATORY_CARE_PROVIDER_SITE_OTHER): Payer: Managed Care, Other (non HMO) | Admitting: Plastic Surgery

## 2020-03-06 VITALS — BP 136/71 | HR 91 | Temp 98.2°F

## 2020-03-06 DIAGNOSIS — N63 Unspecified lump in unspecified breast: Secondary | ICD-10-CM

## 2020-03-06 NOTE — Progress Notes (Signed)
   Subjective:    Patient ID: Robyn Long, female    DOB: 1986-07-23, 34 y.o.   MRN: [id number]  The patient is a 34 year old female here for follow-up after undergoing bilateral breast reduction.  The Steri-Strips and Dermabond are still in place.  There is no sign of infection..  There is no drainage.  She has not had any fevers or chills.  I do not detect any hematoma or seroma.    Review of Systems  Constitutional: Negative.   HENT: Negative.   Eyes: Negative.   Respiratory: Negative.   Cardiovascular: Negative.   Genitourinary: Negative.   Musculoskeletal: Negative.   Hematological: Negative.        Objective:   Physical Exam Vitals and nursing note reviewed.  Constitutional:      Appearance: Normal appearance.  HENT:     Head: Normocephalic and atraumatic.  Cardiovascular:     Rate and Rhythm: Normal rate.     Pulses: Normal pulses.  Pulmonary:     Effort: Pulmonary effort is normal.  Neurological:     General: No focal deficit present.     Mental Status: She is alert and oriented to person, place, and time.  Psychiatric:        Mood and Affect: Mood normal.        Behavior: Behavior normal.        Thought Content: Thought content normal.         Assessment & Plan:     ICD-10-CM   1. Large mass of breast  N63.0     Recommend the patient start removing her dressing and Steri-Strips laterally in the shower over the next week.  Continue with the sports bra.  I like to see her back in a couple of weeks.  I encouraged her that she is still very much in the healing process and this is not the end result at all.  Call with any questions or concerns.

## 2020-03-07 ENCOUNTER — Telehealth: Payer: Self-pay | Admitting: Neurology

## 2020-03-07 NOTE — Telephone Encounter (Signed)
This is FYI pt has called back and scheduled her sleep study f/u with Dr Frances Furbish, no call back requested

## 2020-03-09 ENCOUNTER — Telehealth: Payer: Self-pay | Admitting: Plastic Surgery

## 2020-03-09 ENCOUNTER — Ambulatory Visit: Payer: 59 | Admitting: Psychology

## 2020-03-09 NOTE — Telephone Encounter (Signed)
Patient called to say that one of her incisions appears to have opened and she noticed it while removing the bandage in the shower. She said it's very small and at the small juncture point and wants to know what she should be doing. Please call her back to advise.

## 2020-03-09 NOTE — Telephone Encounter (Signed)
Returned patients call. The incision near the areola has appears to have open some. Spoke with Loistine Simas and she advised to apply Vaseline area the incision area and cover with a 4x4. Judeth Cornfield will call her back to schedule her Monday or Tues. Patient understood and agreed.

## 2020-03-11 DIAGNOSIS — Z9889 Other specified postprocedural states: Secondary | ICD-10-CM | POA: Insufficient documentation

## 2020-03-11 NOTE — Progress Notes (Signed)
Patient is a 34 yr-old female here for follow-up after undergoing a bilateral breast reduction on 02/16/20 with Dr. Ulice Bold.   ~ 4 weeks PO Patient presents today due to concern that her incision near the areola has opened. Duoderm dressing was stuck to the suture knot and pulling her skin. Incision is intact; c/d. Remaining steri-strips and dressings were removed bilaterally.  All incisions are healing well, c/d/i. No signs of infection, redness, drainage, or hematoma/sermoa.  Denies F,C,N/V.  Keep follow up appt in 2 weeks.  Continue to wear sports bra 24/7 until 6 weeks PO. May shower.  Call office with any questions/concerns.  The 21st Century Cures Act was signed into law in 2016 which includes the topic of electronic health records.  This provides immediate access to information in MyChart.  This includes consultation notes, operative notes, office notes, lab results and pathology reports.  If you have any questions about what you read please let us know at your next visit or call us at the office.  We are right here with you.

## 2020-03-12 ENCOUNTER — Ambulatory Visit (INDEPENDENT_AMBULATORY_CARE_PROVIDER_SITE_OTHER): Payer: Managed Care, Other (non HMO) | Admitting: Plastic Surgery

## 2020-03-12 ENCOUNTER — Encounter: Payer: Self-pay | Admitting: Plastic Surgery

## 2020-03-12 ENCOUNTER — Other Ambulatory Visit: Payer: Self-pay

## 2020-03-12 DIAGNOSIS — Z9889 Other specified postprocedural states: Secondary | ICD-10-CM

## 2020-03-16 ENCOUNTER — Ambulatory Visit (INDEPENDENT_AMBULATORY_CARE_PROVIDER_SITE_OTHER): Payer: 59 | Admitting: Psychology

## 2020-03-16 DIAGNOSIS — F332 Major depressive disorder, recurrent severe without psychotic features: Secondary | ICD-10-CM | POA: Diagnosis not present

## 2020-03-23 ENCOUNTER — Ambulatory Visit: Payer: 59 | Admitting: Psychology

## 2020-03-27 ENCOUNTER — Encounter: Payer: Self-pay | Admitting: Plastic Surgery

## 2020-03-27 ENCOUNTER — Ambulatory Visit (INDEPENDENT_AMBULATORY_CARE_PROVIDER_SITE_OTHER): Payer: Self-pay | Admitting: Plastic Surgery

## 2020-03-27 ENCOUNTER — Other Ambulatory Visit: Payer: Self-pay

## 2020-03-27 VITALS — BP 109/63 | HR 99 | Temp 98.9°F

## 2020-03-27 DIAGNOSIS — Z9889 Other specified postprocedural states: Secondary | ICD-10-CM

## 2020-03-27 DIAGNOSIS — Z719 Counseling, unspecified: Secondary | ICD-10-CM

## 2020-03-27 NOTE — Progress Notes (Signed)
The patient is a 34 year old female here for follow-up after undergoing bilateral breast reduction.  Her incisions are healing very nicely.  There is no sign of seroma or hematoma.  She feels like she is getting back to normal.  She has not increased her activity yet but she will be in the next week as she starts back at school.  Her pain is well controlled and her bowels are back to normal.  There is a little bit of swelling at the inframammary fold.  I put some brown tape on that to help.  Continue with the sports bra and increase activity slowly.  I like to see her back in 3 to 4 months.  Pictures were obtained of the patient and placed in the chart with the patient's or guardian's permission.

## 2020-03-30 ENCOUNTER — Ambulatory Visit: Payer: 59 | Admitting: Psychology

## 2020-04-06 ENCOUNTER — Ambulatory Visit: Payer: 59 | Admitting: Psychology

## 2020-04-13 ENCOUNTER — Ambulatory Visit (INDEPENDENT_AMBULATORY_CARE_PROVIDER_SITE_OTHER): Payer: 59 | Admitting: Psychology

## 2020-04-13 DIAGNOSIS — F332 Major depressive disorder, recurrent severe without psychotic features: Secondary | ICD-10-CM | POA: Diagnosis not present

## 2020-04-20 ENCOUNTER — Ambulatory Visit: Payer: 59 | Admitting: Psychology

## 2020-04-27 ENCOUNTER — Ambulatory Visit: Payer: 59 | Admitting: Psychology

## 2020-05-04 ENCOUNTER — Ambulatory Visit: Payer: 59 | Admitting: Psychology

## 2020-05-08 ENCOUNTER — Ambulatory Visit (INDEPENDENT_AMBULATORY_CARE_PROVIDER_SITE_OTHER): Payer: Managed Care, Other (non HMO) | Admitting: Neurology

## 2020-05-08 ENCOUNTER — Encounter: Payer: Self-pay | Admitting: Neurology

## 2020-05-08 ENCOUNTER — Other Ambulatory Visit: Payer: Self-pay

## 2020-05-08 VITALS — BP 128/76 | HR 88 | Ht 71.0 in | Wt 203.0 lb

## 2020-05-08 DIAGNOSIS — R0683 Snoring: Secondary | ICD-10-CM | POA: Diagnosis not present

## 2020-05-08 DIAGNOSIS — G479 Sleep disorder, unspecified: Secondary | ICD-10-CM

## 2020-05-08 DIAGNOSIS — G4700 Insomnia, unspecified: Secondary | ICD-10-CM

## 2020-05-08 DIAGNOSIS — E663 Overweight: Secondary | ICD-10-CM

## 2020-05-08 DIAGNOSIS — Z72820 Sleep deprivation: Secondary | ICD-10-CM

## 2020-05-08 DIAGNOSIS — R5383 Other fatigue: Secondary | ICD-10-CM

## 2020-05-08 DIAGNOSIS — R519 Headache, unspecified: Secondary | ICD-10-CM

## 2020-05-08 NOTE — Progress Notes (Signed)
Subjective:    Patient ID: Robyn Long is a 34 y.o. female.  HPI     Interim history:    [name] is a 35 year old right-handed woman with an underlying medical history of neck pain, back pain, arthritis, allergies and mildly overweight state, who Presents for follow-up consultation of her sleep difficulties of many years duration, after a recent home sleep test.  The patient is unaccompanied today.  I first met her at the request of her primary care physician on 12/05/2019, at which time she reported a longstanding history of difficulty initiating or maintaining sleep.  She had tried and failed over-the-counter sleep aids as well as several prescription medications.  She was advised to talk to her therapist about pursuing specific cognitive behavioral therapy for chronic insomnia.  She had a home sleep test in the interim on 01/11/2020 which did not show any significant snoring or sleep disordered breathing otherwise with an AHI of 1.7/h, O2 nadir of 92%.  Today, 05/08/2020: she reports that she has been on Rozerem for little over a month.  It has been minimal helpful, she may obtain about 2 or 3 hours of sleep.  She has difficulty functioning during the day.  She had blood work through her OB/GYN recently.  She is not sure if she had her thyroid function checked.  She is advised that she can always talk to her primary care physician about additional blood work.  She has never been anemic.  Belsomra was not covered by her insurance.  She would be interested in pursuing an in lab sleep study.  She has no new symptoms, intermittent snoring is reported as well as occasional morning headaches.  She has worked on her sleep hygiene.  She has talked to her therapist and has pursued cognitive behavioral therapy.  The patient's allergies, current medications, family history, past medical history, past social history, past surgical history and problem list were reviewed and updated as appropriate.    Previously:   12/05/19: (She) reports a longstanding history of difficulty with her sleep since high school years, more pronounced over the past year with difficulty maintaining sleep nearly nightly.  She has tried over-the-counter medications including Unisom and melatonin without success.  She has tried prescription medication including trazodone and Lunesta which did not help, currently on Ambien CR 6.25 mg strength 1 to 2 pills as needed.  She took 2 pills last night.  She does not find it very effective either.  She does not take it every night.  She has tried improving her sleep hygiene and tries to keep a set schedule for her bedtime and rise time.  She has talked to a therapist because of symptoms of anxiety and depression and was encouraged to follow-up with you for insomnia.  I reviewed your office note from 09/19/2019.  Her Epworth sleepiness score is 3 out of 24, fatigue severity score is 46 out of 63.  Her father has difficulty sleeping.  There is no family history of OSA.  She goes to bed around 10 and rise time is around 7.  She teaches at [employer].  She is a non-smoker and drinks alcohol occasionally, maybe twice a week and caffeine and limitation, up to 1 cup/day on average. She typically falls asleep within half an hour but wakes up after 2 to 3 hours and has a significant difficulty falling back to sleep, may doze off for about half an hour in the early morning hours.  She lives with  her husband.  She snores mildly and intermittently, denies any telltale symptoms of restless leg syndrome or leg twitching at night.  She has no night to night nocturia but has woken up occasionally with a headache.  Her Past Medical History Is Significant For: Past Medical History:  Diagnosis Date  . Abnormal Pap smear of cervix 2014  . Allergy   . Arthritis   . Depression   . History of chicken pox     Her Past Surgical History Is Significant For: Past Surgical History:  Procedure  Laterality Date  . CRYOTHERAPY  2014   for abnormal pap smear  . HIP SURGERY     right hip    Her Family History Is Significant For: Family History  Problem Relation Age of Onset  . Mitral valve prolapse Father   . Hypertension Mother   . Colon polyps Mother   . Lymphoma Mother   . Breast cancer Maternal Aunt 40       deceased from breast cancer  . Lung cancer Paternal Grandfather   . Mitral valve prolapse Paternal Aunt   . Breast cancer Paternal Aunt        over 103  . Lung cancer Maternal Grandfather   . Mitral valve prolapse Paternal Aunt   . Breast cancer Paternal Aunt        over 68   . Colon cancer Neg Hx     Her Social History Is Significant For: Social History   Socioeconomic History  . Marital status: Married    Spouse name: Not on file  . Number of children: Not on file  . Years of education: 16+  . Highest education level: Not on file  Occupational History  . Occupation: Administrator, [employer] Service: [employer]  Tobacco Use  . Smoking status: Never Smoker  . Smokeless tobacco: Never Used  Vaping Use  . Vaping Use: Never used  Substance and Sexual Activity  . Alcohol use: Yes    Alcohol/week: 2.0 standard drinks    Types: 2 Standard drinks or equivalent per week  . Drug use: No  . Sexual activity: Yes    Partners: Male    Birth control/protection: Condom  Other Topics Concern  . Not on file  Social History Narrative   Caffeine Use-yes   Regular exercise-yes   Social Determinants of Health   Financial Resource Strain:   . Difficulty of Paying Living Expenses: Not on file  Food Insecurity:   . Worried About Charity fundraiser in the Last Year: Not on file  . Ran Out of Food in the Last Year: Not on file  Transportation Needs:   . Lack of Transportation (Medical): Not on file  . Lack of Transportation (Non-Medical): Not on file  Physical Activity:   . Days of Exercise per Week: Not on file  . Minutes of Exercise per Session: Not  on file  Stress:   . Feeling of Stress : Not on file  Social Connections:   . Frequency of Communication with Friends and Family: Not on file  . Frequency of Social Gatherings with Friends and Family: Not on file  . Attends Religious Services: Not on file  . Active Member of Clubs or Organizations: Not on file  . Attends Archivist Meetings: Not on file  . Marital Status: Not on file    Her Allergies Are:  No Known Allergies:   Her Current Medications Are:  Outpatient Encounter Medications as  of 05/08/2020  Medication Sig  . MAGNESIUM PO Take by mouth.  . Multiple Vitamins-Minerals (WOMENS MULTIVITAMIN PLUS) TABS Take 1 tablet by mouth daily.  . ramelteon (ROZEREM) 8 MG tablet Take 1 tablet (8 mg total) by mouth at bedtime.  Marland Kitchen VITAMIN D PO Take by mouth.  . [DISCONTINUED] ondansetron (ZOFRAN) 4 MG tablet Take 1 tablet (4 mg total) by mouth every 8 (eight) hours as needed for nausea or vomiting. (Patient not taking: Reported on 03/12/2020)   No facility-administered encounter medications on file as of 05/08/2020.  :  Review of Systems:  Out of a complete 14 point review of systems, all are reviewed and negative with the exception of these symptoms as listed below: Review of Systems  Neurological:       Pt reports she is here to discuss the option of reordering the in lab sleep study to see if insurance will approve.  Pt reports she is still struggling with daytime sleepiness and is on her 4th sleeping aid. Pt has tried and failed Trazodone, Ambien and Lunesta.  She is currently on Rozerem 8 mg at bedtime but has not noticed this medication helping.     Objective:  Neurological Exam  Physical Exam Physical Examination:   Vitals:   05/08/20 1143  BP: 128/76  Pulse: 88  SpO2: 97%    General Examination: The patient is a very pleasant 34 y.o. female in no acute distress. She appears well-developed and well-nourished and well groomed.   HEENT: Normocephalic,  atraumatic, pupils are equal, round and reactive to light, extraocular tracking is well preserved, face is symmetric, normal facial animation, no dysarthria, no hypophonia, no neck or jaw tremor.  Oropharynx exam reveals: no significant mouth dryness, good dental hygiene and mild airway crowding. Tongue protrudes centrally and palate elevates symmetrically.   Chest: Clear to auscultation without wheezing, rhonchi or crackles noted.  Heart: S1+S2+0, regular and normal without murmurs, rubs or gallops noted.   Abdomen: Soft, non-tender and non-distended with normal bowel sounds appreciated on auscultation.  Extremities: There is no pitting edema in the distal lower extremities bilaterally.   Skin: Warm and dry without trophic changes noted.   Musculoskeletal: exam reveals no obvious joint deformities, tenderness or joint swelling or erythema.   Neurologically:  Mental status: The patient is awake, alert and oriented in all 4 spheres. Her immediate and remote memory, attention, language skills and fund of knowledge are appropriate. There is no evidence of aphasia, agnosia, apraxia or anomia. Speech is clear with normal prosody and enunciation. Thought process is linear. Mood is normal and affect is normal.  Cranial nerves II - XII are as described above under HEENT exam.  Motor exam: Normal bulk, strength and tone is noted. There is no tremor, Romberg is negative. Fine motor skills and coordination: grossly intact.  Cerebellar testing: No dysmetria or intention tremor. There is no truncal or gait ataxia.  Sensory exam: intact to light touch in the upper and lower extremities.  Gait, station and balance: She stands easily. No veering to one side is noted. No leaning to one side is noted. Posture is age-appropriate and stance is narrow based. Gait shows normal stride length and normal pace. No problems turning are noted. Tandem walk is unremarkable. All stable.                Assessment and  Plan:   In summary, Kechia Yahnke is a very pleasant 34 year old female with an underlying medical history of neck  pain, back pain, arthritis, allergies and mildly overweight state, who presents for follow-up consultation of her sleep disorder, in particular her longstanding history of difficulty maintaining sleep.  She has tried and failed multiple over-the-counter and prescription sleep aids.  She is currently on Rozerem with marginal improvement.  A home sleep test on 01/11/2020 was negative for any significant sleep disordered breathing.  We mutually agreed to try to pursue an in lab sleep study at this time.  She has talked to her therapist, she has pursued improving her sleep hygiene and also cognitive behavioral therapy.  She is advised to talk to her primary care physician about the possibility of getting a referral to psychiatry for chronic sleep difficulty next if the Rozerem does not help consistently.  She has not had any recent blood work in our system to look for thyroid dysfunction, B12 deficiency, vitamin D deficiency, anemia, and is encouraged to see about getting blood work done but she does mention that she had extensive blood work through her GYN.  She reports that she is planning to get pregnant.  We will keep her posted as to her appointment for in lab sleep testing.  If she has obstructive sleep apnea, we can certainly pursue treatment for this.  She denies any significant degree of daytime somnolence or involuntary episodes of dozing off.  In fact, she cannot nap even if she tries. I plan to see her back after testing.  I answered all her questions today and she was in agreement with the plan. I spent 30 minutes in total face-to-face time and in reviewing records during pre-charting, more than 50% of which was spent in counseling and coordination of care, reviewing test results, reviewing medications and treatment regimen and/or in discussing or reviewing the diagnosis of chronic sleep  difficulty, the prognosis and treatment options. Pertinent laboratory and imaging test results that were available during this visit with the patient were reviewed by me and considered in my medical decision making (see chart for details).

## 2020-05-08 NOTE — Patient Instructions (Addendum)
It was good to see you again today.  I am sorry that you are still having such difficulty with your sleep.   As discussed, we will request a repeat sleep study but an in lab study this time.  Please talk to your primary care physician, Dr. Lawerance Bach about the possibility of seeing a psychiatrist as you have tried and failed multiple prescription standard sleep aids.  It may not be a bad idea to get input from a different counselor/therapist as you indicated.  We will call you from the sleep lab to see if we can proceed with an in lab study.  Our sleep lab staff may also be able to tell you approximate out-of-pocket cost as you asked about the cost.

## 2020-05-11 ENCOUNTER — Ambulatory Visit (INDEPENDENT_AMBULATORY_CARE_PROVIDER_SITE_OTHER): Payer: 59 | Admitting: Psychology

## 2020-05-11 DIAGNOSIS — F332 Major depressive disorder, recurrent severe without psychotic features: Secondary | ICD-10-CM | POA: Diagnosis not present

## 2020-05-17 ENCOUNTER — Telehealth: Payer: Self-pay

## 2020-05-17 NOTE — Telephone Encounter (Signed)
LVM to schedule in-lab sleep study

## 2020-05-18 ENCOUNTER — Ambulatory Visit: Payer: 59 | Admitting: Psychology

## 2020-05-23 ENCOUNTER — Telehealth: Payer: Self-pay

## 2020-05-23 NOTE — Telephone Encounter (Signed)
AEX 05/2019 H/o labral tear in hip joint, had sx 12/2018 LMP 10/3 No contraception except condoms every time per pt.   Spoke with pt. Pt states having urinary sx of frequency, urgency, pressure, pain with urination, burning, and cloudy urine x 1 week. Pt states started on 10/5, was seen at Urgent Care on 10/6 and had negative UC. States was given Keflex Rx but did not take due to not knowing outcome of UC.  Pt states sx have not gone away. Denies fever, chills, back pain. Has not used any OTC for treatment except Tylenol, which has not resolved sx. Denies vaginal discharge, bleeding, itching. Is having some different "smell" since sx started.  Pt also states having irregular cycles the past 2 months. States having heavier bleeding with changing pad/tampon every 1 hours. Denies feeling lightheaded, dizzy, weak, blood clots.   Pt advised to have OV for sx and irreg cycles. Pt agreeable. Pt scheduled for 10/14 at 2 pm. Pt agreeable to date and time of appt. Pt given ER precautions if sx worsen overnight. Pt verbalized understanding.  Encounter closed

## 2020-05-23 NOTE — Telephone Encounter (Signed)
Patient returning call to Stephanie.  

## 2020-05-23 NOTE — Telephone Encounter (Signed)
Call from patient requesting an appointment with Dr. Oscar La for UTI symptoms. Patient complains of having urinary frequency, urgency, pressure, pain with urination, burning, and cloudy urine. Per patient, went to urgent care one week ago and culture was normal. Patient is still having symptoms. 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 Please advise.

## 2020-05-23 NOTE — Telephone Encounter (Signed)
Left message for pt to return call to triage RN. 

## 2020-05-24 ENCOUNTER — Encounter: Payer: Self-pay | Admitting: Obstetrics and Gynecology

## 2020-05-24 ENCOUNTER — Other Ambulatory Visit: Payer: Self-pay

## 2020-05-24 ENCOUNTER — Ambulatory Visit (INDEPENDENT_AMBULATORY_CARE_PROVIDER_SITE_OTHER): Payer: Managed Care, Other (non HMO) | Admitting: Obstetrics and Gynecology

## 2020-05-24 VITALS — BP 110/66 | HR 135 | Temp 98.1°F | Ht 71.0 in | Wt 204.0 lb

## 2020-05-24 DIAGNOSIS — N3281 Overactive bladder: Secondary | ICD-10-CM

## 2020-05-24 DIAGNOSIS — N898 Other specified noninflammatory disorders of vagina: Secondary | ICD-10-CM

## 2020-05-24 LAB — POCT URINALYSIS DIPSTICK
Bilirubin, UA: NEGATIVE
Glucose, UA: NEGATIVE
Ketones, UA: NEGATIVE
Nitrite, UA: NEGATIVE
Protein, UA: NEGATIVE
Urobilinogen, UA: NEGATIVE E.U./dL — AB
pH, UA: 7 (ref 5.0–8.0)

## 2020-05-24 NOTE — Progress Notes (Signed)
GYNECOLOGY  VISIT   HPI: 34 y.o.   Married White or Caucasian Not Hispanic or Latino  female   [id number] with No LMP recorded.   here for urgency of urination, frequency of urination and pelvic pressure.   Symptoms started 9 days ago and are intermittent.  She has intermittent frequency and urgency to void and pressure. She had some dysuria, it has resolved.  She had a negative urine culture at Urgent care pm 05/16/20.  Feels like she needs to void, voiding small amounts, sense that she needs to voids. Bearing down relieves the pressure. She has stopped drinking coffee. Drinks tea 3 x a week.   She denies vaginal d/c, but has a slight odor. No itching, burning or irritation.   GYNECOLOGIC HISTORY: No LMP recorded. Contraception:Condoms  Menopausal hormone therapy: none         OB History    Gravida  0   Para  0   Term  0   Preterm  0   AB  0   Living  0     SAB  0   TAB  0   Ectopic  0   Multiple  0   Live Births                 Patient Active Problem List   Diagnosis Date Noted  . S/P bilateral breast reduction 03/11/2020  . Insomnia 09/19/2019  . Back pain 06/21/2019  . Neck pain 06/21/2019  . 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 Elevated blood pressure reading 12/13/2018  . Trigger point of right shoulder region 10/06/2018  . Right shoulder pain 06/15/2018  . Pain of left calf 12/30/2017  . Labral tear of hip joint 12/09/2017  . Nonallopathic lesion of thoracic region 07/01/2017  . Nonallopathic lesion of sacral region 07/01/2017  . Nonallopathic lesion of lumbosacral region 07/01/2017  . Acute bursitis of right shoulder 06/18/2017  . Scapular dyskinesis 06/18/2017  . Right hip pain 03/23/2017  . Constipation 04/08/2016  . Cervical intraepithelial neoplasia grade 2 02/10/2013  . Knee pain, right anterior 01/06/2013    Past Medical History:  Diagnosis Date  . Abnormal Pap smear of cervix 2014  . Allergy   . Arthritis   . Depression    . History of chicken pox     Past Surgical History:  Procedure Laterality Date  . CRYOTHERAPY  2014   for abnormal pap smear  . HIP SURGERY     right hip    Current Outpatient Medications  Medication Sig Dispense Refill  . MAGNESIUM PO Take by mouth.    . Multiple Vitamins-Minerals (WOMENS MULTIVITAMIN PLUS) TABS Take 1 tablet by mouth daily.    . ramelteon (ROZEREM) 8 MG tablet Take 1 tablet (8 mg total) by mouth at bedtime. 30 tablet 5  . VITAMIN D PO Take by mouth.     No current facility-administered medications for this visit.     ALLERGIES: Patient has no known allergies.  Family History  Problem Relation Age of Onset  . Mitral valve prolapse Father   . Hypertension Mother   . Colon polyps Mother   . Lymphoma Mother   . Breast cancer Maternal Aunt 40       deceased from breast cancer  . Lung cancer Paternal Grandfather   . Mitral valve prolapse Paternal Aunt   . Breast cancer Paternal Aunt        over 93  . Lung cancer Maternal Grandfather   .  Mitral valve prolapse Paternal Aunt   . Breast cancer Paternal Aunt        over 32   . Colon cancer Neg Hx     Social History   Socioeconomic History  . Marital status: Married    Spouse name: Not on file  . Number of children: Not on file  . Years of education: 16+  . Highest education level: Not on file  Occupational History  . Occupation: Associate Professor: [employer]  Tobacco Use  . Smoking status: Never Smoker  . Smokeless tobacco: Never Used  Vaping Use  . Vaping Use: Never used  Substance and Sexual Activity  . Alcohol use: Yes    Alcohol/week: 2.0 standard drinks    Types: 2 Standard drinks or equivalent per week  . Drug use: No  . Sexual activity: Yes    Partners: Male    Birth control/protection: Condom  Other Topics Concern  . Not on file  Social History Narrative   Caffeine Use-yes   Regular exercise-yes   Social Determinants of Health   Financial Resource  Strain:   . Difficulty of Paying Living Expenses: Not on file  Food Insecurity:   . Worried About Programme researcher, broadcasting/film/video in the Last Year: Not on file  . Ran Out of Food in the Last Year: Not on file  Transportation Needs:   . Lack of Transportation (Medical): Not on file  . Lack of Transportation (Non-Medical): Not on file  Physical Activity:   . Days of Exercise per Week: Not on file  . Minutes of Exercise per Session: Not on file  Stress:   . Feeling of Stress : Not on file  Social Connections:   . Frequency of Communication with Friends and Family: Not on file  . Frequency of Social Gatherings with Friends and Family: Not on file  . Attends Religious Services: Not on file  . Active Member of Clubs or Organizations: Not on file  . Attends Banker Meetings: Not on file  . Marital Status: Not on file  Intimate Partner Violence:   . Fear of Current or Ex-Partner: Not on file  . Emotionally Abused: Not on file  . Physically Abused: Not on file  . Sexually Abused: Not on file    Review of Systems  Genitourinary: Positive for dysuria, frequency, hematuria and urgency.  All other systems reviewed and are negative.   PHYSICAL EXAMINATION:    BP 110/66   Pulse (!) 135   Temp 98.1 F (36.7 C)   Ht 5\' 11"  (1.803 m)   Wt 204 lb (92.5 kg)   SpO2 99%   BMI 28.45 kg/m     General appearance: alert, cooperative and appears stated age   Pelvic: External genitalia:  no lesions              Urethra:  normal appearing urethra with no masses, tenderness or lesions              Bartholins and Skenes: normal                 Vagina: normal appearing vagina with a slight increase in thick, white vaginal discharge              Cervix: no cervical motion tenderness and no lesions              Bimanual Exam:  Uterus:  normal size, contour, position, consistency, mobility, non-tender and  anteverted              Adnexa: no mass, fullness, tenderness              Bladder: tender to  palpation  Chaperone was present for exam.  Urine dip with trace blood and trace leuk  Wet prep: no clue, no trich, + wbc KOH: no yeast PH: 4   ASSESSMENT Overactive bladder, negative urine culture last week Vaginal odor, some thick d/c noted on exam, negative slides    PLAN Discussed avoiding caffeine, ETOH, spicy food, asitic food and drink lots of water Bladder training sheet given Information on overactive bladder in mychart Send nuswab for BV and yeast Call if symptoms don't improve   In addition to reviewing the vaginal slides, over 20 minutes in total patient care

## 2020-05-24 NOTE — Patient Instructions (Signed)
Avoid caffeine, alcohol, spicy food, acidic food. Drink lots of water   Overactive Bladder, Adult  Overactive bladder refers to a condition in which a person has a sudden need to pass urine. The person may leak urine if he or she cannot get to the bathroom fast enough (urinary incontinence). A person with this condition may also wake up several times in the night to go to the bathroom. Overactive bladder is associated with poor nerve signals between your bladder and your brain. Your bladder may get the signal to empty before it is full. You may also have very sensitive muscles that make your bladder squeeze too soon. These symptoms might interfere with daily work or social activities. What are the causes? This condition may be associated with or caused by:  Urinary tract infection.  Infection of nearby tissues, such as the prostate.  Prostate enlargement.  Surgery on the uterus or urethra.  Bladder stones, inflammation, or tumors.  Drinking too much caffeine or alcohol.  Certain medicines, especially medicines that get rid of extra fluid in the body (diuretics).  Muscle or nerve weakness, especially from: ? A spinal cord injury. ? Stroke. ? Multiple sclerosis. ? Parkinson's disease.  Diabetes.  Constipation. What increases the risk? You may be at greater risk for overactive bladder if you:  Are an older adult.  Smoke.  Are going through menopause.  Have prostate problems.  Have a neurological disease, such as stroke, dementia, Parkinson's disease, or multiple sclerosis (MS).  Eat or drink things that irritate the bladder. These include alcohol, spicy food, and caffeine.  Are overweight or obese. What are the signs or symptoms? Symptoms of this condition include:  Sudden, strong urge to urinate.  Leaking urine.  Urinating 8 or more times a day.  Waking up to urinate 2 or more times a night. How is this diagnosed? Your health care provider may suspect  overactive bladder based on your symptoms. He or she will diagnose this condition by:  A physical exam and medical history.  Blood or urine tests. You might need bladder or urine tests to help determine what is causing your overactive bladder. You might also need to see a health care provider who specializes in urinary tract problems (urologist). How is this treated? Treatment for overactive bladder depends on the cause of your condition and whether it is mild or severe. You can also make lifestyle changes at home. Options include:  Bladder training. This may include: ? Learning to control the urge to urinate by following a schedule that directs you to urinate at regular intervals (timed voiding). ? Doing Kegel exercises to strengthen your pelvic floor muscles, which support your bladder. Toning these muscles can help you control urination, even if your bladder muscles are overactive.  Special devices. This may include: ? Biofeedback, which uses sensors to help you become aware of your body's signals. ? Electrical stimulation, which uses electrodes placed inside the body (implanted) or outside the body. These electrodes send gentle pulses of electricity to strengthen the nerves or muscles that control the bladder. ? Women may use a plastic device that fits into the vagina and supports the bladder (pessary).  Medicines. ? Antibiotics to treat bladder infection. ? Antispasmodics to stop the bladder from releasing urine at the wrong time. ? Tricyclic antidepressants to relax bladder muscles. ? Injections of botulinum toxin type A directly into the bladder tissue to relax bladder muscles.  Lifestyle changes. This may include: ? Weight loss. Talk to your health  care provider about weight loss methods that would work best for you. ? Diet changes. This may include reducing how much alcohol and caffeine you consume, or drinking fluids at different times of the day. ? Not smoking. Do not use any  products that contain nicotine or tobacco, such as cigarettes and e-cigarettes. If you need help quitting, ask your health care provider.  Surgery. ? A device may be implanted to help manage the nerve signals that control urination. ? An electrode may be implanted to stimulate electrical signals in the bladder. ? A procedure may be done to change the shape of the bladder. This is done only in very severe cases. Follow these instructions at home: Lifestyle  Make any diet or lifestyle changes that are recommended by your health care provider. These may include: ? Drinking less fluid or drinking fluids at different times of the day. ? Cutting down on caffeine or alcohol. ? Doing Kegel exercises. ? Losing weight if needed. ? Eating a healthy and balanced diet to prevent constipation. This may include:  Eating foods that are high in fiber, such as fresh fruits and vegetables, whole grains, and beans.  Limiting foods that are high in fat and processed sugars, such as fried and sweet foods. General instructions  Take over-the-counter and prescription medicines only as told by your health care provider.  If you were prescribed an antibiotic medicine, take it as told by your health care provider. Do not stop taking the antibiotic even if you start to feel better.  Use any implants or pessary as told by your health care provider.  If needed, wear pads to absorb urine leakage.  Keep a journal or log to track how much and when you drink and when you feel the need to urinate. This will help your health care provider monitor your condition.  Keep all follow-up visits as told by your health care provider. This is important. Contact a health care provider if:  You have a fever.  Your symptoms do not get better with treatment.  Your pain and discomfort get worse.  You have more frequent urges to urinate. Get help right away if:  You are not able to control your  bladder. Summary  Overactive bladder refers to a condition in which a person has a sudden need to pass urine.  Several conditions may lead to an overactive bladder.  Treatment for overactive bladder depends on the cause and severity of your condition.  Follow your health care provider's instructions about lifestyle changes, doing Kegel exercises, keeping a journal, and taking medicines. This information is not intended to replace advice given to you by your health care provider. Make sure you discuss any questions you have with your health care provider. Document Revised: 11/18/2018 Document Reviewed: 08/13/2017 Elsevier Patient Education  2020 [employer].

## 2020-05-25 ENCOUNTER — Ambulatory Visit: Payer: 59 | Admitting: Psychology

## 2020-05-26 LAB — NUSWAB BV AND CANDIDA, NAA
Candida albicans, NAA: NEGATIVE
Candida glabrata, NAA: NEGATIVE

## 2020-05-26 IMAGING — CT CT RENAL STONE PROTOCOL
2 of 4 series · 16 of 46 positions shown, 18 images · non-contrast
Comparison: CT 11/27/2017

CLINICAL DATA: Flank pain, stone disease suspected, concern for
pyelo also rule out urolithiasis

EXAM:
CT ABDOMEN AND PELVIS WITHOUT CONTRAST
TECHNIQUE: Multidetector CT imaging of the abdomen and pelvis was performed
following the standard protocol without IV contrast.

[Series 3: axial st · axial · 0.71mm/px · z∈[-172,+258]mm · 13 of 98 slices shown, 15 images]
[im 6/98  soft-tissue]
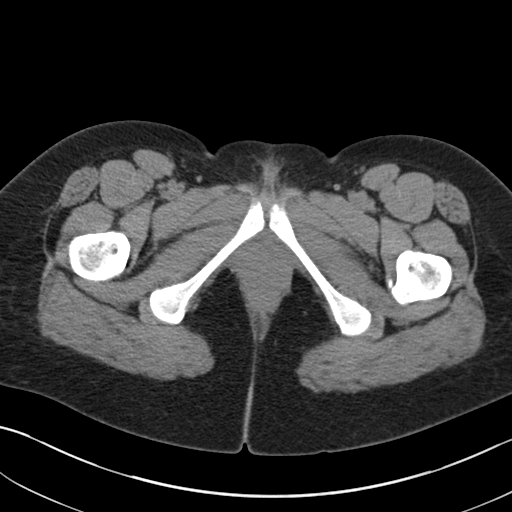
[im 6/98  bone]
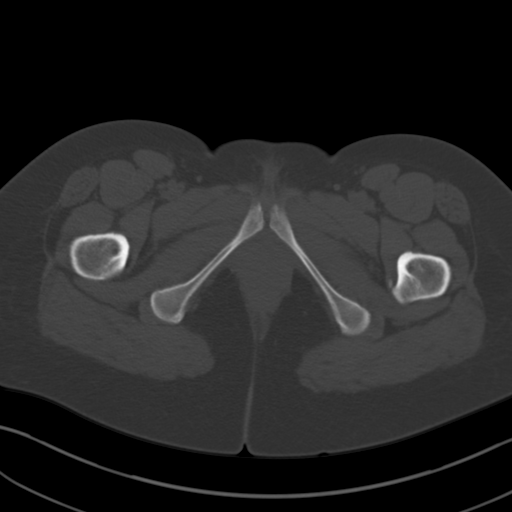
[im 16/98  soft-tissue]
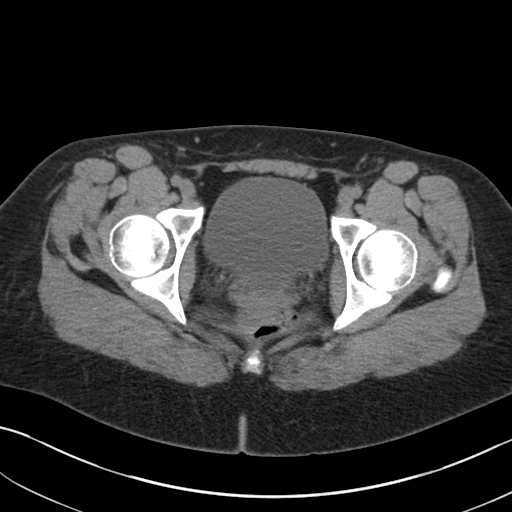
[im 21/98  soft-tissue]
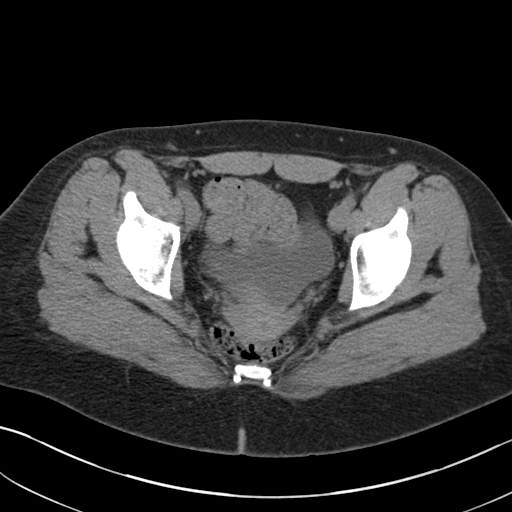
[im 26/98  soft-tissue]
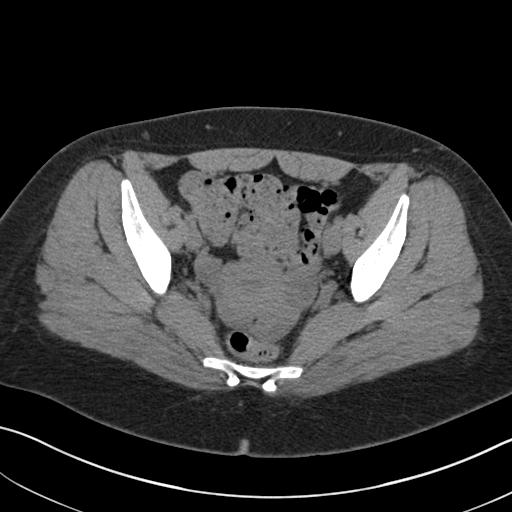
[im 36/98  soft-tissue]
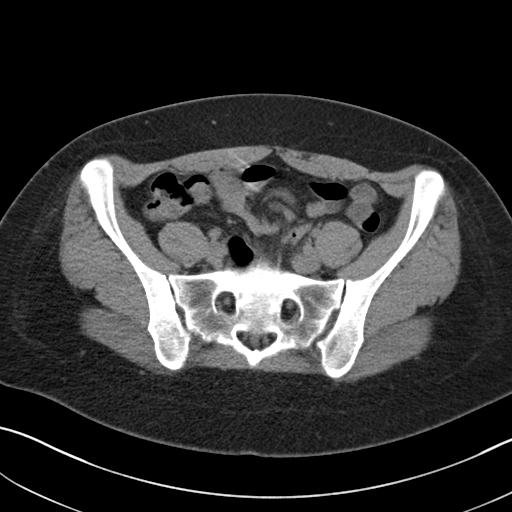
[im 41/98  soft-tissue]
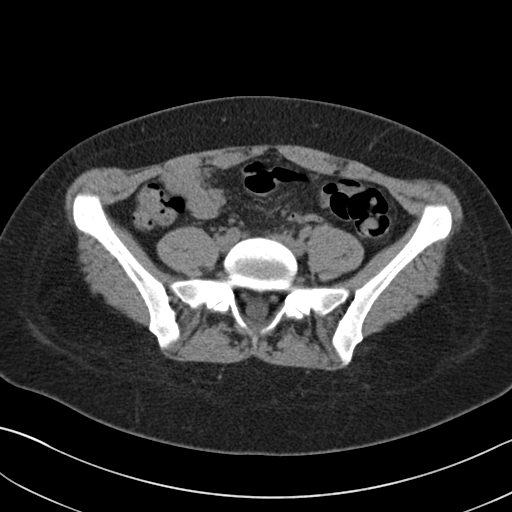
[im 52/98  soft-tissue]
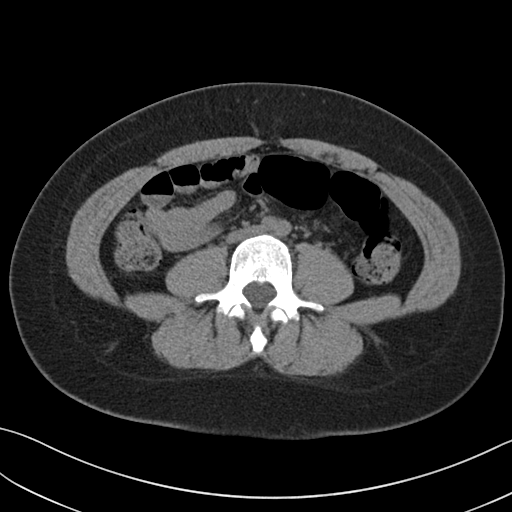
[im 57/98  soft-tissue]
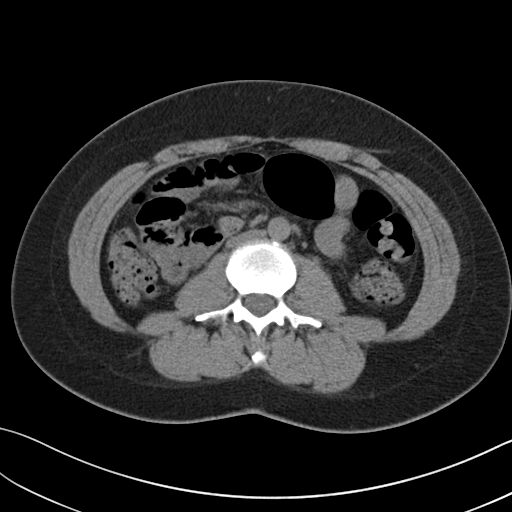
[im 62/98  soft-tissue]
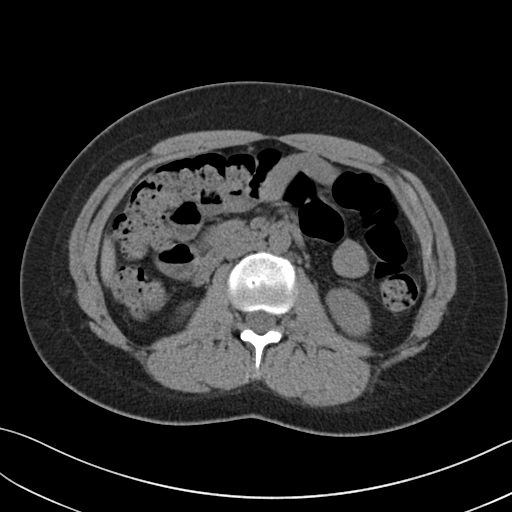
[im 62/98  bone]
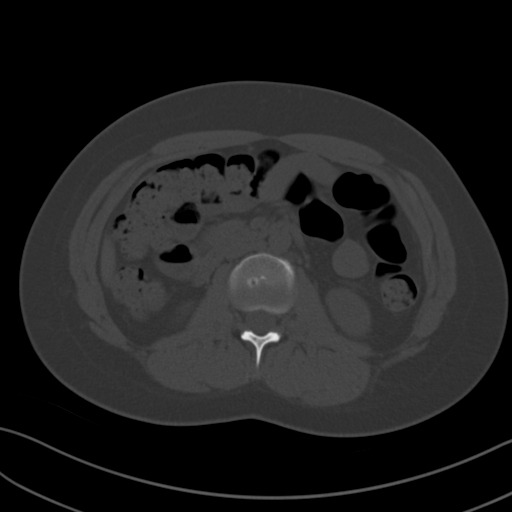
[im 72/98  soft-tissue]
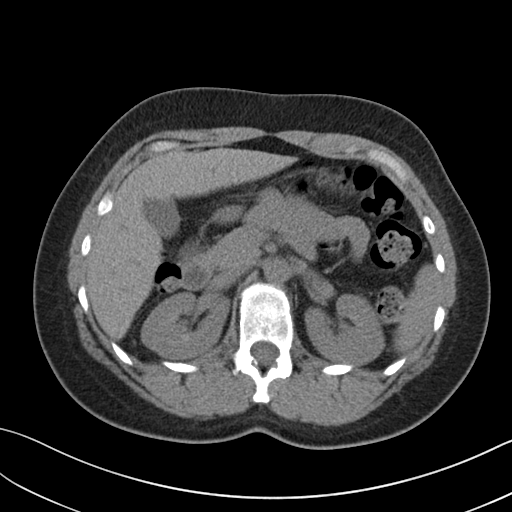
[im 77/98  soft-tissue]
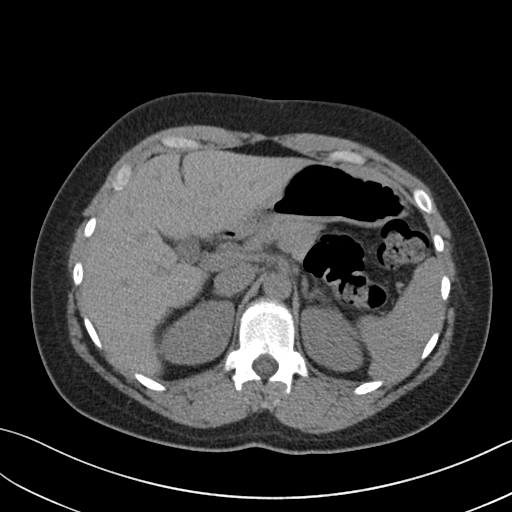
[im 82/98  soft-tissue]
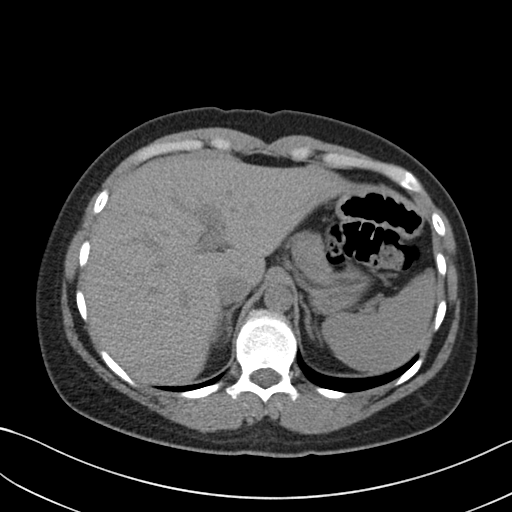
[im 92/98  soft-tissue]
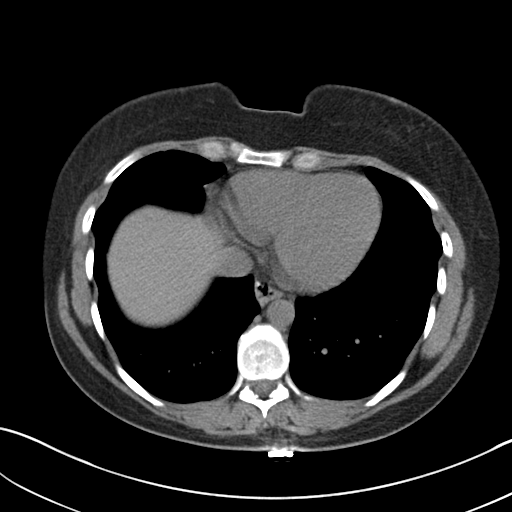

[Series 5: coronal · coronal · 0.72mm/px · 3 of 136 slices shown]
[im 46/136  soft-tissue]
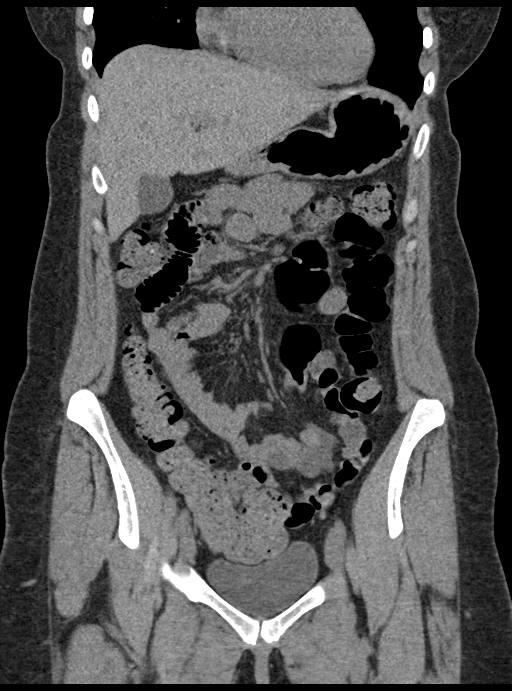
[im 61/136  soft-tissue]
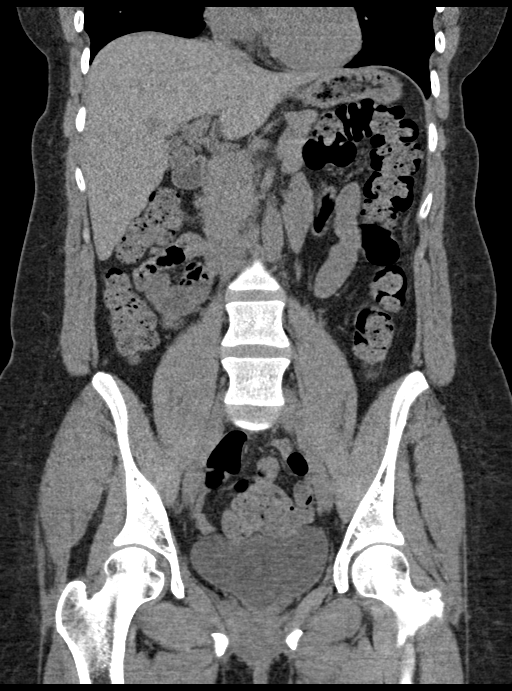
[im 76/136  soft-tissue]
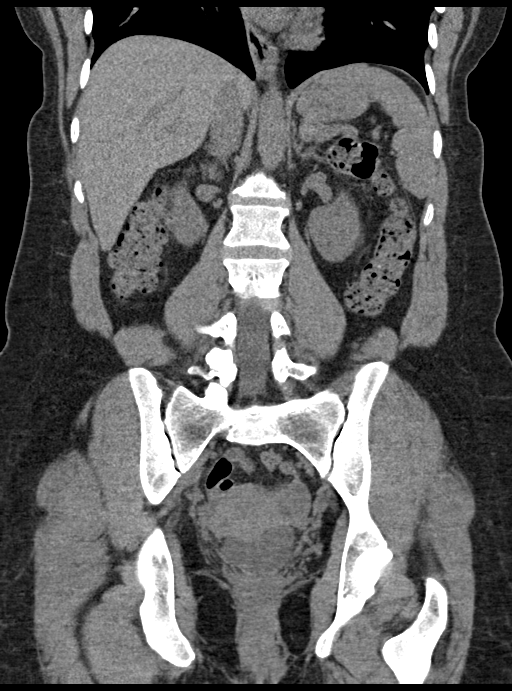

[16 of 46 positions shown; findings below may reference images not displayed]

FINDINGS: Lower chest: Lung bases are clear. Normal heart size. No pericardial
effusion.

Hepatobiliary: No focal liver abnormality is seen. No gallstones,
gallbladder wall thickening, or biliary dilatation.

Pancreas: Unremarkable. No pancreatic ductal dilatation or
surrounding inflammatory changes.

Spleen: Normal in size without focal abnormality.

Adrenals/Urinary Tract: Adrenal glands are unremarkable. Kidneys are
normal, without perinephric stranding, renal calculi, focal lesion,
or hydronephrosis. Physiologic distension of the otherwise
unremarkable urinary bladder.

Stomach/Bowel: Distal esophagus, stomach and duodenal sweep are
unremarkable. No small bowel wall thickening or dilatation. No
evidence of obstruction. A normal retrocecal appendix is best
visualized on sagittal imaging. No colonic dilatation or wall
thickening.

Vascular/Lymphatic: The aorta is normal caliber. No suspicious or
enlarged lymph nodes in the included lymphatic chains.

Reproductive: Normal appearance of the uterus and adnexal
structures.

Other: No abdominopelvic free fluid or free gas. No bowel containing
hernias.

Musculoskeletal: No acute osseous abnormality or suspicious osseous
lesion. Multilevel Schmorl's node formation seen in the lower
thoracic and lumbar spine.
IMPRESSION: No acute intra-abdominal process. Specifically, no gross urinary
tract abnormality is seen.

## 2020-05-31 ENCOUNTER — Other Ambulatory Visit: Payer: Self-pay

## 2020-05-31 ENCOUNTER — Ambulatory Visit (INDEPENDENT_AMBULATORY_CARE_PROVIDER_SITE_OTHER): Payer: Managed Care, Other (non HMO) | Admitting: Neurology

## 2020-05-31 DIAGNOSIS — R519 Headache, unspecified: Secondary | ICD-10-CM

## 2020-05-31 DIAGNOSIS — G4720 Circadian rhythm sleep disorder, unspecified type: Secondary | ICD-10-CM

## 2020-05-31 DIAGNOSIS — R0683 Snoring: Secondary | ICD-10-CM | POA: Diagnosis not present

## 2020-05-31 DIAGNOSIS — G4700 Insomnia, unspecified: Secondary | ICD-10-CM

## 2020-05-31 DIAGNOSIS — Z72820 Sleep deprivation: Secondary | ICD-10-CM

## 2020-05-31 DIAGNOSIS — E663 Overweight: Secondary | ICD-10-CM

## 2020-05-31 DIAGNOSIS — R5383 Other fatigue: Secondary | ICD-10-CM

## 2020-06-01 ENCOUNTER — Ambulatory Visit: Payer: 59 | Admitting: Psychology

## 2020-06-08 ENCOUNTER — Ambulatory Visit (INDEPENDENT_AMBULATORY_CARE_PROVIDER_SITE_OTHER): Payer: 59 | Admitting: Psychology

## 2020-06-08 DIAGNOSIS — F1910 Other psychoactive substance abuse, uncomplicated: Secondary | ICD-10-CM | POA: Diagnosis not present

## 2020-06-14 NOTE — Procedures (Signed)
PATIENT'S NAME:  Robyn Long, Robyn Long DOB:      Mar 23, 1986      MR#:    [id number]     DATE OF RECORDING: 05/31/2020 REFERRING M.D.:  Cheryll Cockayne, MD Study Performed:   Baseline Polysomnogram HISTORY: 34 year old woman with aing medical history of neck pain, back pain, arthritis, allergies and mildly overweight state, who presents for a laboratory attended sleep study.  She continues to have difficulty initiating and maintaining sleep.  She had a home sleep test on 01/11/2020, which was negative for underlying obstructive sleep disordered breathing. The patient endorsed the Epworth Sleepiness Scale at 3 points. The patient's weight 203 pounds with a height of 71 (inches), resulting in a BMI of 28.4 kg/m2.   CURRENT MEDICATIONS: Magnesium, Womens multivitamin, Rozerem, Vitamin D   PROCEDURE:  This is a multichannel digital polysomnogram utilizing the Somnostar 11.2 system.  Electrodes and sensors were applied and monitored per AASM Specifications.   EEG, EOG, Chin and Limb EMG, were sampled at 200 Hz.  ECG, Snore and Nasal Pressure, Thermal Airflow, Respiratory Effort, CPAP Flow and Pressure, Oximetry was sampled at 50 Hz. Digital video and audio were recorded.      BASELINE STUDY  Lights Out was at 21:57 and Lights On at 05:01.  Total recording time (TRT) was 424.5 minutes, with a total sleep time (TST) of 336 minutes.   The patient's sleep latency was 12 minutes.  REM latency was 191 minutes, which is delayed. The sleep efficiency was 79.2%, which is mildly reduced.     SLEEP ARCHITECTURE: WASO (Wake after sleep onset) was 82 minutes with mild to moderate sleep fragmentation noted. There were 27.5 minutes in Stage N1, 207.5 minutes Stage N2, 47 minutes Stage N3 and 54 minutes in Stage REM.  The percentage of Stage N1 was 8.2%, Stage N2 was 61.8%, which is mildly increased, Stage N3 was 14.%, which is near-normal, and Stage R (REM sleep) was 16.1%, which is mildly reduced. The arousals were noted as: 60  were spontaneous, 0 were associated with PLMs, 0 were associated with respiratory events.  RESPIRATORY ANALYSIS:  There were a total of 1 respiratory events:  0 obstructive apneas, 0 central apneas and 0 mixed apneas with a total of 0 apneas and an apnea index (AI) of 0 /hour. There were 1 hypopneas with a hypopnea index of .2 /hour. The patient also had 0 respiratory event related arousals (RERAs).      The total APNEA/HYPOPNEA INDEX (AHI) was .2/hour and the total RESPIRATORY DISTURBANCE INDEX was  .2 /hour.  1 events occurred in REM sleep and 0 events in NREM. The REM AHI was  1.1 /hour, versus a non-REM AHI of 0. The patient spent 182 minutes of total sleep time in the supine position and 154 minutes in non-supine.. The supine AHI was 0.3 versus a non-supine AHI of 0.0.  OXYGEN SATURATION & C02:  The Wake baseline 02 saturation was 98%, with the lowest being 91%. Time spent below 89% saturation equaled 0 minutes.  PERIODIC LIMB MOVEMENTS: The patient had a total of 0 Periodic Limb Movements.  The Periodic Limb Movement (PLM) index was 0 and the PLM Arousal index was 0/hour.  Audio and video analysis did not show any abnormal or unusual movements, behaviors, phonations or vocalizations. The patient took no bathroom breaks. Intermittent mild to moderate snoring was noted. The EKG was in keeping with normal sinus rhythm (NSR).  Post-study, the patient indicated that sleep was the same or slightly better  than usual.   IMPRESSION:  1. Primary Snoring 2. Dysfunctions associated with sleep stages or arousal from sleep  RECOMMENDATIONS:  1. This study does not demonstrate any significant obstructive or central sleep disordered breathing, with the exception of mild to moderate intermittent snoring.  Some weight loss and avoiding the supine sleep position may alleviate her snoring.  2. This study does not support an intrinsic sleep disorder as a cause of the patient's symptoms. Other causes,  including circadian rhythm disturbances, an underlying mood disorder, medication effect and/or an underlying medical problem cannot be ruled out. 3. This study shows a mildly reduced sleep efficiency and mild to moderate sleep fragmentation and abnormal sleep stage percentages; these are nonspecific findings and per se do not signify an intrinsic sleep disorder or a cause for the patient's sleep-related symptoms.  Causes include (but are not limited to) the first night effect of the sleep study, circadian rhythm disturbances, medication effect or an underlying mood disorder or medical problem.  The patient did achieve all stages of sleep. 4. The patient should be cautioned not to drive, work at heights, or operate dangerous or heavy equipment when tired or sleepy. Review and reiteration of good sleep hygiene measures should be pursued with any patient. 5. The patient will be advised to follow up with the referring provider, who will be notified of the test results.  She can follow-up in sleep clinic as needed.  I certify that I have reviewed the entire raw data recording prior to the issuance of this report in accordance with the Standards of Accreditation of the American Academy of Sleep Medicine (AASM)   Huston Foley, MD, PhD Diplomat, American Board of Neurology and Sleep Medicine (Neurology and Sleep Medicine)

## 2020-06-14 NOTE — Progress Notes (Signed)
Patient was last seen in sleep clinic on 05/08/2020.she had a negative home sleep test in June 2021.  She had a laboratory attended sleep study on 05/31/2020.    Please call and notify the patient that the recent sleep study did not show any significant obstructive sleep apnea.  She had intermittent snoring, in the mild to moderate range.  Some degree of weight loss and avoiding the supine sleep position may alleviate her snoring.  She was able to achieve all stages of sleep, but sleep was interrupted.  She fell asleep fairly quickly and slept almost 80% of the time tested.  She reported that she slept the same or perhaps slightly better than usual. At this juncture, I recommend that she follow-up with her primary care physician and discuss referral to a psychiatrist, if the Rozerem is no longer helping.  We also talked about her getting blood work done for B12 deficiency, vitamin D deficiency, and thyroid disease, she reported that she had blood work through her GYN.

## 2020-06-18 ENCOUNTER — Telehealth: Payer: Self-pay

## 2020-06-18 NOTE — Telephone Encounter (Signed)
-----   Message from Huston Foley, MD sent at 06/14/2020  6:35 PM EDT ----- Patient was last seen in sleep clinic on 05/08/2020.she had a negative home sleep test in June 2021.  She had a laboratory attended sleep study on 05/31/2020.    Please call and notify the patient that the recent sleep study did not show any significant obstructive sleep apnea.  She had intermittent snoring, in the mild to moderate range.  Some degree of weight loss and avoiding the supine sleep position may alleviate her snoring.  She was able to achieve all stages of sleep, but sleep was interrupted.  She fell asleep fairly quickly and slept almost 80% of the time tested.  She reported that she slept the same or perhaps slightly better than usual. At this juncture, I recommend that she follow-up with her primary care physician and discuss referral to a psychiatrist, if the Rozerem is no longer helping.  We also talked about her getting blood work done for B12 deficiency, vitamin D deficiency, and thyroid disease, she reported that she had blood work through her GYN.

## 2020-06-18 NOTE — Telephone Encounter (Signed)
Pt returned my call. I discuss her sleep study results and recommendations with her. She will follow up with Dr. Lawerance Bach and her OBGYN. Pt verbalized understanding of results. Pt had no questions at this time but was encouraged to call back if questions arise.

## 2020-06-18 NOTE — Telephone Encounter (Signed)
I called pt to discuss her sleep study results. No answer, left a message asking her to call me back. 

## 2020-06-22 ENCOUNTER — Ambulatory Visit (INDEPENDENT_AMBULATORY_CARE_PROVIDER_SITE_OTHER): Payer: 59 | Admitting: Psychology

## 2020-06-22 DIAGNOSIS — F332 Major depressive disorder, recurrent severe without psychotic features: Secondary | ICD-10-CM | POA: Diagnosis not present

## 2020-06-25 NOTE — Progress Notes (Signed)
34 y.o. G0P0000 Married White or Caucasian Not Hispanic or Latino female here for annual exam.   Menses q month x 5 days (last 2 cycles lasted x 3 days). She saturates a super tampon in up to 2 hours. No BTB. No cramps. No dyspareunia. Mostly using condoms. Plans to get pregnant, just cleared by the hip surgeon.  She is on PNV. Last year Rubella screen showed immunity. She has had chicken pox.     She was seen last month with overactive bladder symptoms, negative urine culture. Negative vaginitis panel at the same visit, done secondary to c/o vaginal odor.  Her bladder symptoms improved since her last visit, then recurred with her cycle lasted for 3 days. Stopped caffeine and ETOH secondary to her sleep issues.   Patient's last menstrual period was 06/10/2020 (approximate).          Sexually active: Yes.    The current method of family planning is condoms most of the time.    Exercising: Yes.    walk, swim, yoga Smoker:  no  Health Maintenance: Pap: 08/21/17 at [facility], WNL, negative HPV. 11/13/2016 WNL NEG HPV, 07-18-15 neg HPV HR neg, 2015 neg, cryo 2014 History of abnormal Pap:  yes MMG:  06/28/19 density C Bi-rads 1 neg  BMD:   None  Colonoscopy: none  TDaP:  05/17/19  Gardasil: none    reports that she has never smoked. She has never used smokeless tobacco. She reports current alcohol use of about 2.0 standard drinks of alcohol per week. She reports that she does not use drugs. Teaches dance.  Past Medical History:  Diagnosis Date  . Abnormal Pap smear of cervix 2014  . Allergy   . Arthritis   . Depression   . History of chicken pox     Past Surgical History:  Procedure Laterality Date  . BREAST REDUCTION SURGERY Bilateral 02/2020  . CRYOTHERAPY  2014   for abnormal pap smear  . HIP SURGERY     right hip    Current Outpatient Medications  Medication Sig Dispense Refill  . MAGNESIUM PO Take by mouth.    . Multiple Vitamins-Minerals (WOMENS MULTIVITAMIN PLUS) TABS Take 1  tablet by mouth daily.    Marland Kitchen VITAMIN D PO Take by mouth.     No current facility-administered medications for this visit.    Family History  Problem Relation Age of Onset  . Mitral valve prolapse Father   . Hypertension Mother   . Colon polyps Mother   . Lymphoma Mother   . Breast cancer Maternal Aunt 40       deceased from breast cancer  . Lung cancer Paternal Grandfather   . Mitral valve prolapse Paternal Aunt   . Breast cancer Paternal Aunt        over 69  . Lung cancer Maternal Grandfather   . Mitral valve prolapse Paternal Aunt   . Breast cancer Paternal Aunt        over 13   . Colon cancer Neg Hx     Review of Systems  All other systems reviewed and are negative.   Exam:   BP 138/80   Pulse 74   Resp 12   Ht 5\' 11"  (1.803 m)   Wt 201 lb (91.2 kg)   LMP 06/10/2020 (Approximate)   SpO2 99%   BMI 28.03 kg/m   Weight change: @WEIGHTCHANGE @ Height:   Height: 5\' 11"  (180.3 cm)  Ht Readings from Last 3 Encounters:  06/26/20 5'  11" (1.803 m)  05/24/20 5\' 11"  (1.803 m)  05/08/20 5\' 11"  (1.803 m)    General appearance: alert, cooperative and appears stated age Head: Normocephalic, without obvious abnormality, atraumatic Neck: no adenopathy, supple, symmetrical, trachea midline and thyroid normal to inspection and palpation Lungs: clear to auscultation bilaterally Cardiovascular: regular rate and rhythm Breasts: normal appearance, no masses or tenderness Abdomen: soft, non-tender; non distended,  no masses,  no organomegaly Extremities: extremities normal, atraumatic, no cyanosis or edema Skin: Skin color, texture, turgor normal. No rashes or lesions Lymph nodes: Cervical, supraclavicular, and axillary nodes normal. No abnormal inguinal nodes palpated Neurologic: Grossly normal   Pelvic: External genitalia:  no lesions              Urethra:  normal appearing urethra with no masses, tenderness or lesions              Bartholins and Skenes: normal                  Vagina: normal appearing vagina with normal color and discharge, no lesions              Cervix: no lesions               Bimanual Exam:  Uterus:  normal size, contour, position, consistency, mobility, non-tender              Adnexa: no mass, fullness, tenderness               Rectovaginal: Confirms               Anus:  normal sphincter tone, no lesions  Shanon Petty chaperoned for the exam.  A:  Well Woman with normal exam  Planning to get pregnant  OAB symptoms intermittently, tolerable  P:   No pap this year  Discussed breast self exam  Discussed calcium and vit D intake  No labs this year  On PNV  Information on prepregnancy, AMA, pregnancy given

## 2020-06-26 ENCOUNTER — Encounter: Payer: Self-pay | Admitting: Obstetrics and Gynecology

## 2020-06-26 ENCOUNTER — Other Ambulatory Visit: Payer: Self-pay

## 2020-06-26 ENCOUNTER — Ambulatory Visit (INDEPENDENT_AMBULATORY_CARE_PROVIDER_SITE_OTHER): Payer: Managed Care, Other (non HMO) | Admitting: Obstetrics and Gynecology

## 2020-06-26 VITALS — BP 138/80 | HR 74 | Resp 12 | Ht 71.0 in | Wt 201.0 lb

## 2020-06-26 DIAGNOSIS — Z01419 Encounter for gynecological examination (general) (routine) without abnormal findings: Secondary | ICD-10-CM

## 2020-06-26 DIAGNOSIS — Z3169 Encounter for other general counseling and advice on procreation: Secondary | ICD-10-CM | POA: Diagnosis not present

## 2020-06-26 NOTE — Patient Instructions (Signed)
Pregnancy After Age 35 Women who become pregnant after the age of 20 have a higher risk for certain problems during pregnancy. This is because older women may already have health problems before becoming pregnant. Older women who are healthy before pregnancy may still develop problems during pregnancy. These problems may affect the mother, the unborn baby (fetus), or both. What are the risks for me? If you are over age 15 and you want to become pregnant or are pregnant, you may have a higher risk of:  Not being able to get pregnant (infertility).  Going into labor early (preterm labor).  Needing surgical delivery of your baby (cesarean delivery, or C-section).  Having high blood pressure (hypertension).  Having complications during pregnancy, such as high blood pressure and other symptoms (preeclampsia).  Having diabetes during pregnancy (gestational diabetes).  Being pregnant with more than one baby.  Loss of the unborn baby before 20 weeks (miscarriage) or after 20 weeks of pregnancy (stillbirth). What are the risks for my baby? Babies born to women over the age of 21 have a higher risk for:  Being born early (prematurity).  Low birth weight, which is less than 5 lb, 8 oz (2.5 kg).  Birth defects, such as Down syndrome and cleft palate.  Health complications, including problems with growth and development. How is prenatal care different for women over age 59? All women should see their health care provider before they try to become pregnant. This is especially important for women over the age of 7. Tell your health care provider about:  Any health problems you have.  Any medicines you take.  Any family history of health problems or chromosome-related defects.  Any problems you have had with past pregnancies or deliveries. If you are over age 45 and you plan to become pregnant:  Start taking a daily multivitamin a month or more before you try to get pregnant. Your  multivitamin should contain 400 mcg (micrograms) of folic acid. If you are over age 24 and pregnant, make sure you:  Keep taking your multivitamin unless your health care provider tells you not to take it.  Keep all prenatal visits as told by your health care provider. This is important.  Have ultrasounds regularly throughout your pregnancy to check for problems.  Talk with your health care provider about other prenatal screening tests that you may need. What additional prenatal tests are needed? Screening tests show whether your baby has a higher risk for birth defects than other babies. Screening tests include:  Ultrasound tests to look for markers that indicate a risk for birth defects.  Maternal blood screening. These are blood tests that measure certain substances in your blood to determine your baby's risk for defects. Screening tests do not show whether your baby has or does not have defects. They only show your baby's risk for certain defects. If your screening tests show that risk factors are present, you may need tests to confirm the defect (diagnostic testing). These tests may include:  Chorionic villus sampling. For this procedure, a tissue sample is taken from the organ that forms in your uterus to nourish your baby (placenta). The sample is removed through your cervix or abdomen and tested.  Amniocentesis. For this procedure, a small amount of the fluid that surrounds the baby in the uterus (amniotic fluid) is removed and tested. What can I do to stay healthy during my pregnancy? Staying healthy during pregnancy can help you and your baby to have a lower risk for  problems during pregnancy, during delivery, or both. Talk with your health care provider for specific instructions about staying healthy during your pregnancy. Nutrition   At each meal, eat a variety of foods from each of the five food groups. These groups include: ? Proteins such as lean meats, poultry, fish that is  low in fat, beans, eggs, and nuts. ? Vegetables such as leafy greens, raw and cooked vegetables, and vegetable juice. ? Fruits that are fresh, frozen, or canned, or 100% fruit juice. ? Dairy products such as low-fat yogurt, cheese, and milk. ? Whole grains including rice, cereal, pasta, and bread.  Talk with your health care provider about how much food in each group is right for you.  Follow instructions from your health care provider about eating and drinking restrictions during pregnancy. ? Do not eat raw eggs, raw meat, or raw fish or seafood. ? Do not eat any fish that contains high amounts of mercury, such as swordfish or mackerel.  Drink 6-8 or more glasses of water a day. You should drink enough fluid to keep your urine pale yellow. Managing weight gain  Ask your health care provider how much weight gain is healthy during pregnancy.  Stay at a healthy weight. If needed, work with your health care provider to lose weight safely. Activity  Exercise regularly, as directed by your health care provider. Ask your health care provider what forms of exercise are safe for you. General instructions  Do not use any products that contain nicotine or tobacco, such as cigarettes and e-cigarettes. If you need help quitting, ask your health care provider.  Do not drink alcohol, use drugs, or abuse prescription medicine.  Take over-the-counter and prescription medicines only as told by your health care provider.  Do not use hot tubs, steam rooms, or saunas.  Talk with your health care provider about your risk of exposure to harmful environmental conditions. This includes exposure to chemicals, radiation, cleaning products, and cat feces. Follow advice from your health care provider about how to limit your exposure. Summary  Women who become pregnant after the age of 81 have a higher risk for complications during pregnancy.  Problems may affect the mother, the unborn baby (fetus), or  both.  All women should see their health care provider before they try to become pregnant. This is especially important for women over the age of 98.  Staying healthy during pregnancy can help both you and your baby to have a lower risk for some of the problems that can happen during pregnancy, during delivery, or both. This information is not intended to replace advice given to you by your health care provider. Make sure you discuss any questions you have with your health care provider. Document Revised: 11/19/2018 Document Reviewed: 11/17/2016 Elsevier Patient Education  [place]. Common Medications Safe in Pregnancy  Acne:      Constipation:  Benzoyl Peroxide     Colace  Clindamycin      Dulcolax Suppository  Topica Erythromycin     Fibercon  Salicylic Acid      Metamucil         Miralax AVOID:        Senakot   Accutane    Cough:  Retin-A       Cough Drops  Tetracycline      Phenergan w/ Codeine if Rx  Minocycline      Robitussin (Plain & DM)  Antibiotics:     Crabs/Lice:  Ceclor  RID  Cephalosporins    AVOID:  E-Mycins      Kwell  Keflex  Macrobid/Macrodantin   Diarrhea:  Penicillin      Kao-Pectate  Zithromax      Imodium AD         PUSH FLUIDS AVOID:       Cipro     Fever:  Tetracycline      Tylenol (Regular or Extra  Minocycline       Strength)  Levaquin      Extra Strength-Do not          Exceed 8 tabs/24 hrs Caffeine:        '200mg'$ /day (equiv. To 1 cup of coffee or  approx. 3 12 oz sodas)         Gas: Cold/Hayfever:       Gas-X  Benadryl      Mylicon  Claritin       Phazyme  **Claritin-D        Chlor-Trimeton    Headaches:  Dimetapp      ASA-Free Excedrin  Drixoral-Non-Drowsy     Cold Compress  Mucinex (Guaifenasin)     Tylenol (Regular or Extra  Sudafed/Sudafed-12 Hour     Strength)  **Sudafed PE Pseudoephedrine   Tylenol Cold & Sinus     Vicks Vapor Rub  Zyrtec  **AVOID if Problems With Blood Pressure         Heartburn: Avoid  lying down for at least 1 hour after meals  Aciphex      Maalox     Rash:  Milk of Magnesia     Benadryl    Mylanta       1% Hydrocortisone Cream  Pepcid  Pepcid Complete   Sleep Aids:  Prevacid      Ambien   Prilosec       Benadryl  Rolaids       Chamomile Tea  Tums (Limit 4/day)     Unisom         Tylenol PM         Warm milk-add vanilla or  Hemorrhoids:       Sugar for taste  Anusol/Anusol H.C.  (RX: Analapram 2.5%)  Sugar Substitutes:  Hydrocortisone OTC     Ok in moderation  Preparation H      Tucks        Vaseline lotion applied to tissue with wiping    Herpes:     Throat:  Acyclovir      Oragel  Famvir  Valtrex     Vaccines:         Flu Shot Leg Cramps:       *Gardasil  Benadryl      Hepatitis A         Hepatitis B Nasal Spray:       Pneumovax  Saline Nasal Spray     Polio Booster         Tetanus Nausea:       Tuberculosis test or PPD  Vitamin B6 25 mg TID   AVOID:    Dramamine      *Gardasil  Emetrol       Live Poliovirus  Ginger Root 250 mg QID    MMR (measles, mumps &  High Complex Carbs @ Bedtime    rebella)  Sea Bands-Accupressure    Varicella (Chickenpox)  Unisom 1/2 tab TID     *No known complications           If  received before Pain:         Known pregnancy;   Darvocet       Resume series after  Lortab        Delivery  Percocet    Yeast:   Tramadol      Femstat  Tylenol 3      Gyne-lotrimin  Ultram       Monistat  Vicodin           MISC:         All Sunscreens           Hair Coloring/highlights          Insect Repellant's          (Including DEET)         Mystic Tans Common Medications Safe in Pregnancy  Acne:      Constipation:  Benzoyl Peroxide     Colace  Clindamycin      Dulcolax Suppository  Topica Erythromycin     Fibercon  Salicylic Acid      Metamucil         Miralax AVOID:        Senakot   Accutane    Cough:  Retin-A       Cough Drops  Tetracycline      Phenergan w/ Codeine if Rx  Minocycline      Robitussin (Plain &  DM)  Antibiotics:     Crabs/Lice:  Ceclor       RID  Cephalosporins    AVOID:  E-Mycins      Kwell  Keflex  Macrobid/Macrodantin   Diarrhea:  Penicillin      Kao-Pectate  Zithromax      Imodium AD         PUSH FLUIDS AVOID:       Cipro     Fever:  Tetracycline      Tylenol (Regular or Extra  Minocycline       Strength)  Levaquin      Extra Strength-Do not          Exceed 8 tabs/24 hrs Caffeine:        '200mg'$ /day (equiv. To 1 cup of coffee or  approx. 3 12 oz sodas)         Gas: Cold/Hayfever:       Gas-X  Benadryl      Mylicon  Claritin       Phazyme  **Claritin-D        Chlor-Trimeton    Headaches:  Dimetapp      ASA-Free Excedrin  Drixoral-Non-Drowsy     Cold Compress  Mucinex (Guaifenasin)     Tylenol (Regular or Extra  Sudafed/Sudafed-12 Hour     Strength)  **Sudafed PE Pseudoephedrine   Tylenol Cold & Sinus     Vicks Vapor Rub  Zyrtec  **AVOID if Problems With Blood Pressure         Heartburn: Avoid lying down for at least 1 hour after meals  Aciphex      Maalox     Rash:  Milk of Magnesia     Benadryl    Mylanta       1% Hydrocortisone Cream  Pepcid  Pepcid Complete   Sleep Aids:  Prevacid      Ambien   Prilosec       Benadryl  Rolaids       Chamomile Tea  Tums (Limit 4/day)     Unisom  Tylenol PM         Warm milk-add vanilla or  Hemorrhoids:       Sugar for taste  Anusol/Anusol H.C.  (RX: Analapram 2.5%)  Sugar Substitutes:  Hydrocortisone OTC     Ok in moderation  Preparation H      Tucks        Vaseline lotion applied to tissue with wiping    Herpes:     Throat:  Acyclovir      Oragel  Famvir  Valtrex     Vaccines:         Flu Shot Leg Cramps:       *Gardasil  Benadryl      Hepatitis A         Hepatitis B Nasal Spray:       Pneumovax  Saline Nasal Spray     Polio Booster         Tetanus Nausea:       Tuberculosis test or PPD  Vitamin B6 25 mg TID   AVOID:    Dramamine      *Gardasil  Emetrol       Live Poliovirus  Ginger  Root 250 mg QID    MMR (measles, mumps &  High Complex Carbs @ Bedtime    rebella)  Sea Bands-Accupressure    Varicella (Chickenpox)  Unisom 1/2 tab TID     *No known complications           If received before Pain:         Known pregnancy;   Darvocet       Resume series after  Lortab        Delivery  Percocet    Yeast:   Tramadol      Femstat  Tylenol 3      Gyne-lotrimin  Ultram       Monistat  Vicodin           MISC:         All Sunscreens           Hair Coloring/highlights          Insect Repellant's          (Including DEET)         Mystic Tans Preparing for Pregnancy If you are considering becoming pregnant, make an appointment to see your regular health care provider to learn how to prepare for a safe and healthy pregnancy (preconception care). During a preconception care visit, your health care provider will:  Do a complete physical exam, including a Pap test.  Take a complete medical history.  Give you information, answer your questions, and help you resolve problems. Preconception checklist Medical history  Tell your health care provider about any current or past medical conditions. Your pregnancy or your ability to become pregnant may be affected by chronic conditions, such as diabetes, chronic hypertension, and thyroid problems.  Include your family's medical history as well as your partner's medical history.  Tell your health care provider about any history of STIs (sexually transmitted infections).These can affect your pregnancy. In some cases, they can be passed to your baby. Discuss any concerns that you have about STIs.  If indicated, discuss the benefits of genetic testing. This testing will show whether there are any genetic conditions that may be passed from you or your partner to your baby.  Tell your health care provider about: ? Any problems you have had with conception or pregnancy. ? Any medicines you  take. These include vitamins, herbal supplements,  and over-the-counter medicines. ? Your history of immunizations. Discuss any vaccinations that you may need. Diet  Ask your health care provider what to include in a healthy diet that has a balance of nutrients. This is especially important when you are pregnant or preparing to become pregnant.  Ask your health care provider to help you reach a healthy weight before pregnancy. ? If you are overweight, you may be at higher risk for certain complications, such as high blood pressure, diabetes, and preterm birth. ? If you are underweight, you are more likely to have a baby who has a low birth weight. Lifestyle, work, and home  Let your health care provider know: ? About any lifestyle habits that you have, such as alcohol use, drug use, or smoking. ? About recreational activities that may put you at risk during pregnancy, such as downhill skiing and certain exercise programs. ? Tell your health care provider about any international travel, especially any travel to places with an active [place] virus outbreak. ? About harmful substances that you may be exposed to at work or at home. These include chemicals, pesticides, radiation, or even litter boxes. ? If you do not feel safe at home. Mental health  Tell your health care provider about: ? Any history of mental health conditions, including feelings of depression, sadness, or anxiety. ? Any medicines that you take for a mental health condition. These include herbs and supplements. Home instructions to prepare for pregnancy Lifestyle   Eat a balanced diet. This includes fresh fruits and vegetables, whole grains, lean meats, low-fat dairy products, healthy fats, and foods that are high in fiber. Ask to meet with a nutritionist or registered dietitian for assistance with meal planning and goals.  Get regular exercise. Try to be active for at least 30 minutes a day on most days of the week. Ask your health care provider which activities are safe  during pregnancy.  Do not use any products that contain nicotine or tobacco, such as cigarettes and e-cigarettes. If you need help quitting, ask your health care provider.  Do not drink alcohol.  Do not take illegal drugs.  Maintain a healthy weight. Ask your health care provider what weight range is right for you. General instructions  Keep an accurate record of your menstrual periods. This makes it easier for your health care provider to determine your baby's due date.  Begin taking prenatal vitamins and folic acid supplements daily as directed by your health care provider.  Manage any chronic conditions, such as high blood pressure and diabetes, as told by your health care provider. This is important. How do I know that I am pregnant? You may be pregnant if you have been sexually active and you miss your period. Symptoms of early pregnancy include:  Mild cramping.  Very light vaginal bleeding (spotting).  Feeling unusually tired.  Nausea and vomiting (morning sickness). If you have any of these symptoms and you suspect that you might be pregnant, you can take a home pregnancy test. These tests check for a hormone in your urine (human chorionic gonadotropin, or hCG). A woman's body begins to make this hormone during early pregnancy. These tests are very accurate. Wait until at least the first day after you miss your period to take one. If the test shows that you are pregnant (you get a positive result), call your health care provider to make an appointment for prenatal care. What should I do if I  become pregnant?      Make an appointment with your health care provider as soon as you suspect you are pregnant.  Do not use any products that contain nicotine, such as cigarettes, chewing tobacco, and e-cigarettes. If you need help quitting, ask your health care provider.  Do not drink alcoholic beverages. Alcohol is related to a number of birth defects.  Avoid toxic odors and  chemicals.  You may continue to have sexual intercourse if it does not cause pain or other problems, such as vaginal bleeding. This information is not intended to replace advice given to you by your health care provider. Make sure you discuss any questions you have with your health care provider. Document Revised: 07/30/2017 Document Reviewed: 02/17/2016 Elsevier Patient Education  [place].

## 2020-06-29 ENCOUNTER — Ambulatory Visit (INDEPENDENT_AMBULATORY_CARE_PROVIDER_SITE_OTHER): Payer: Managed Care, Other (non HMO) | Admitting: Plastic Surgery

## 2020-06-29 ENCOUNTER — Encounter: Payer: Self-pay | Admitting: Plastic Surgery

## 2020-06-29 ENCOUNTER — Ambulatory Visit: Payer: 59 | Admitting: Psychology

## 2020-06-29 ENCOUNTER — Other Ambulatory Visit: Payer: Self-pay

## 2020-06-29 VITALS — HR 108 | Temp 97.8°F

## 2020-06-29 DIAGNOSIS — Z9889 Other specified postprocedural states: Secondary | ICD-10-CM

## 2020-06-29 NOTE — Progress Notes (Signed)
The patient is a very sweet 34 year old female here for follow-up on her breast reduction.  She is overall extremely happy and doing very well.  She has a little bit of a dogear on the inframammary fold of the right breast.  She should continue with massage and Steri-Strip in the area.  If it does not resolve in the next few months we can resolve it with excision in the office.  She will let us know.

## 2020-07-02 NOTE — Progress Notes (Signed)
Subjective:    Patient ID: Robyn Long, female    DOB: 10-Feb-1986, 34 y.o.   MRN: [id number]  HPI The patient is here for an acute visit.   Sleep difficulties -  She is still not sleeping.  She may get a couple of hours.  She does not always fall asleep well and between 12 and 5 she is up most of the night.  As a result she is exhausted throughout the day.  She did have her sleep study and does not have any evidence of sleep apnea.  She denies any depression or anxiety.  She has seen a therapist and he agrees she is not clinically depressed or anxious.  She has tried several medications none have been helpful.  She did want to get blood work to evaluate some of her symptoms to make sure something else was going on.  She is currently trying to get pregnant.  She does take her multivitamin daily.  Medications and allergies reviewed with patient and updated if appropriate.  Patient Active Problem List   Diagnosis Date Noted  . S/P bilateral breast reduction 03/11/2020  . Insomnia 09/19/2019  . Back pain 06/21/2019  . Neck pain 06/21/2019  . 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 Trigger point of right shoulder region 10/06/2018  . Right shoulder pain 06/15/2018  . Pain of left calf 12/30/2017  . Labral tear of hip joint 12/09/2017  . Nonallopathic lesion of thoracic region 07/01/2017  . Nonallopathic lesion of sacral region 07/01/2017  . Nonallopathic lesion of lumbosacral region 07/01/2017  . Acute bursitis of right shoulder 06/18/2017  . Scapular dyskinesis 06/18/2017  . Right hip pain 03/23/2017  . Constipation 04/08/2016  . Cervical intraepithelial neoplasia grade 2 02/10/2013  . Knee pain, right anterior 01/06/2013    Current Outpatient Medications on File Prior to Visit  Medication Sig Dispense Refill  . MAGNESIUM PO Take by mouth.    . Multiple Vitamins-Minerals (WOMENS MULTIVITAMIN PLUS) TABS Take 1 tablet by mouth daily.    Marland Kitchen VITAMIN D PO Take by mouth.      No current facility-administered medications on file prior to visit.    Past Medical History:  Diagnosis Date  . Abnormal Pap smear of cervix 2014  . Allergy   . Arthritis   . Depression   . History of chicken pox     Past Surgical History:  Procedure Laterality Date  . BREAST REDUCTION SURGERY Bilateral 02/2020  . CRYOTHERAPY  2014   for abnormal pap smear  . HIP SURGERY     right hip    Social History   Socioeconomic History  . Marital status: Married    Spouse name: Not on file  . Number of children: Not on file  . Years of education: 16+  . Highest education level: Not on file  Occupational History  . Occupation: Associate Professor: [employer]  Tobacco Use  . Smoking status: Never Smoker  . Smokeless tobacco: Never Used  Vaping Use  . Vaping Use: Never used  Substance and Sexual Activity  . Alcohol use: Yes    Alcohol/week: 2.0 standard drinks    Types: 2 Standard drinks or equivalent per week  . Drug use: No  . Sexual activity: Yes    Partners: Male    Birth control/protection: Condom  Other Topics Concern  . Not on file  Social History Narrative   Caffeine Use-yes   Regular exercise-yes  Social Determinants of Health   Financial Resource Strain:   . Difficulty of Paying Living Expenses: Not on file  Food Insecurity:   . Worried About Programme researcher, broadcasting/film/video in the Last Year: Not on file  . Ran Out of Food in the Last Year: Not on file  Transportation Needs:   . Lack of Transportation (Medical): Not on file  . Lack of Transportation (Non-Medical): Not on file  Physical Activity:   . Days of Exercise per Week: Not on file  . Minutes of Exercise per Session: Not on file  Stress:   . Feeling of Stress : Not on file  Social Connections:   . Frequency of Communication with Friends and Family: Not on file  . Frequency of Social Gatherings with Friends and Family: Not on file  . Attends Religious Services: Not on file  .  Active Member of Clubs or Organizations: Not on file  . Attends Banker Meetings: Not on file  . Marital Status: Not on file    Family History  Problem Relation Age of Onset  . Mitral valve prolapse Father   . Hypertension Mother   . Colon polyps Mother   . Lymphoma Mother   . Breast cancer Maternal Aunt 40       deceased from breast cancer  . Lung cancer Paternal Grandfather   . Mitral valve prolapse Paternal Aunt   . Breast cancer Paternal Aunt        over 70  . Lung cancer Maternal Grandfather   . Mitral valve prolapse Paternal Aunt   . Breast cancer Paternal Aunt        over 103   . Colon cancer Neg Hx     Review of Systems  Constitutional: Positive for fatigue. Negative for fever.  Eyes: Positive for visual disturbance (at times).  Respiratory: Negative for cough, shortness of breath and wheezing.   Cardiovascular: Negative for chest pain, palpitations and leg swelling.  Gastrointestinal: Negative for abdominal pain, blood in stool, constipation, diarrhea and nausea.       No gerd  Musculoskeletal: Negative for arthralgias and back pain.  Neurological: Positive for headaches. Negative for dizziness, light-headedness and numbness.  Psychiatric/Behavioral: Positive for sleep disturbance. Negative for dysphoric mood. The patient is not nervous/anxious.        Objective:   Vitals:   07/03/20 1531  BP: 130/82  Pulse: (!) 102  Temp: 98.2 F (36.8 C)  SpO2: 99%   BP Readings from Last 3 Encounters:  07/03/20 130/82  06/26/20 138/80  05/24/20 110/66   Wt Readings from Last 3 Encounters:  07/03/20 202 lb 6.4 oz (91.8 kg)  06/26/20 201 lb (91.2 kg)  05/24/20 204 lb (92.5 kg)   Body mass index is 28.23 kg/m.   Physical Exam    Constitutional: Appears well-developed and well-nourished. No distress.  Head: Normocephalic and atraumatic.  Neck: Neck supple. No tracheal deviation present. No thyromegaly present.  No cervical  lymphadenopathy Cardiovascular: Normal rate, regular rhythm and normal heart sounds.  No murmur heard. No carotid bruit .  No edema Pulmonary/Chest: Effort normal and breath sounds normal. No respiratory distress. No has no wheezes. No rales.  Skin: Skin is warm and dry. Not diaphoretic.  Psychiatric: Normal mood and affect. Behavior is normal.       Assessment & Plan:    See Problem List for Assessment and Plan of chronic medical problems.    This visit occurred during the SARS-CoV-2 public health  emergency.  Safety protocols were in place, including screening questions prior to the visit, additional usage of staff PPE, and extensive cleaning of exam room while observing appropriate contact time as indicated for disinfecting solutions.

## 2020-07-03 ENCOUNTER — Other Ambulatory Visit: Payer: Self-pay

## 2020-07-03 ENCOUNTER — Encounter: Payer: Self-pay | Admitting: Internal Medicine

## 2020-07-03 ENCOUNTER — Ambulatory Visit (INDEPENDENT_AMBULATORY_CARE_PROVIDER_SITE_OTHER): Payer: Managed Care, Other (non HMO) | Admitting: Internal Medicine

## 2020-07-03 VITALS — BP 130/82 | HR 102 | Temp 98.2°F | Ht 71.0 in | Wt 202.4 lb

## 2020-07-03 DIAGNOSIS — R5383 Other fatigue: Secondary | ICD-10-CM | POA: Diagnosis not present

## 2020-07-03 DIAGNOSIS — G4700 Insomnia, unspecified: Secondary | ICD-10-CM | POA: Diagnosis not present

## 2020-07-03 DIAGNOSIS — R739 Hyperglycemia, unspecified: Secondary | ICD-10-CM | POA: Insufficient documentation

## 2020-07-03 DIAGNOSIS — Z8639 Personal history of other endocrine, nutritional and metabolic disease: Secondary | ICD-10-CM | POA: Diagnosis not present

## 2020-07-03 DIAGNOSIS — Z23 Encounter for immunization: Secondary | ICD-10-CM

## 2020-07-03 LAB — COMPREHENSIVE METABOLIC PANEL
ALT: 6 U/L (ref 0–35)
AST: 15 U/L (ref 0–37)
Albumin: 4.5 g/dL (ref 3.5–5.2)
Alkaline Phosphatase: 78 U/L (ref 39–117)
BUN: 13 mg/dL (ref 6–23)
CO2: 28 mEq/L (ref 19–32)
Calcium: 9.6 mg/dL (ref 8.4–10.5)
Chloride: 103 mEq/L (ref 96–112)
Creatinine, Ser: 0.78 mg/dL (ref 0.40–1.20)
GFR: 98.6 mL/min (ref >60.00)
Glucose, Bld: 105 mg/dL — ABNORMAL HIGH (ref 70–99)
Potassium: 3.6 mEq/L (ref 3.5–5.1)
Sodium: 139 mEq/L (ref 135–145)
Total Bilirubin: 0.3 mg/dL (ref 0.2–1.2)
Total Protein: 7.4 g/dL (ref 6.0–8.3)

## 2020-07-03 LAB — CBC WITH DIFFERENTIAL/PLATELET
Basophils Absolute: 0 10*3/uL (ref 0.0–0.1)
Basophils Relative: 0.5 % (ref 0.0–3.0)
Eosinophils Absolute: 0.1 10*3/uL (ref 0.0–0.7)
Eosinophils Relative: 1.8 % (ref 0.0–5.0)
HCT: 39.2 % (ref 36.0–46.0)
Hemoglobin: 13.3 g/dL (ref 12.0–15.0)
Lymphocytes Relative: 28.6 % (ref 12.0–46.0)
Lymphs Abs: 1.6 10*3/uL (ref 0.7–4.0)
MCHC: 33.9 g/dL (ref 30.0–36.0)
MCV: 90.6 fl (ref 78.0–100.0)
Monocytes Absolute: 0.6 10*3/uL (ref 0.1–1.0)
Monocytes Relative: 10.1 % (ref 3.0–12.0)
Neutro Abs: 3.3 10*3/uL (ref 1.4–7.7)
Neutrophils Relative %: 59 % (ref 43.0–77.0)
Platelets: 296 10*3/uL (ref 150.0–400.0)
RBC: 4.32 Mil/uL (ref 3.87–5.11)
RDW: 14.6 % (ref 11.5–15.5)
WBC: 5.6 10*3/uL (ref 4.0–10.5)

## 2020-07-03 LAB — TSH: TSH: 1.86 u[IU]/mL (ref 0.35–4.50)

## 2020-07-03 LAB — HEMOGLOBIN A1C: Hgb A1c MFr Bld: 5.3 % (ref 4.6–6.5)

## 2020-07-03 NOTE — Assessment & Plan Note (Signed)
Chronic Has tried several medications in the past and none have helped Has tried melatonin, trazodone, Lunesta, Restoril and Ambien CR 6.25 Currently trying to get pregnant so cannot try any medications at this time She is exercising regularly and has good sleep hygiene Denies any depression or anxiety We will check blood work to make sure there are no other causes for insomnia-CBC, CMP, iron levels and TSH Sleep study negative for sleep apnea

## 2020-07-03 NOTE — Assessment & Plan Note (Signed)
History of iron deficiency We will check iron panel, CBC

## 2020-07-03 NOTE — Assessment & Plan Note (Signed)
Chronic Check A1c Continue regular exercise, healthy diet

## 2020-07-03 NOTE — Patient Instructions (Addendum)
  Blood work was ordered.     Flu immunization administered today.     Medications changes include :   none

## 2020-07-03 NOTE — Assessment & Plan Note (Signed)
Chronic Likely related to not sleeping well, but need to rule out other causes Check CBC, CMP and TSH

## 2020-07-04 LAB — IRON,TIBC AND FERRITIN PANEL
%SAT: 10 % (calc) — ABNORMAL LOW (ref 16–45)
Ferritin: 14 ng/mL — ABNORMAL LOW (ref 16–154)
Iron: 37 ug/dL — ABNORMAL LOW (ref 40–190)
TIBC: 389 mcg/dL (calc) (ref 250–450)

## 2020-07-04 NOTE — Addendum Note (Signed)
Addended by: Karma Ganja on: 07/04/2020 01:16 PM   Modules accepted: Orders

## 2020-07-13 ENCOUNTER — Ambulatory Visit: Payer: 59 | Admitting: Psychology

## 2020-07-20 ENCOUNTER — Ambulatory Visit: Payer: 59 | Admitting: Psychology

## 2020-07-27 ENCOUNTER — Ambulatory Visit (INDEPENDENT_AMBULATORY_CARE_PROVIDER_SITE_OTHER): Payer: 59 | Admitting: Psychology

## 2020-07-27 DIAGNOSIS — F332 Major depressive disorder, recurrent severe without psychotic features: Secondary | ICD-10-CM | POA: Diagnosis not present

## 2020-08-03 ENCOUNTER — Ambulatory Visit: Payer: 59 | Admitting: Psychology

## 2020-08-10 ENCOUNTER — Ambulatory Visit: Payer: 59 | Admitting: Psychology

## 2020-08-11 NOTE — L&D Delivery Note (Signed)
Delivery Note At 1:00 AM a viable female was delivered via Vaginal, Spontaneous (Presentation: Left Occiput Anterior). Nuchal cord x 1 reduced mid delivery.  Nuchal arm noted as well.  APGAR: 9, ; weight  .   Placenta status: Spontaneous, Intact.  Cord: 3 vessels with the following complications: None.  Cord pH: arterial gas collected  Uterus manually explored and cleared of clot and debris.   Anesthesia: Epidural Episiotomy:   Lacerations: 1st degree;Periurethral.  RV confirms intact Suture Repair: 2.0 vicryl rapide Est. Blood Loss (mL):  150 cc  Mom to postpartum.  Baby to Couplet care / Skin to Skin.  Lyn Henri 07/07/2021, 1:19 AM

## 2020-08-17 ENCOUNTER — Ambulatory Visit: Payer: 59 | Admitting: Psychology

## 2020-08-24 ENCOUNTER — Ambulatory Visit (INDEPENDENT_AMBULATORY_CARE_PROVIDER_SITE_OTHER): Payer: 59 | Admitting: Psychology

## 2020-08-24 DIAGNOSIS — F332 Major depressive disorder, recurrent severe without psychotic features: Secondary | ICD-10-CM

## 2020-08-30 DIAGNOSIS — R87619 Unspecified abnormal cytological findings in specimens from cervix uteri: Secondary | ICD-10-CM | POA: Insufficient documentation

## 2020-08-31 ENCOUNTER — Ambulatory Visit: Payer: 59 | Admitting: Psychology

## 2020-09-07 ENCOUNTER — Ambulatory Visit (INDEPENDENT_AMBULATORY_CARE_PROVIDER_SITE_OTHER): Payer: 59 | Admitting: Psychology

## 2020-09-07 DIAGNOSIS — F332 Major depressive disorder, recurrent severe without psychotic features: Secondary | ICD-10-CM

## 2020-09-14 ENCOUNTER — Ambulatory Visit: Payer: 59 | Admitting: Psychology

## 2020-09-21 ENCOUNTER — Ambulatory Visit (INDEPENDENT_AMBULATORY_CARE_PROVIDER_SITE_OTHER): Payer: 59 | Admitting: Psychology

## 2020-09-21 DIAGNOSIS — F332 Major depressive disorder, recurrent severe without psychotic features: Secondary | ICD-10-CM

## 2020-09-28 ENCOUNTER — Ambulatory Visit: Payer: 59 | Admitting: Psychology

## 2020-10-02 ENCOUNTER — Other Ambulatory Visit: Payer: Self-pay

## 2020-10-02 ENCOUNTER — Ambulatory Visit (INDEPENDENT_AMBULATORY_CARE_PROVIDER_SITE_OTHER): Payer: Self-pay | Admitting: Plastic Surgery

## 2020-10-02 ENCOUNTER — Encounter: Payer: Self-pay | Admitting: Plastic Surgery

## 2020-10-02 VITALS — BP 122/83 | HR 103

## 2020-10-02 DIAGNOSIS — Z9889 Other specified postprocedural states: Secondary | ICD-10-CM

## 2020-10-02 NOTE — Progress Notes (Signed)
   Subjective:    Patient ID: Robyn Long, female    DOB: 1985-11-18, 35 y.o.   MRN: [id number]  The patient is a 35 year old female here for follow-up after undergoing her breast reduction.  She has a little bit of a dogear on the vertical limb of both breasts.  The right is a little worse than the left.  She would like to see if she can have that revised.  I think that is perfectly reasonable.  I think she should wait on the left side because I think that that will improve with time.  No sign of infection and no other concerning areas.  Her incisions have healed really nicely.     Review of Systems  Constitutional: Negative.   HENT: Negative.   Eyes: Negative.   Respiratory: Negative.   Cardiovascular: Negative.   Gastrointestinal: Negative.   Endocrine: Negative.   Genitourinary: Negative.   Musculoskeletal: Negative.   Psychiatric/Behavioral: Negative.        Objective:   Physical Exam Vitals and nursing note reviewed.  Constitutional:      Appearance: Normal appearance.  HENT:     Head: Normocephalic and atraumatic.  Cardiovascular:     Rate and Rhythm: Normal rate.     Pulses: Normal pulses.  Pulmonary:     Effort: Pulmonary effort is normal.  Chest:    Neurological:     General: No focal deficit present.     Mental Status: She is alert. Mental status is at baseline.  Psychiatric:        Mood and Affect: Mood normal.        Behavior: Behavior normal.        Thought Content: Thought content normal.         Assessment & Plan:     ICD-10-CM   1. S/P bilateral breast reduction  Z98.890      Revision right breast reduction.   Pictures were obtained of the patient and placed in the chart with the patient's or guardian's permission.

## 2020-10-05 ENCOUNTER — Ambulatory Visit: Payer: 59 | Admitting: Psychology

## 2020-10-12 ENCOUNTER — Ambulatory Visit: Payer: 59 | Admitting: Psychology

## 2020-10-19 ENCOUNTER — Ambulatory Visit (INDEPENDENT_AMBULATORY_CARE_PROVIDER_SITE_OTHER): Payer: 59 | Admitting: Psychology

## 2020-10-19 DIAGNOSIS — F332 Major depressive disorder, recurrent severe without psychotic features: Secondary | ICD-10-CM | POA: Diagnosis not present

## 2020-10-26 ENCOUNTER — Ambulatory Visit: Payer: 59 | Admitting: Psychology

## 2020-11-02 ENCOUNTER — Ambulatory Visit (INDEPENDENT_AMBULATORY_CARE_PROVIDER_SITE_OTHER): Payer: 59 | Admitting: Psychology

## 2020-11-02 DIAGNOSIS — F332 Major depressive disorder, recurrent severe without psychotic features: Secondary | ICD-10-CM

## 2020-11-09 ENCOUNTER — Ambulatory Visit: Payer: 59 | Admitting: Psychology

## 2020-11-16 ENCOUNTER — Ambulatory Visit (INDEPENDENT_AMBULATORY_CARE_PROVIDER_SITE_OTHER): Payer: 59 | Admitting: Psychology

## 2020-11-16 DIAGNOSIS — F332 Major depressive disorder, recurrent severe without psychotic features: Secondary | ICD-10-CM

## 2020-11-20 LAB — OB RESULTS CONSOLE RPR: RPR: NONREACTIVE

## 2020-11-20 LAB — OB RESULTS CONSOLE GC/CHLAMYDIA
Chlamydia: NEGATIVE
Gonorrhea: NEGATIVE

## 2020-11-20 LAB — OB RESULTS CONSOLE HIV ANTIBODY (ROUTINE TESTING): HIV: NONREACTIVE

## 2020-11-20 LAB — OB RESULTS CONSOLE RUBELLA ANTIBODY, IGM: Rubella: IMMUNE

## 2020-11-20 LAB — OB RESULTS CONSOLE HEPATITIS B SURFACE ANTIGEN: Hepatitis B Surface Ag: NEGATIVE

## 2020-11-20 LAB — HEPATITIS C ANTIBODY: HCV Ab: NEGATIVE

## 2020-11-23 ENCOUNTER — Ambulatory Visit: Payer: 59 | Admitting: Psychology

## 2020-11-30 ENCOUNTER — Telehealth: Payer: Self-pay | Admitting: Plastic Surgery

## 2020-11-30 ENCOUNTER — Ambulatory Visit: Payer: 59 | Admitting: Psychology

## 2020-11-30 NOTE — Telephone Encounter (Signed)
Patient called to advise that she is pregnant so she will have to postpone the procedure scheduled for 12/11/2020. It was excision of breast tissue. She would like to know how long she should wait after breast feeding before rescheduling this procedure. Also, she paid for the original surgery out of pocket and this procedure was a part of that. How long would she have  Before she would be expected to pay additional fee for the revision? Or would it still be included in the original cost. Please call her to advise. Appt canceled for 12/11/2020.

## 2020-12-03 NOTE — Telephone Encounter (Signed)
Returned patients call. LMVM, she should wait 3-6 months after breast feeding. [place], our Geologist, engineering will contact her in regards to money she has paid for the OP expense.

## 2020-12-07 ENCOUNTER — Ambulatory Visit: Payer: 59 | Admitting: Psychology

## 2020-12-11 ENCOUNTER — Ambulatory Visit: Payer: Self-pay | Admitting: Plastic Surgery

## 2020-12-14 ENCOUNTER — Ambulatory Visit (INDEPENDENT_AMBULATORY_CARE_PROVIDER_SITE_OTHER): Payer: 59 | Admitting: Psychology

## 2020-12-14 DIAGNOSIS — F332 Major depressive disorder, recurrent severe without psychotic features: Secondary | ICD-10-CM | POA: Diagnosis not present

## 2020-12-21 ENCOUNTER — Ambulatory Visit: Payer: 59 | Admitting: Psychology

## 2020-12-28 ENCOUNTER — Ambulatory Visit: Payer: 59 | Admitting: Psychology

## 2021-01-04 ENCOUNTER — Ambulatory Visit (INDEPENDENT_AMBULATORY_CARE_PROVIDER_SITE_OTHER): Payer: 59 | Admitting: Psychology

## 2021-01-04 ENCOUNTER — Ambulatory Visit: Payer: 59 | Admitting: Psychology

## 2021-01-04 DIAGNOSIS — F332 Major depressive disorder, recurrent severe without psychotic features: Secondary | ICD-10-CM

## 2021-01-11 ENCOUNTER — Ambulatory Visit: Payer: 59 | Admitting: Psychology

## 2021-01-25 ENCOUNTER — Ambulatory Visit (INDEPENDENT_AMBULATORY_CARE_PROVIDER_SITE_OTHER): Payer: 59 | Admitting: Psychology

## 2021-01-25 DIAGNOSIS — F332 Major depressive disorder, recurrent severe without psychotic features: Secondary | ICD-10-CM

## 2021-02-22 ENCOUNTER — Ambulatory Visit (INDEPENDENT_AMBULATORY_CARE_PROVIDER_SITE_OTHER): Payer: 59 | Admitting: Psychology

## 2021-02-22 DIAGNOSIS — F332 Major depressive disorder, recurrent severe without psychotic features: Secondary | ICD-10-CM

## 2021-03-08 ENCOUNTER — Ambulatory Visit: Payer: 59 | Admitting: Psychology

## 2021-03-15 ENCOUNTER — Encounter (HOSPITAL_COMMUNITY): Payer: Self-pay

## 2021-03-15 ENCOUNTER — Emergency Department (HOSPITAL_COMMUNITY)
Admission: EM | Admit: 2021-03-15 | Discharge: 2021-03-15 | Disposition: A | Discharge status: Home / Self Care | Payer: Managed Care, Other (non HMO) | Attending: Emergency Medicine | Admitting: Emergency Medicine

## 2021-03-15 ENCOUNTER — Other Ambulatory Visit: Payer: Self-pay

## 2021-03-15 DIAGNOSIS — M25532 Pain in left wrist: Secondary | ICD-10-CM | POA: Insufficient documentation

## 2021-03-15 DIAGNOSIS — O98512 Other viral diseases complicating pregnancy, second trimester: Secondary | ICD-10-CM | POA: Diagnosis present

## 2021-03-15 DIAGNOSIS — Z3A25 25 weeks gestation of pregnancy: Secondary | ICD-10-CM | POA: Diagnosis not present

## 2021-03-15 DIAGNOSIS — U071 COVID-19: Secondary | ICD-10-CM | POA: Insufficient documentation

## 2021-03-15 DIAGNOSIS — M654 Radial styloid tenosynovitis [de Quervain]: Secondary | ICD-10-CM | POA: Insufficient documentation

## 2021-03-15 DIAGNOSIS — R000 Tachycardia, unspecified: Secondary | ICD-10-CM | POA: Insufficient documentation

## 2021-03-15 NOTE — ED Provider Notes (Signed)
[facility] EMERGENCY DEPARTMENT Provider Note   CSN: [id number] Arrival date & time: 03/15/21  1819     History No chief complaint on file.   Robyn Long is a 35 y.o. female with medical history as noted below.  Patient is [redacted] weeks pregnant.  Patient presents emergency department with chief complaint of sore throat, fatigue, cough, fevers.  Patient states that she tested positive for COVID-19 earlier today.  Patient symptoms started 2 days prior.  Patient reports that cough is nonproductive.  Patient states that her cough is exacerbating heartburn associated with her pregnancy causing episodes of vomiting.  Patient states that she has vomited once in the last 24 hours.  Patient denies any hematemesis or coffee-ground emesis.  Patient states fever with T-max of 100.2.  Fever resolved with Tylenol.  Patient endorses sore throat but denies any trouble swallowing, drooling, high potato voice.  Patient denies any chest pain, shortness of breath, leg swelling or tenderness, history of DVT or PE, hormone therapy, surgery in the last 4 weeks, cancer treatment, hemoptysis.  Patient reports that she has received COVID-19 vaccination.    HPI     Past Medical History:  Diagnosis Date   Abnormal Pap smear of cervix 2014   Allergy    Arthritis    Depression    History of chicken pox     Patient Active Problem List   Diagnosis Date Noted   Fatigue 07/03/2020   Hyperglycemia 07/03/2020   History of iron deficiency 07/03/2020   S/P bilateral breast reduction 03/11/2020   Insomnia 09/19/2019   Back pain 06/21/2019   Neck pain 06/21/2019   Os acromiale of right shoulder 02/07/2019   Trigger point of right shoulder region 10/06/2018   Right shoulder pain 06/15/2018   Pain of left calf 12/30/2017   Labral tear of hip joint 12/09/2017   Nonallopathic lesion of thoracic region 07/01/2017   Nonallopathic lesion of sacral region 07/01/2017   Nonallopathic lesion of  lumbosacral region 07/01/2017   Acute bursitis of right shoulder 06/18/2017   Scapular dyskinesis 06/18/2017   Right hip pain 03/23/2017   Constipation 04/08/2016   Cervical intraepithelial neoplasia grade 2 02/10/2013   Knee pain, right anterior 01/06/2013    Past Surgical History:  Procedure Laterality Date   BREAST REDUCTION SURGERY Bilateral 02/2020   CRYOTHERAPY  2014   for abnormal pap smear   HIP SURGERY     right hip     OB History     Gravida  1   Para  0   Term  0   Preterm  0   AB  0   Living  0      SAB  0   IAB  0   Ectopic  0   Multiple  0   Live Births              Family History  Problem Relation Age of Onset   Mitral valve prolapse Father    Hypertension Mother    Colon polyps Mother    Lymphoma Mother    Breast cancer Maternal Aunt 40       deceased from breast cancer   Lung cancer Paternal Grandfather    Mitral valve prolapse Paternal Aunt    Breast cancer Paternal Aunt        over 88   Lung cancer Maternal Grandfather    Mitral valve prolapse Paternal Aunt    Breast cancer Paternal Aunt  over 50    Colon cancer Neg Hx     Social History   Tobacco Use   Smoking status: Never   Smokeless tobacco: Never  Vaping Use   Vaping Use: Never used  Substance Use Topics   Alcohol use: Yes    Alcohol/week: 2.0 standard drinks    Types: 2 Standard drinks or equivalent per week   Drug use: No    Home Medications Prior to Admission medications   Medication Sig Start Date End Date Taking? Authorizing Provider  MAGNESIUM PO Take by mouth.    [provider]  Multiple Vitamins-Minerals (WOMENS MULTIVITAMIN PLUS) TABS Take 1 tablet by mouth daily.    [provider]  VITAMIN D PO Take by mouth.    [provider]    Allergies    Patient has no known allergies.  Review of Systems   Review of Systems  Constitutional:  Positive for chills, fatigue and fever.  HENT:  Positive for sore throat.  Negative for congestion, drooling, rhinorrhea, trouble swallowing and voice change.   Eyes:  Negative for visual disturbance.  Respiratory:  Positive for cough. Negative for shortness of breath.   Cardiovascular:  Negative for chest pain, palpitations and leg swelling.  Gastrointestinal:  Positive for nausea and vomiting. Negative for abdominal pain.  Musculoskeletal:  Negative for back pain and neck pain.  Skin:  Negative for color change and rash.  Neurological:  Negative for dizziness, syncope, light-headedness and headaches.  Psychiatric/Behavioral:  Negative for confusion.    Physical Exam Updated Vital Signs BP 102/72   Pulse (!) 112   Temp 98.2 F (36.8 C)   Resp 18   Ht 5\' 10"  (1.778 m)   Wt 93.9 kg   LMP 09/23/2020 (Exact Date)   SpO2 100%   BMI 29.70 kg/m   Physical Exam Vitals and nursing note reviewed.  Constitutional:      General: She is not in acute distress.    Appearance: She is ill-appearing. She is not toxic-appearing or diaphoretic.  HENT:     Head: Normocephalic.  Eyes:     General: No scleral icterus.       Right eye: No discharge.        Left eye: No discharge.  Cardiovascular:     Rate and Rhythm: Tachycardia present.     Comments: Tachycardic at rate of 112 Pulmonary:     Effort: Pulmonary effort is normal. No tachypnea, bradypnea, prolonged expiration or respiratory distress.     Breath sounds: Normal breath sounds. No stridor.     Comments: Patient speaks in full complete sentences without difficulty Musculoskeletal:     Cervical back: Normal range of motion and neck supple.     Right lower leg: Normal. No swelling or tenderness. No edema.     Left lower leg: Normal. No swelling or tenderness. No edema.  Skin:    General: Skin is warm and dry.  Neurological:     General: No focal deficit present.     Mental Status: She is alert.  Psychiatric:        Behavior: Behavior is cooperative.    ED Results / Procedures / Treatments    Labs (all labs ordered are listed, but only abnormal results are displayed) Labs Reviewed - No data to display  EKG None  Radiology No results found.  Procedures Procedures   Medications Ordered in ED Medications - No data to display  ED Course  I have reviewed the triage  vital signs and the nursing notes.  Pertinent labs & imaging results that were available during my care of the patient were reviewed by me and considered in my medical decision making (see chart for details).    MDM Rules/Calculators/A&P                           Alert 35 year old female in no acute distress, nontoxic-appearing, patient appears ill.  Patient is [redacted] weeks pregnant.  Patient had COVID-19 symptoms x2 days, reports testing positive earlier today.  Patient denies any shortness of breath, lungs clear to auscultation bilaterally, oxygen saturation 100% on room air, able speak in full clear sentences without difficulty.    Patient has no history of DVT or PE, unilateral leg swelling or tenderness, hormone therapy, cancer treatment, hemoptysis, chest pain, shortness of breath.  Patient advised to use Tylenol as needed for management of fever.  Patient to follow-up with primary care provider if symptoms do not improve.  Patient given strict return precautions.  Patient expressed understanding of all instructions and is agreeable to this plan.  Robyn Long was evaluated in Emergency Department on 03/15/2021 for the symptoms described in the history of present illness. She was evaluated in the context of the global COVID-19 pandemic, which necessitated consideration that the patient might be at risk for infection with the SARS-CoV-2 virus that causes COVID-19. Institutional protocols and algorithms that pertain to the evaluation of patients at risk for COVID-19 are in a state of rapid change based on information released by regulatory bodies including the CDC and federal and state organizations. These  policies and algorithms were followed during the patient's care in the ED.   Final Clinical Impression(s) / ED Diagnoses Final diagnoses:  COVID-19    Rx / DC Orders ED Discharge Orders     None        Berneice Heinrich 03/15/21 2344    Charlynne Pander, MD 03/16/21 [phone]

## 2021-03-15 NOTE — ED Triage Notes (Signed)
COVID positive today but having symptoms x 2 days with fever, cough, body aches.  Pt is 25weeks preg but no pregnancy related issues.

## 2021-03-15 NOTE — Discharge Instructions (Signed)
You came to the emergency department today with reports of being positive for Covid-19.   Please isolate at home for at least 7 days after the day your symptoms initially began, and THEN at least 24 hours after you are fever-free without the help of medications.  Please take Tylenol/acetaminophen every 4-6 hours for sore throat, body aches, headache or fever.  Drink plenty of water.  Use saline nasal spray for congestion. Wash your hands frequently. Please rest as needed with frequent repositioning and ambulation as tolerated.    If you use a CPAP or BiPAP device for management of obstructive sleep apnea may continue to use it however use it when isolated from other individuals to avoid spread of COVID-19.   If you use a nebulizer administer medication such as albuterol you may continue to use it however only one isolated from other individuals to avoid the spread of COVID-19.  If your symptoms do not improve please follow-up with your primary care provider or urgent care.  Return to the ER for significant shortness of breath, uncontrollable vomiting, severe chest pain, inability to tolerate fluids, changes in mental status such as confusion or other concerning symptoms.  Get help right away if: You have signs or symptoms of labor before 37 weeks of pregnancy. These include: Contractions that are 5 minutes or less apart, or that increase in frequency, intensity, or length. Sudden, sharp pain in the abdomen or in the lower back. A gush or trickle of fluid from your vagina. You have signs of more serious illness, such as: Trouble breathing. Chest pain. A fever of 102.47F (39C) or higher that does not go away. Vomiting every time you drink fluids. Feeling extremely weak. Fainting.

## 2021-03-22 ENCOUNTER — Ambulatory Visit: Payer: 59 | Admitting: Psychology

## 2021-04-05 ENCOUNTER — Ambulatory Visit (INDEPENDENT_AMBULATORY_CARE_PROVIDER_SITE_OTHER): Payer: 59 | Admitting: Psychology

## 2021-04-05 DIAGNOSIS — F332 Major depressive disorder, recurrent severe without psychotic features: Secondary | ICD-10-CM | POA: Diagnosis not present

## 2021-04-19 ENCOUNTER — Ambulatory Visit: Payer: 59 | Admitting: Psychology

## 2021-05-03 ENCOUNTER — Ambulatory Visit (INDEPENDENT_AMBULATORY_CARE_PROVIDER_SITE_OTHER): Payer: 59 | Admitting: Psychology

## 2021-05-03 DIAGNOSIS — F332 Major depressive disorder, recurrent severe without psychotic features: Secondary | ICD-10-CM | POA: Diagnosis not present

## 2021-05-17 ENCOUNTER — Ambulatory Visit: Payer: 59 | Admitting: Psychology

## 2021-05-31 ENCOUNTER — Ambulatory Visit (INDEPENDENT_AMBULATORY_CARE_PROVIDER_SITE_OTHER): Payer: 59 | Admitting: Psychology

## 2021-05-31 DIAGNOSIS — F332 Major depressive disorder, recurrent severe without psychotic features: Secondary | ICD-10-CM

## 2021-06-06 LAB — OB RESULTS CONSOLE GBS: GBS: NEGATIVE

## 2021-06-14 ENCOUNTER — Ambulatory Visit: Payer: 59 | Admitting: Psychology

## 2021-06-20 ENCOUNTER — Inpatient Hospital Stay (HOSPITAL_COMMUNITY)
Admission: AD | Admit: 2021-06-20 | Payer: Managed Care, Other (non HMO) | Source: Home / Self Care | Admitting: Obstetrics and Gynecology

## 2021-06-28 ENCOUNTER — Ambulatory Visit: Payer: 59 | Admitting: Psychology

## 2021-07-03 ENCOUNTER — Other Ambulatory Visit: Payer: Self-pay | Admitting: Obstetrics and Gynecology

## 2021-07-03 LAB — SARS CORONAVIRUS 2 (TAT 6-24 HRS): SARS Coronavirus 2: NEGATIVE

## 2021-07-04 NOTE — Progress Notes (Signed)
Patient was scheduled for a 0000 IOL. Unable to get patient into unit because of bed status. Dr. Lorane Gell and patient notified.

## 2021-07-05 ENCOUNTER — Inpatient Hospital Stay (HOSPITAL_COMMUNITY)
Admission: AD | Admit: 2021-07-05 | Discharge: 2021-07-08 | DRG: 807 | Disposition: A | Discharge status: Home / Self Care | Payer: Managed Care, Other (non HMO) | Attending: Obstetrics and Gynecology | Admitting: Obstetrics and Gynecology

## 2021-07-05 ENCOUNTER — Encounter (HOSPITAL_COMMUNITY): Payer: Self-pay | Admitting: Obstetrics and Gynecology

## 2021-07-05 ENCOUNTER — Inpatient Hospital Stay (HOSPITAL_COMMUNITY): Payer: Managed Care, Other (non HMO) | Attending: Obstetrics and Gynecology

## 2021-07-05 ENCOUNTER — Other Ambulatory Visit: Payer: Self-pay

## 2021-07-05 DIAGNOSIS — O480 Post-term pregnancy: Principal | ICD-10-CM | POA: Diagnosis present

## 2021-07-05 DIAGNOSIS — Z3490 Encounter for supervision of normal pregnancy, unspecified, unspecified trimester: Secondary | ICD-10-CM

## 2021-07-05 DIAGNOSIS — Z3A41 41 weeks gestation of pregnancy: Secondary | ICD-10-CM

## 2021-07-05 LAB — CBC
HCT: 39.5 % (ref 36.0–46.0)
Hemoglobin: 13.3 g/dL (ref 12.0–15.0)
MCH: 31.6 pg (ref 26.0–34.0)
MCHC: 33.7 g/dL (ref 30.0–36.0)
MCV: 93.8 fL (ref 80.0–100.0)
Platelets: 279 10*3/uL (ref 150–400)
RBC: 4.21 MIL/uL (ref 3.87–5.11)
RDW: 13.2 % (ref 11.5–15.5)
WBC: 9.8 10*3/uL (ref 4.0–10.5)
nRBC: 0 % (ref 0.0–0.2)

## 2021-07-05 LAB — TYPE AND SCREEN
ABO/RH(D): O POS
Antibody Screen: NEGATIVE

## 2021-07-05 MED ORDER — FAMOTIDINE IN NACL 20-0.9 MG/50ML-% IV SOLN
20.0000 mg | Freq: Once | INTRAVENOUS | Status: AC
  Administered 2021-07-05: 20 mg via INTRAVENOUS
  Filled 2021-07-05: qty 50

## 2021-07-05 MED ORDER — LIDOCAINE HCL (PF) 1 % IJ SOLN: 30.0000 mL | INTRAMUSCULAR | Status: DC | PRN

## 2021-07-05 MED ORDER — ONDANSETRON HCL 4 MG/2ML IJ SOLN
4.0000 mg | Freq: Four times a day (QID) | INTRAMUSCULAR | Status: DC | PRN
  Administered 2021-07-05 – 2021-07-06 (×3): 4 mg via INTRAVENOUS
  Filled 2021-07-05 (×3): qty 2

## 2021-07-05 MED ORDER — ACETAMINOPHEN 325 MG PO TABS: 650.0000 mg | ORAL_TABLET | ORAL | Status: DC | PRN

## 2021-07-05 MED ORDER — TERBUTALINE SULFATE 1 MG/ML IJ SOLN
0.2500 mg | Freq: Once | INTRAMUSCULAR | Status: DC | PRN
  Filled 2021-07-05: qty 1

## 2021-07-05 MED ORDER — LACTATED RINGERS IV SOLN: INTRAVENOUS | Status: DC

## 2021-07-05 MED ORDER — LACTATED RINGERS IV SOLN
500.0000 mL | INTRAVENOUS | Status: DC | PRN
Start: 2021-07-05 — End: 2021-07-07
  Administered 2021-07-05: 1000 mL via INTRAVENOUS
  Administered 2021-07-06 (×2): 500 mL via INTRAVENOUS

## 2021-07-05 MED ORDER — BUTORPHANOL TARTRATE 1 MG/ML IJ SOLN: 1.0000 mg | INTRAMUSCULAR | Status: DC | PRN

## 2021-07-05 MED ORDER — MISOPROSTOL 25 MCG QUARTER TABLET
25.0000 ug | ORAL_TABLET | ORAL | Status: DC | PRN
  Administered 2021-07-05: 25 ug via VAGINAL
  Filled 2021-07-05: qty 1

## 2021-07-05 MED ORDER — OXYTOCIN-SODIUM CHLORIDE 30-0.9 UT/500ML-% IV SOLN: 2.5000 [IU]/h | INTRAVENOUS | Status: DC

## 2021-07-05 MED ORDER — HYDROXYZINE HCL 50 MG PO TABS: 50.0000 mg | ORAL_TABLET | Freq: Four times a day (QID) | ORAL | Status: DC | PRN

## 2021-07-05 MED ORDER — SOD CITRATE-CITRIC ACID 500-334 MG/5ML PO SOLN
30.0000 mL | ORAL | Status: DC | PRN
  Administered 2021-07-06: 30 mL via ORAL
  Filled 2021-07-05: qty 30

## 2021-07-05 MED ORDER — OXYCODONE-ACETAMINOPHEN 5-325 MG PO TABS: 1.0000 | ORAL_TABLET | ORAL | Status: DC | PRN

## 2021-07-05 MED ORDER — OXYTOCIN BOLUS FROM INFUSION
333.0000 mL | Freq: Once | INTRAVENOUS | Status: AC
  Administered 2021-07-07: 01:00:00 333 mL via INTRAVENOUS

## 2021-07-05 MED ORDER — OXYCODONE-ACETAMINOPHEN 5-325 MG PO TABS: 2.0000 | ORAL_TABLET | ORAL | Status: DC | PRN

## 2021-07-05 NOTE — Progress Notes (Signed)
Cervix 0.5 cm, thick.  Discussed avoiding additional cytotec due to previously seen decelerations.  Recommended foley balloon placement and with attempted placement, SROM occurred for clear fluid.  Will monitor FHT and begin pitocin titration if reassuring.  Discussed plan, all questions answered.  Robyn Long

## 2021-07-05 NOTE — Progress Notes (Signed)
At bedside for change in fetal heart tracing. Patient is s/p 25 mcg misoprostol at 1654.  Intermittent and then, briefly, recurrent late decelerations noted.  Late decels resolved with IV fluid bolus and position changed. No decels noted in last 45 minutes, variability is currently moderate and acceleration noted at 20:50.  Discussed findings and interpretation with patient.  Will be due for cytotec redose soon, but will hold off given recent FHT changes.  Will reassess cervix and consider foley balloon if cervix is no longer tightly closed.  All questions answered Nilda Simmer MD

## 2021-07-05 NOTE — H&P (Signed)
OB History and Physical   Robyn Long is a 35 y.o. female G1P0 presenting for postdates IOL at [redacted]w[redacted]d.  Reports good FM, denies ctx, VB, LOF.   OB History     Gravida  1   Para  0   Term  0   Preterm  0   AB  0   Living  0      SAB  0   IAB  0   Ectopic  0   Multiple  0   Live Births             Past Medical History:  Diagnosis Date   Abnormal Pap smear of cervix 2014   Allergy    Arthritis    Depression    History of chicken pox    Past Surgical History:  Procedure Laterality Date   BREAST REDUCTION SURGERY Bilateral 02/2020   CRYOTHERAPY  2014   for abnormal pap smear   HIP SURGERY     right hip   Family History: family history includes Breast cancer in her paternal aunt and paternal aunt; Breast cancer (age of onset: 52) in her maternal aunt; Colon polyps in her mother; Hypertension in her mother; Lung cancer in her maternal grandfather and paternal grandfather; Lymphoma in her mother; Mitral valve prolapse in her father, paternal aunt, and paternal aunt. Social History:  reports that she has never smoked. She has never used smokeless tobacco. She reports that she does not currently use alcohol after a past usage of about 2.0 standard drinks per week. She reports that she does not use drugs.     Maternal Diabetes: No Genetic Screening: Normal Maternal Ultrasounds/Referrals: Normal Fetal Ultrasounds or other Referrals:  None Maternal Substance Abuse:  No Significant Maternal Medications:  None Significant Maternal Lab Results:  Group B Strep negative Other Comments:  None  Review of Systems denies fever, chills. SOB, CP. History   Blood pressure 130/83, pulse (!) 105, temperature 98.4 F (36.9 C), temperature source Oral, resp. rate 18, height 5\' 10"  (1.778 m), weight 100.7 kg, last menstrual period 09/23/2020. Exam Physical Exam  Gen: alert, well appearing, no distress Chest: nonlabored breathing CV: no peripheral edema Abdomen:  soft, nontender Ext: no evidence of DVT   Prenatal labs: ABO, Rh: --/--/O POS (11/25 1610) Antibody: NEG (11/25 1610) Rubella:   RPR:    HBsAg:    HIV:    GBS:     Assessment/Plan: Admit to Labor and Delivery Cytotec for cervical ripening, followed by pitocin, AROM Epidural when desired Anticipate vaginal delivery.   09-06-2000 07/05/2021, 5:10 PM

## 2021-07-06 ENCOUNTER — Inpatient Hospital Stay (HOSPITAL_COMMUNITY): Payer: Managed Care, Other (non HMO) | Admitting: Anesthesiology

## 2021-07-06 LAB — RPR: RPR Ser Ql: NONREACTIVE

## 2021-07-06 MED ORDER — PHENYLEPHRINE 40 MCG/ML (10ML) SYRINGE FOR IV PUSH (FOR BLOOD PRESSURE SUPPORT): 80.0000 ug | PREFILLED_SYRINGE | INTRAVENOUS | Status: DC | PRN

## 2021-07-06 MED ORDER — EPHEDRINE 5 MG/ML INJ: 10.0000 mg | INTRAVENOUS | Status: DC | PRN

## 2021-07-06 MED ORDER — FAMOTIDINE IN NACL 20-0.9 MG/50ML-% IV SOLN
20.0000 mg | Freq: Once | INTRAVENOUS | Status: AC
  Administered 2021-07-06: 20 mg via INTRAVENOUS
  Filled 2021-07-06: qty 50

## 2021-07-06 MED ORDER — FENTANYL CITRATE (PF) 100 MCG/2ML IJ SOLN
50.0000 ug | INTRAMUSCULAR | Status: DC | PRN
Start: 2021-07-06 — End: 2021-07-07
  Administered 2021-07-06 (×2): 100 ug via INTRAVENOUS
  Filled 2021-07-06 (×2): qty 2

## 2021-07-06 MED ORDER — FENTANYL-BUPIVACAINE-NACL 0.5-0.125-0.9 MG/250ML-% EP SOLN
12.0000 mL/h | EPIDURAL | Status: DC | PRN
  Administered 2021-07-06: 12 mL/h via EPIDURAL

## 2021-07-06 MED ORDER — TERBUTALINE SULFATE 1 MG/ML IJ SOLN: 0.2500 mg | Freq: Once | INTRAMUSCULAR | Status: DC | PRN

## 2021-07-06 MED ORDER — OXYTOCIN-SODIUM CHLORIDE 30-0.9 UT/500ML-% IV SOLN
1.0000 m[IU]/min | INTRAVENOUS | Status: DC
  Administered 2021-07-06: 2 m[IU]/min via INTRAVENOUS
  Filled 2021-07-06 (×2): qty 500

## 2021-07-06 MED ORDER — DIPHENHYDRAMINE HCL 50 MG/ML IJ SOLN: 12.5000 mg | INTRAMUSCULAR | Status: DC | PRN

## 2021-07-06 MED ORDER — LIDOCAINE HCL (PF) 1 % IJ SOLN
INTRAMUSCULAR | Status: DC | PRN
  Administered 2021-07-06 (×2): 4 mL via EPIDURAL

## 2021-07-06 MED ORDER — FENTANYL-BUPIVACAINE-NACL 0.5-0.125-0.9 MG/250ML-% EP SOLN
EPIDURAL | Status: AC
  Filled 2021-07-06: qty 250

## 2021-07-06 MED ORDER — PHENYLEPHRINE 40 MCG/ML (10ML) SYRINGE FOR IV PUSH (FOR BLOOD PRESSURE SUPPORT)
80.0000 ug | PREFILLED_SYRINGE | INTRAVENOUS | Status: DC | PRN
  Filled 2021-07-06: qty 10

## 2021-07-06 MED ORDER — LACTATED RINGERS IV SOLN
500.0000 mL | Freq: Once | INTRAVENOUS | Status: AC
  Administered 2021-07-06: 500 mL via INTRAVENOUS

## 2021-07-06 NOTE — Anesthesia Procedure Notes (Signed)
Epidural Patient location during procedure: OB Start time: 07/06/2021 5:02 PM End time: 07/06/2021 5:05 PM  Staffing Anesthesiologist: Kaylyn Layer, MD Performed: anesthesiologist   Preanesthetic Checklist Completed: patient identified, IV checked, risks and benefits discussed, monitors and equipment checked, pre-op evaluation and timeout performed  Epidural Patient position: sitting Prep: DuraPrep and site prepped and draped Patient monitoring: continuous pulse ox, blood pressure and heart rate Approach: midline Location: L3-L4 Injection technique: LOR air  Needle:  Needle type: Tuohy  Needle gauge: 17 G Needle length: 9 cm Needle insertion depth: 5 cm Catheter type: closed end flexible Catheter size: 19 Gauge Catheter at skin depth: 10 cm Test dose: negative and Other (1% lidocaine)  Assessment Events: blood not aspirated, injection not painful, no injection resistance, no paresthesia and negative IV test  Additional Notes Patient identified. Risks, benefits, and alternatives discussed with patient including but not limited to bleeding, infection, nerve damage, paralysis, failed block, incomplete pain control, headache, blood pressure changes, nausea, vomiting, reactions to medication, itching, and postpartum back pain. Confirmed with bedside nurse the patient's most recent platelet count. Confirmed with patient that they are not currently taking any anticoagulation, have any bleeding history, or any family history of bleeding disorders. Patient expressed understanding and wished to proceed. All questions were answered. Sterile technique was used throughout the entire procedure. Please see nursing notes for vital signs.   Crisp LOR on first pass. Test dose was given through epidural catheter and negative prior to continuing to dose epidural or start infusion. Warning signs of high block given to the patient including shortness of breath, tingling/numbness in hands, complete  motor block, or any concerning symptoms with instructions to call for help. Patient was given instructions on fall risk and not to get out of bed. All questions and concerns addressed with instructions to call with any issues or inadequate analgesia.  Reason for block:procedure for pain

## 2021-07-06 NOTE — Progress Notes (Signed)
Labor Progress Note  Throughout the day, FHT has been category 1.  Pitocin has been titrated to 12 mU/min. Pt contracting well, quite uncomfortable. She has nausea and emesis with worsening contractions and coughing. She has had 2x doses IV fentanyl with some relief and is interested in epidural.  Cervix has been with small amount of change.  Discussed long process of cervical ripening.  FHT cat 1, continue IOL.   Nilda Simmer MD

## 2021-07-06 NOTE — Anesthesia Preprocedure Evaluation (Addendum)
Anesthesia Evaluation  Patient identified by MRN, date of birth, ID band Patient awake    Reviewed: Allergy & Precautions, Patient's Chart, lab work & pertinent test results  History of Anesthesia Complications Negative for: history of anesthetic complications  Airway Mallampati: II  TM Distance: >3 FB Neck ROM: Full    Dental no notable dental hx.    Pulmonary neg pulmonary ROS,    Pulmonary exam normal        Cardiovascular negative cardio ROS Normal cardiovascular exam     Neuro/Psych Depression    GI/Hepatic negative GI ROS, Neg liver ROS,   Endo/Other  negative endocrine ROS  Renal/GU negative Renal ROS  negative genitourinary   Musculoskeletal  (+) Arthritis ,   Abdominal   Peds  Hematology negative hematology ROS (+)   Anesthesia Other Findings Day of surgery medications reviewed with patient.  Reproductive/Obstetrics (+) Pregnancy                            Anesthesia Physical Anesthesia Plan  ASA: 2  Anesthesia Plan: Epidural   Post-op Pain Management:    Induction:   PONV Risk Score and Plan: Treatment may vary due to age or medical condition  Airway Management Planned: Natural Airway  Additional Equipment: Fetal Monitoring  Intra-op Plan:   Post-operative Plan:   Informed Consent: I have reviewed the patients History and Physical, chart, labs and discussed the procedure including the risks, benefits and alternatives for the proposed anesthesia with the patient or authorized representative who has indicated his/her understanding and acceptance.       Plan Discussed with:   Anesthesia Plan Comments:         Anesthesia Quick Evaluation

## 2021-07-06 NOTE — Progress Notes (Signed)
Labor Progress Note  Cervix checked, complete at 0 station.  FHT moderate variability, pos accels, early decels.   Will allow brief labor down, reassess and begin pushing.  Nilda Simmer

## 2021-07-07 ENCOUNTER — Encounter (HOSPITAL_COMMUNITY): Payer: Self-pay | Admitting: Obstetrics and Gynecology

## 2021-07-07 LAB — CBC
HCT: 34.5 % — ABNORMAL LOW (ref 36.0–46.0)
Hemoglobin: 11.4 g/dL — ABNORMAL LOW (ref 12.0–15.0)
MCH: 31.4 pg (ref 26.0–34.0)
MCHC: 33 g/dL (ref 30.0–36.0)
MCV: 95 fL (ref 80.0–100.0)
Platelets: 228 10*3/uL (ref 150–400)
RBC: 3.63 MIL/uL — ABNORMAL LOW (ref 3.87–5.11)
RDW: 13.2 % (ref 11.5–15.5)
WBC: 15.7 10*3/uL — ABNORMAL HIGH (ref 4.0–10.5)
nRBC: 0 % (ref 0.0–0.2)

## 2021-07-07 MED ORDER — COCONUT OIL OIL: 1.0000 "application " | TOPICAL_OIL | Status: DC | PRN

## 2021-07-07 MED ORDER — DIPHENHYDRAMINE HCL 25 MG PO CAPS: 25.0000 mg | ORAL_CAPSULE | Freq: Four times a day (QID) | ORAL | Status: DC | PRN

## 2021-07-07 MED ORDER — WITCH HAZEL-GLYCERIN EX PADS: 1.0000 "application " | MEDICATED_PAD | CUTANEOUS | Status: DC | PRN

## 2021-07-07 MED ORDER — SIMETHICONE 80 MG PO CHEW: 80.0000 mg | CHEWABLE_TABLET | ORAL | Status: DC | PRN

## 2021-07-07 MED ORDER — TETANUS-DIPHTH-ACELL PERTUSSIS 5-2.5-18.5 LF-MCG/0.5 IM SUSY: 0.5000 mL | PREFILLED_SYRINGE | Freq: Once | INTRAMUSCULAR | Status: DC

## 2021-07-07 MED ORDER — IBUPROFEN 600 MG PO TABS
600.0000 mg | ORAL_TABLET | Freq: Four times a day (QID) | ORAL | Status: DC
  Administered 2021-07-07 – 2021-07-08 (×7): 600 mg via ORAL
  Filled 2021-07-07 (×7): qty 1

## 2021-07-07 MED ORDER — DIBUCAINE (PERIANAL) 1 % EX OINT: 1.0000 "application " | TOPICAL_OINTMENT | CUTANEOUS | Status: DC | PRN

## 2021-07-07 MED ORDER — ACETAMINOPHEN 325 MG PO TABS
650.0000 mg | ORAL_TABLET | ORAL | Status: DC | PRN
  Administered 2021-07-07: 21:00:00 650 mg via ORAL
  Filled 2021-07-07: qty 2

## 2021-07-07 MED ORDER — ONDANSETRON HCL 4 MG PO TABS: 4.0000 mg | ORAL_TABLET | ORAL | Status: DC | PRN

## 2021-07-07 MED ORDER — PRENATAL MULTIVITAMIN CH
1.0000 | ORAL_TABLET | Freq: Every day | ORAL | Status: DC
  Administered 2021-07-07 – 2021-07-08 (×2): 1 via ORAL
  Filled 2021-07-07 (×2): qty 1

## 2021-07-07 MED ORDER — ZOLPIDEM TARTRATE 5 MG PO TABS: 5.0000 mg | ORAL_TABLET | Freq: Every evening | ORAL | Status: DC | PRN

## 2021-07-07 MED ORDER — BENZOCAINE-MENTHOL 20-0.5 % EX AERO
1.0000 "application " | INHALATION_SPRAY | CUTANEOUS | Status: DC | PRN
  Administered 2021-07-07: 1 via TOPICAL
  Filled 2021-07-07: qty 56

## 2021-07-07 MED ORDER — SENNOSIDES-DOCUSATE SODIUM 8.6-50 MG PO TABS
2.0000 | ORAL_TABLET | ORAL | Status: DC
  Administered 2021-07-07 – 2021-07-08 (×2): 2 via ORAL
  Filled 2021-07-07 (×2): qty 2

## 2021-07-07 MED ORDER — ONDANSETRON HCL 4 MG/2ML IJ SOLN: 4.0000 mg | INTRAMUSCULAR | Status: DC | PRN

## 2021-07-07 NOTE — Anesthesia Postprocedure Evaluation (Signed)
Anesthesia Post Note  Patient: Robyn Long  Procedure(s) Performed: AN AD HOC LABOR EPIDURAL     Patient location during evaluation: Mother Baby Anesthesia Type: Epidural Level of consciousness: awake and alert and oriented Pain management: satisfactory to patient Vital Signs Assessment: post-procedure vital signs reviewed and stable Respiratory status: respiratory function stable Cardiovascular status: stable Postop Assessment: no headache, no backache, epidural receding, patient able to bend at knees, no signs of nausea or vomiting, adequate PO intake and able to ambulate Anesthetic complications: no   No notable events documented.  Last Vitals:  Vitals:   07/07/21 0415 07/07/21 0810  BP: 112/72 104/61  Pulse: 98 72  Resp: 18 19  Temp: 36.7 C 37.2 C  SpO2: 98% 98%    Last Pain:  Vitals:   07/07/21 0810  TempSrc: Oral  PainSc: 0-No pain   Pain Goal: Patients Stated Pain Goal: 0 (07/06/21 1645)                 Arion Shankles

## 2021-07-07 NOTE — Progress Notes (Signed)
Postpartum Progress Note  Post Partum Day 0 s/p spontaneous vaginal delivery.  Patient reports well-controlled pain, ambulating without difficulty, voiding spontaneously, tolerating PO.  Vaginal bleeding is appropriate.   Objective: Blood pressure 112/72, pulse 98, temperature 98.1 F (36.7 C), temperature source Oral, resp. rate 18, height 5\' 10"  (1.778 m), weight 100.7 kg, last menstrual period 09/23/2020, SpO2 98 %, unknown if currently breastfeeding.  Physical Exam:  General: alert and no distress Lochia: appropriate Uterine Fundus: firm DVT Evaluation: No evidence of DVT seen on physical exam.  Recent Labs    07/05/21 1620 07/07/21 0436  HGB 13.3 11.4*  HCT 39.5 34.5*    Assessment/Plan: Postpartum Day 0, s/p vaginal delivery. Continue routine postpartum care Lactation following Baby boy 6 hrs old - desires circ, perform prior to discharge when cleared Anticipate discharge home PPD1   LOS: 2 days   07/09/21 07/07/2021, 7:21 AM

## 2021-07-07 NOTE — Lactation Note (Signed)
This note was copied from a baby's chart. Lactation Consultation Note  Patient Name: Robyn Long [id number] Date: 07/07/2021 Reason for consult: Initial assessment;Breast reduction;Term;Primapara;1st time breastfeeding Age:36 hours  Initial visit to 11 hours old infant of a P1 mother. Mother states she had a breast reduction (02/2020) nipple was not involved. Mother reports breast changes with pregnancy. Observed short shafted nipples that evert properly with stimulation. LC demonstrated hand expression, unable to see colostrum. Taught hand pump use with 21-mm, able to see colostrum drop. LC assisted with alignment, support pillows, and latch. Noted active suckling and audible swallows, mother verbalizes comfort with latch. Infant is still breastfeeding upon LC leaving room. Reinforced pumping with DEBP (proactively set up by RN) for better stimulation.    Reviewed normal newborn behavior during first 24h, expected output, tummy size and feeding frequency.    Feeding plan:  1-Breastfeeding on demand or 8-12 times in 24h period  2-Use hand pump for nipple eversion and DEBP after feedings for stimulation 3-Encouraged maternal rest, hydration and food intake.    All questions answered at this time. Provided Lactation services brochure and promoted [employer] booklet information.     Maternal Data Has patient been taught Hand Expression?: Yes Does the patient have breastfeeding experience prior to this delivery?: No  Feeding Mother's Current Feeding Choice: Breast Milk  LATCH Score Latch: Grasps breast easily, tongue down, lips flanged, rhythmical sucking.  Audible Swallowing: Spontaneous and intermittent  Type of Nipple: Everted at rest and after stimulation (short shafted)  Comfort (Breast/Nipple): Soft / non-tender  Hold (Positioning): Assistance needed to correctly position infant at breast and maintain latch.  LATCH Score: 9  Lactation Tools Discussed/Used Tools:  Pump;Flanges Flange Size: 21 Breast pump type: Double-Electric Breast Pump;Manual Pump Education: Setup, frequency, and cleaning;Milk Storage Reason for Pumping: nipple eversion and stimulation (br reduction) Pumping frequency: hand pump: prior to latch for eversion; DEBP: after feedings for stimulation Pumped volume:  (able to see a drop with hand pump demonstration)  Interventions Interventions: Breast feeding basics reviewed;Assisted with latch;Skin to skin;Breast massage;Hand express;Pre-pump if needed;Breast compression;Adjust position;DEBP;Hand pump;Expressed milk;Position options;Support pillows;Education;LC Services brochure  Discharge Pump: Personal;Manual;DEBP WIC Program: No  Consult Status Consult Status: Follow-up Date: 07/08/21 Follow-up type: In-patient    Tieshia Rettinger A Higuera Ancidey 07/07/2021, 12:15 PM

## 2021-07-07 NOTE — Lactation Note (Signed)
This note was copied from a baby's chart. Lactation Consultation Note  Patient Name: Robyn Long [id number] Date: 07/07/2021   Age:36 hours  LC visit attempted, but Mom sleeping. Hx breast reduction surgery noted in maternal chart.  Lurline Hare [facility] 07/07/2021, 8:01 AM

## 2021-07-08 MED ORDER — IBUPROFEN 600 MG PO TABS
600.0000 mg | ORAL_TABLET | Freq: Four times a day (QID) | ORAL | 0 refills | Status: DC | PRN
Start: 2021-07-08 — End: 2022-05-01

## 2021-07-08 MED ORDER — ACETAMINOPHEN 325 MG PO TABS: 650.0000 mg | ORAL_TABLET | Freq: Four times a day (QID) | ORAL | 0 refills | Status: DC | PRN

## 2021-07-08 NOTE — Lactation Note (Signed)
This note was copied from a baby's chart. Lactation Consultation Note  Patient Name: Robyn Long [id number] Date: 07/08/2021 Reason for consult: Follow-up assessment;1st time breastfeeding;Primapara;Breast reduction;Term Age:36 hours  LC in to visit with P1 Mom and FOB of term infant on day of discharge.  Mom has a history of breast reduction surgery in 2021.  Mom states her nipples were not removed, but she does have peri-areolar incisions.  Mom has been exclusively latching and feeding baby and she has noticed that baby has become increasing fussy at the breast.  Mom has been pumping after breastfeeding consistently and hasn't expressed even drops of colostrum.  FOB sitting in chair poised to feed baby formula by bottle.  LC offered assist.   Mom adept with cross cradle hold, only needing slight adjustment in alignment of baby's body.  Talked to parents about the option to supplement baby with formula at the breast as baby latches very well.  5 fr feeding tube with formula instilled into the corner of baby's mouth, showing FOB how to do this.  Baby fed beautifully and sucking the supplement independently.  Mom started to cry she was so happy.  Baby took 20 ml well.  Baby came off the breast contented and nipple rounded.  Mom has a Spectra at home.  Mom will pump after breastfeeding unless she is very tired.  Mom desires OP lactation F/U. Engorgement prevention and treatment reviewed.  Mom aware of OP lactation support available and encouraged to call prn.  LATCH Score Latch: Grasps breast easily, tongue down, lips flanged, rhythmical sucking.  Audible Swallowing: Spontaneous and intermittent (with supplement at the breast)  Type of Nipple: Everted at rest and after stimulation  Comfort (Breast/Nipple): Soft / non-tender  Hold (Positioning): Assistance needed to correctly position infant at breast and maintain latch.  LATCH Score: 9   Lactation Tools Discussed/Used Tools:  Pump;Flanges;74F feeding tube / Syringe Flange Size: 24 Breast pump type: Double-Electric Breast Pump Pump Education: Setup, frequency, and cleaning;Milk Storage Reason for Pumping: Support milk supply/history of breast reduction surgery 2021 Pumping frequency: Q 3-6 hrs Pumped volume: 0 mL  Interventions Interventions: Breast feeding basics reviewed;Assisted with latch;Skin to skin;Breast massage;Hand express;Breast compression;Adjust position;Support pillows;Position options;DEBP  Discharge Discharge Education: Engorgement and breast care;Outpatient recommendation;Outpatient Epic message sent Pump: Personal (Spectra 2)  Consult Status Consult Status: Complete Date: 07/08/21 Follow-up type: Out-patient    Judee Clara 07/08/2021, 3:41 PM

## 2021-07-08 NOTE — Progress Notes (Signed)
Post Partum Day 1 Subjective: no complaints, up ad lib, voiding, tolerating PO, and + flatus  Objective: Blood pressure 118/73, pulse 79, temperature 98.4 F (36.9 C), temperature source Oral, resp. rate 18, height 5\' 10"  (1.778 m), weight 100.7 kg, last menstrual period 09/23/2020, SpO2 98 %, unknown if currently breastfeeding.  Physical Exam:  General: alert, cooperative, and no distress Lochia: appropriate Uterine Fundus: firm Incision: healing well DVT Evaluation: No evidence of DVT seen on physical exam.  Recent Labs    07/05/21 1620 07/07/21 0436  HGB 13.3 11.4*  HCT 39.5 34.5*    Assessment/Plan: Plan for discharge tomorrow D/W discharge instructions in case she wants to go home later today D/W circumcision of newborn boy. Procedure and risks reviewed. She states she understands and agrees.  LOS: 3 days   07/09/21 II 07/08/2021, 7:05 AM

## 2021-07-08 NOTE — Discharge Summary (Signed)
Postpartum Discharge Summary  Date of Service updated11/28/22     Patient Name: Robyn Long DOB: 01-Mar-1986 MRN: [id number]  Date of admission: 07/05/2021 Delivery date:07/07/2021  Delivering provider: Eyvonne Mechanic A  Date of discharge: 07/08/2021  Admitting diagnosis: Pregnancy [Z34.90] Intrauterine pregnancy: [redacted]w[redacted]d    Secondary diagnosis:  Principal Problem:   Pregnancy  Additional problems:     Discharge diagnosis: Term Pregnancy Delivered                                              Post partum procedures: Augmentation: AROM, Pitocin, and Cytotec Complications: None  Hospital course: Induction of Labor With Vaginal Delivery   35y.o. yo G1P1001 at 456w3das admitted to the hospital 07/05/2021 for induction of labor.  Indication for induction: Postdates.  Patient had an uncomplicated labor course as follows: Membrane Rupture Time/Date: 9:20 PM ,07/05/2021   Delivery Method:Vaginal, Spontaneous  Episiotomy:   Lacerations:  1st degree;Periurethral  Details of delivery can be found in separate delivery note.  Patient had a routine postpartum course. Patient is discharged home 07/08/21.  Newborn Data: Birth date:07/07/2021  Birth time:1:00 AM  Gender:Female  Living status:Living  Apgars:9 ,9  Weight:2930 g   Magnesium Sulfate received: No BMZ received: No Rhophylac:No MMR:No T-DaP:Given prenatally Flu: No Transfusion:No  Physical exam  Vitals:   07/07/21 1601 07/07/21 2042 07/08/21 0601 07/08/21 1439  BP: 106/60 121/79 118/73 123/80  Pulse: 79 84 79 82  Resp: '16 18 18 16  ' Temp: 97.9 F (36.6 C)  98.4 F (36.9 C) 98.1 F (36.7 C)  TempSrc: Oral Oral Oral Oral  SpO2: 98% 98% 98% 100%  Weight:      Height:       General: alert, cooperative, and no distress Lochia: appropriate Uterine Fundus: firm Incision: Healing well with no significant drainage DVT Evaluation: No evidence of DVT seen on physical exam. Labs: Lab Results  Component Value  Date   WBC 15.7 (H) 07/07/2021   HGB 11.4 (L) 07/07/2021   HCT 34.5 (L) 07/07/2021   MCV 95.0 07/07/2021   PLT 228 07/07/2021   CMP Latest Ref Rng & Units 07/03/2020  Glucose 70 - 99 mg/dL 105(H)  BUN 6 - 23 mg/dL 13  Creatinine 0.40 - 1.20 mg/dL 0.78  Sodium 135 - 145 mEq/L 139  Potassium 3.5 - 5.1 mEq/L 3.6  Chloride 96 - 112 mEq/L 103  CO2 19 - 32 mEq/L 28  Calcium 8.4 - 10.5 mg/dL 9.6  Total Protein 6.0 - 8.3 g/dL 7.4  Total Bilirubin 0.2 - 1.2 mg/dL 0.3  Alkaline Phos 39 - 117 U/L 78  AST 0 - 37 U/L 15  ALT 0 - 35 U/L 6   Edinburgh Score: Edinburgh Postnatal Depression Scale Screening Tool 07/08/2021  I have been able to laugh and see the funny side of things. 0  I have looked forward with enjoyment to things. 0  I have blamed myself unnecessarily when things went wrong. 1  I have been anxious or worried for no good reason. 2  I have felt scared or panicky for no good reason. 1  Things have been getting on top of me. 0  I have been so unhappy that I have had difficulty sleeping. 1  I have felt sad or miserable. 0  I have been so unhappy that I have been  crying. 1  The thought of harming myself has occurred to me. 0  Edinburgh Postnatal Depression Scale Total 6      After visit meds:  Allergies as of 07/08/2021   No Known Allergies      Medication List     STOP taking these medications    MAGNESIUM PO   VITAMIN D PO       TAKE these medications    acetaminophen 325 MG tablet Commonly known as: Tylenol Take 2 tablets (650 mg total) by mouth every 6 (six) hours as needed (for pain scale < 4).   ibuprofen 600 MG tablet Commonly known as: ADVIL Take 1 tablet (600 mg total) by mouth every 6 (six) hours as needed.   Womens Multivitamin Plus Tabs Take 1 tablet by mouth daily.         Discharge home in stable condition Infant Feeding: Breast Infant Disposition:home with mother Discharge instruction: per After Visit Summary and Postpartum  booklet. Activity: Advance as tolerated. Pelvic rest for 6 weeks.  Diet: routine diet Anticipated Birth Control: Unsure Postpartum Appointment:6 weeks Additional Postpartum F/U:  Future Appointments: Future Appointments  Date Time Provider [facility]  07/12/2021  8:30 AM Oren Binet, PhD [facility] None  07/26/2021  8:30 AM Oren Binet, PhD [facility] None  08/23/2021  8:30 AM Oren Binet, PhD [facility] None  09/06/2021  8:30 AM Oren Binet, PhD [facility] None   Follow up Visit:      07/08/2021 Allena Katz, MD

## 2021-07-08 NOTE — Social Work (Signed)
Robyn Long was referred for history of depression.  * Referral screened out by Clinical Social Worker because none of the following criteria appear to apply:  ~ History of anxiety/depression during this pregnancy, or of post-partum depression following prior delivery.  ~ Diagnosis of anxiety and/or depression within last 3 years. Per chart review, it is noted that depression onset in college. Robyn Long has had mild depression on and off since then. Robyn Long has better coping skills now.  OR * Robyn Long's symptoms currently being treated with medication and/or therapy.  Please contact the Clinical Social Worker if needs arise, by [facility] request, or if Robyn Long scores greater than 9/yes to question 10 on Edinburgh Postpartum Depression Screen.   Vivi Barrack, MSW, LCSW [facility]  Clinical Social Worker  [phone] 07/08/2021  9:08 AM

## 2021-07-12 ENCOUNTER — Ambulatory Visit: Payer: 59 | Admitting: Psychology

## 2021-07-20 ENCOUNTER — Telehealth (HOSPITAL_COMMUNITY): Payer: Self-pay

## 2021-07-20 NOTE — Telephone Encounter (Signed)
No answer. Left message to return nurse call.  Marcelino Duster [facility] 07/20/2021,1013

## 2021-07-26 ENCOUNTER — Ambulatory Visit (INDEPENDENT_AMBULATORY_CARE_PROVIDER_SITE_OTHER): Payer: 59 | Admitting: Psychology

## 2021-07-26 DIAGNOSIS — F332 Major depressive disorder, recurrent severe without psychotic features: Secondary | ICD-10-CM

## 2021-07-26 NOTE — Progress Notes (Addendum)
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 (Status: maintained) -- No Description Entered  Depressed or irritable mood. (Status: maintained) -- No Description Entered  Feelings of hopelessness, worthlessness, or inappropriate guilt. (Status: maintained) -- No Description Entered  Has been exposed to a traumatic event involving actual or perceived threat of death or serious injury. (Status: maintained) -- No Description Entered  Intentionally avoids thoughts, feelings, or discussions related to the traumatic event. (Status: maintained) -- No Description Entered  Low self-esteem. (Status: maintained) -- No Description Entered  Reports response of intense fear, helplessness, or horror to the traumatic event. (Status: maintained) -- No Description Entered  Symptoms present more than one month. (Status: maintained) -- No Description Entered  Medication Status compliance  Safety none  If Suicidal or Homicidal State Action Taken: unspecified  Current Risk: low Medications Rozerem (Dosage: unknown)  Objectives Related Problem: Develop healthy thinking patterns and beliefs about self, others, and the world that lead to the alleviation and help prevent the relapse of depression. Description: Implement mindfulness techniques for relapse prevention. Target Date: 2021-10-25 Frequency: Daily Modality: individual Progress: 50%  Client Response full compliance  Service Location Location, [address] [place], [place] [address]  Service Code cpt [phone]  Psychiatrist  Normalize/Reframe  Emotion regulation skills  Self-monitoring  Lifestyle change (exercise, nutrition)  Self care activities  Identify/label emotions   Identified an insight  Facilitate problem solving  Distress tolerance skill  Validate/empathize  Identify automatic thoughts  Behavioral activation plan  Comments  F 33.2; Substance Abuse  Meds: no psychotropics. Rozerem (for sleep)  Goal: Manage drinking, self-care, emotional stability, improve self-esteem and resolve early trauma experience. Goal date is 01-2021. Revised date 2-23 She is also seeking to adjust to being pregnant, having suffered prior losses. 2-23 Patient agrees to a [employer] session due to the coronavirus. She is at home and I am in home office.   Marcelino Duster had the baby after a 35 hour labor that was very difficult. She and the baby are doing well. Robyn Long is recovering well. She said that her depression has been pronounced, coupled with fatigue. She did not think she cared about breast feeding, but she is not producing enough milk and has to supplement. She likely give it up and has strong feelings of sadness about it. Tearful as she describes that situation. She has had frequent periods of depression/sadness/tears. Weather has made it difficult. Does try to get out as much as possible. I suggested that she talk with her doctor today about an anti-depressant, given that her follow-up appoint is not for another 3 1/2 weeks. She agrees to make the contact.  Damiah's parents drove in for the birth and they were helpful. Talked about "realistic expectations"  for the coming weeks and the importance of reaching out for support when needed. Offered to increase frequency of sessions as she needs.  Garrel Ridgel, PhD  Time: 8:40-9:30 50 minutes.

## 2021-08-09 ENCOUNTER — Ambulatory Visit: Payer: 59 | Admitting: Psychology

## 2021-08-20 DIAGNOSIS — M25511 Pain in right shoulder: Secondary | ICD-10-CM | POA: Insufficient documentation

## 2021-08-23 ENCOUNTER — Ambulatory Visit (INDEPENDENT_AMBULATORY_CARE_PROVIDER_SITE_OTHER): Payer: 59 | Admitting: Psychology

## 2021-08-23 DIAGNOSIS — F332 Major depressive disorder, recurrent severe without psychotic features: Secondary | ICD-10-CM | POA: Diagnosis not present

## 2021-08-23 NOTE — Progress Notes (Signed)
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 (Status: maintained) -- No Description Entered  Depressed or irritable mood. (Status: maintained) -- No Description Entered  Feelings of hopelessness, worthlessness, or inappropriate guilt. (Status: maintained) -- No Description Entered  Has been exposed to a traumatic event involving actual or perceived threat of death or serious injury. (Status: maintained) -- No Description Entered  Intentionally avoids thoughts, feelings, or discussions related to the traumatic event. (Status: maintained) -- No Description Entered  Low self-esteem. (Status: maintained) -- No Description Entered  Reports response of intense fear, helplessness, or horror to the traumatic event. (Status: maintained) -- No Description Entered  Symptoms present more than one month. (Status: maintained) -- No Description Entered  Medication Status compliance  Safety none  If Suicidal or Homicidal State Action Taken: unspecified  Current Risk: low Medications Rozerem (Dosage: unknown)  Objectives Related Problem: Develop healthy thinking patterns and beliefs about self, others, and the world that lead to the alleviation and help prevent the relapse of depression. Description: Implement mindfulness techniques for relapse prevention. Target Date: 2021-10-25 Frequency: Daily Modality: individual Progress: 50%  Client Response full compliance  Service Location Location, [address] [place], [place] [address]  Service Code cpt [phone]  Psychiatrist  Normalize/Reframe  Emotion regulation skills  Self-monitoring  Lifestyle change (exercise, nutrition)  Self care activities  Identify/label emotions   Identified an insight  Facilitate problem solving  Distress tolerance skill  Validate/empathize  Identify automatic thoughts  Behavioral activation plan  Comments  Dx. Alcohol abuse Disorder, Major Depressive Disorder, recurrent  Meds: Zoloft 25mg , Rozerem (for sleep)  Goal: Manage drinking, self-care, emotional stability, improve self-esteem and resolve early trauma experience. Goal date is 01-2021. Revised date 2-23 She is also seeking to adjust to being a parent and manage depressive moods. Goal date 6-23  Patient agrees to a [employer] session due to the coronavirus. She is at home and I am in home office.   10-27-1986 says that she is "not doing well". She was hoping it was hormonal rather than depression. She did discuss her depression with her OB this week and he prescribed 25mg  of Zoloft. Her sleep has been disturbed even when the baby is sleeping. We talked about importance of self-care. 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 Her family will be in town to visit next week. Discussed expectations/concerns and the best strategy to manage the visit. She expressed some disappointment with her experience, and talked about recognizing she is not unique in having some post-partum depression. Encourage her to continue to explore "moms groups" so she can have more interactions with newer moms and not feel so isolated. She agrees and will seek resources.      10-20-1993, PhD  Time: 8:40-9:30 50 minutes.                   Sunday, PhD

## 2021-09-06 ENCOUNTER — Ambulatory Visit (INDEPENDENT_AMBULATORY_CARE_PROVIDER_SITE_OTHER): Payer: 59 | Admitting: Psychology

## 2021-09-06 DIAGNOSIS — F332 Major depressive disorder, recurrent severe without psychotic features: Secondary | ICD-10-CM

## 2021-09-06 NOTE — Progress Notes (Signed)
Date: 09/06/2021  Treatment Plan Diagnosis 296.32 (Major depressive affective disorder, recurrent episode, moderate) [n/a]  F10.10 (Alcohol use disorder, Mild) [n/a]  F43.10 (Posttraumatic stress disorder) [n/a]  Symptoms Consistently uses alcohol or other mood-altering drugs until high, intoxicated, or passed out. (Status: maintained) -- No Description Entered  Depressed or irritable mood. (Status: maintained) -- No Description Entered  Feelings of hopelessness, worthlessness, or inappropriate guilt. (Status: maintained) -- No Description Entered  Has been exposed to a traumatic event involving actual or perceived threat of death or serious injury. (Status: maintained) -- No Description Entered  Intentionally avoids thoughts, feelings, or discussions related to the traumatic event. (Status: maintained) -- No Description Entered  Low self-esteem. (Status: maintained) -- No Description Entered  Reports response of intense fear, helplessness, or horror to the traumatic event. (Status: maintained) -- No Description Entered  Symptoms present more than one month. (Status: maintained) -- No Description Entered  Medication Status compliance  Safety none  If Suicidal or Homicidal State Action Taken: unspecified  Current Risk: low Medications Rozerem (Dosage: unknown)  Objectives Related Problem: Develop healthy thinking patterns and beliefs about self, others, and the world that lead to the alleviation and help prevent the relapse of depression. Description: Implement mindfulness techniques for relapse prevention. Target Date: 2021-10-25 Frequency: Daily Modality: individual Progress: 50%  Client Response full compliance  Service Location Location, [address] [place], [place] [address]  Service Code cpt [phone]  Psychiatrist  Normalize/Reframe  Emotion regulation skills  Self-monitoring  Lifestyle change (exercise, nutrition)  Self care activities  Identify/label emotions   Identified an insight  Facilitate problem solving  Distress tolerance skill  Validate/empathize  Identify automatic thoughts  Behavioral activation plan  Comments  Dx. Alcohol abuse Disorder, Major Depressive Disorder, recurrent  Meds: Zoloft 25mg , Rozerem (for sleep)  Goal: Manage drinking, self-care, emotional stability, improve self-esteem and resolve early trauma experience. Goal date is 01-2021. Revised date 2-23 She is also seeking to adjust to being a parent and manage depressive moods. Goal date 6-23  Patient agrees to a [employer] session due to the coronavirus. She is at home and I am in home office.   Sharyn Lull says she has now been on the medicine for 2 weeks. Is getting a little more sleep by taking advantage of when son goes down to sleep. She is still having "random crying episodes", but is better at talking with [name] about her feelings. She reports she is getting better about setting and establishing boundaries. Her parents were here for several days and it mostly went well. She feels bad that she got emotional several times while they were around. 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 Her brother and sister in law came to visit as well. It was a nice visit, but her sister in law is "intense". She and [name] are thinking of when to move up [place]. She would like for them to consider moving here, but her father is resistant.            [name], PhD  Time: 8:40-9:30 50 minutes.                                  [name], PhD

## 2021-09-20 ENCOUNTER — Ambulatory Visit: Payer: 59 | Admitting: Psychology

## 2021-10-04 ENCOUNTER — Ambulatory Visit: Payer: 59 | Admitting: Psychology

## 2021-10-18 ENCOUNTER — Ambulatory Visit (INDEPENDENT_AMBULATORY_CARE_PROVIDER_SITE_OTHER): Payer: 59 | Admitting: Psychology

## 2021-10-18 DIAGNOSIS — F332 Major depressive disorder, recurrent severe without psychotic features: Secondary | ICD-10-CM

## 2021-10-18 NOTE — Progress Notes (Signed)
? ? ? ? ? ? ?Date: 10/18/2021  ?Treatment Plan ?Diagnosis ?296.32 (Major depressive affective disorder, recurrent episode, moderate) [n/a]  ?F10.10 (Alcohol use disorder, Mild) [n/a]  ?F43.10 (Posttraumatic stress disorder) [n/a]  ?Symptoms ?Consistently uses alcohol or other mood-altering drugs until high, intoxicated, or passed out. (Status: maintained) -- No Description Entered  ?Depressed or irritable mood. (Status: maintained) -- No Description Entered  ?Feelings of hopelessness, worthlessness, or inappropriate guilt. (Status: maintained) -- No Description Entered  ?Has been exposed to a traumatic event involving actual or perceived threat of death or serious injury. (Status: maintained) -- No Description Entered  ?Intentionally avoids thoughts, feelings, or discussions related to the traumatic event. (Status: maintained) -- No Description Entered  ?Low self-esteem. (Status: maintained) -- No Description Entered  ?Reports response of intense fear, helplessness, or horror to the traumatic event. (Status: maintained) -- No Description Entered  ?Symptoms present more than one month. (Status: maintained) -- No Description Entered  ?Medication Status ?compliance  ?Safety ?none  ?If Suicidal or Homicidal State Action Taken: unspecified  ?Current Risk: low ?Medications ?Rozerem (Dosage: unknown)  ?Objectives ?Related Problem: Develop healthy thinking patterns and beliefs about self, others, and the world that lead to the alleviation and help prevent the relapse of depression. ?Description: Implement mindfulness techniques for relapse prevention. ?Target Date: 2021-10-25 ?Frequency: Daily ?Modality: individual ?Progress: 50% ? ?Client Response ?full compliance  ?Service Location ?Location, [address] [place], [place] [address]  ?Service Code ?cpt [id number]  ?Psychiatrist  ?Normalize/Reframe  ?Emotion regulation skills  ?Self-monitoring  ?Lifestyle change (exercise, nutrition)  ?Self care activities  ?Identify/label  emotions  ?Identified an insight  ?Facilitate problem solving  ?Distress tolerance skill  ?Validate/empathize  ?Identify automatic thoughts  ?Behavioral activation plan  ?Session notes:  ?Dx. Alcohol abuse Disorder, Major Depressive Disorder, recurrent  ?Meds: Zoloft 25mg , Rozerem (for sleep)  ?Goal: Manage drinking, self-care, emotional stability, improve self-esteem and resolve early trauma experience. Goal date is 01-2021. Revised date 2-23 She is also seeking to adjust to being a parent and manage depressive moods. Goal date 6-23  ?Patient agrees to a 10-27-1985 session due to the coronavirus. She is at home and I am in home office.  ? ?[employer] says her son is sleeping through the entire night. She is much relieved. Marcelino Duster did have 1 trip to ER because of some swelling in son's scrotum. It resolved and there was no problem. She states that "we are okay 'ish' and still challenged by the new role as mother". She is grateful [birth year]1995 works from from because he helps during the day. The unpredictability is difficult, as is not having control over her schedule. 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 ?Her meds have helped with the "overwhelming" feelings. Says she is doing better about reaching out to [facility] when she is having a hard time. That helps her manage difficult emotions. She recognizes that in order to take of the baby, she has to take care of herself. She says she needs to figure out how to address her "resentment/bitterness" about losing her "freedom". We discussed how to manage and the need to let people help/support without feeling guilty.  ?    ? ? ?      ? ?[facility], PhD  Time: 8:40-9:30 50 minutes. ?    ? ? ? ? ? ? ? ? ? ? ? ? ? ? ? ? ? ? ? ? ? ? ? ? ? ? ? ? ? ? ?

## 2021-11-01 ENCOUNTER — Ambulatory Visit: Payer: 59 | Admitting: Psychology

## 2021-11-15 ENCOUNTER — Ambulatory Visit (INDEPENDENT_AMBULATORY_CARE_PROVIDER_SITE_OTHER): Payer: 59 | Admitting: Psychology

## 2021-11-15 DIAGNOSIS — F332 Major depressive disorder, recurrent severe without psychotic features: Secondary | ICD-10-CM | POA: Diagnosis not present

## 2021-11-15 NOTE — Progress Notes (Addendum)
? ?  Date: 11/15/2021  ?Treatment Plan ?Diagnosis ?296.32 (Major depressive affective disorder, recurrent episode, moderate) [n/a]  ?F10.10 (Alcohol use disorder, Mild) [n/a]  ?F43.10 (Posttraumatic stress disorder) [n/a]  ?Symptoms ?Consistently uses alcohol or other mood-altering drugs until high, intoxicated, or passed out. (Status: maintained) -- No Description Entered  ?Depressed or irritable mood. (Status: maintained) -- No Description Entered  ?Feelings of hopelessness, worthlessness, or inappropriate guilt. (Status: maintained) -- No Description Entered  ?Has been exposed to a traumatic event involving actual or perceived threat of death or serious injury. (Status: maintained) -- No Description Entered  ?Intentionally avoids thoughts, feelings, or discussions related to the traumatic event. (Status: maintained) -- No Description Entered  ?Low self-esteem. (Status: maintained) -- No Description Entered  ?Reports response of intense fear, helplessness, or horror to the traumatic event. (Status: maintained) -- No Description Entered  ?Symptoms present more than one month. (Status: maintained) -- No Description Entered  ?Medication Status ?compliance  ?Safety ?none  ?If Suicidal or Homicidal State Action Taken: unspecified  ?Current Risk: low ?Medications ?Rozerem (Dosage: unknown)  ?Objectives ?Related Problem: Develop healthy thinking patterns and beliefs about self, others, and the world that lead to the alleviation and help prevent the relapse of depression. ?Description: Implement mindfulness techniques for relapse prevention. ?Target Date: 2022-07-27 ?Frequency: Daily ?Modality: individual ?Progress: 50% ? ?Client Response ?full compliance  ?Service Location ?Location, [address] [place], [place] [address]  ?Service Code ?cpt [id number]  ?Psychiatrist  ?Normalize/Reframe  ?Emotion regulation skills  ?Self-monitoring  ?Lifestyle change (exercise, nutrition)  ?Self care activities  ?Identify/label emotions   ?Identified an insight  ?Facilitate problem solving  ?Distress tolerance skill  ?Validate/empathize  ?Identify automatic thoughts  ?Behavioral activation plan  ?Session notes:  ?Dx. Alcohol abuse Disorder, Major Depressive Disorder, recurrent  ?Meds: Zoloft 25mg , Rozerem (for sleep)  ?Goal: Manage drinking, self-care, emotional stability, improve self-esteem and resolve early trauma experience. Goal date is 01-2021. Revised date 12-23 She is also seeking to adjust to being a parent and manage depressive moods. Goal date 12-23  ?Patient agrees to a [employer] session due to the coronavirus. She is at home and I am in home office.  ? ?Robyn Long says she is engaged with end of the semester activities and it is more challenging with her son. She is trying to determine how to use summer to prepare for the Fall. She says she is still have some difficult times emotionally, but less than before. She is feeling that her meds have helped and plans to continue. 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 Her parents will be driving to visit the 21st of this month. Will be here at least a week. She says that she and Robyn Long are doing well with co-parenting and she is learning to appreciate that they have different styles.      ?    ? ? ?      ? ?Robyn Morel, PhD  Time: 8:40-9:30 50 minutes. ?    ? ? ? ? ? ? ? ? ? ? ? ? ? ? ? ? ? ? ? ? ? ? ? ? ? ? ? ? ? ? ?

## 2021-11-29 ENCOUNTER — Ambulatory Visit: Payer: 59 | Admitting: Psychology

## 2021-12-13 ENCOUNTER — Ambulatory Visit (INDEPENDENT_AMBULATORY_CARE_PROVIDER_SITE_OTHER): Payer: 59 | Admitting: Psychology

## 2021-12-13 DIAGNOSIS — F331 Major depressive disorder, recurrent, moderate: Secondary | ICD-10-CM | POA: Diagnosis not present

## 2021-12-13 NOTE — Progress Notes (Signed)
? ? ? ? ? ? ? ? ? ? ? ? ? ? ? ? ?Date: 12/13/2021  ?Treatment Plan ?Diagnosis ?296.32 (Major depressive affective disorder, recurrent episode, moderate) [n/a]  ?F10.10 (Alcohol use disorder, Mild) [n/a]  ?F43.10 (Posttraumatic stress disorder) [n/a]  ?Symptoms ?Consistently uses alcohol or other mood-altering drugs until high, intoxicated, or passed out. (Status: maintained) -- No Description Entered  ?Depressed or irritable mood. (Status: maintained) -- No Description Entered  ?Feelings of hopelessness, worthlessness, or inappropriate guilt. (Status: maintained) -- No Description Entered  ?Has been exposed to a traumatic event involving actual or perceived threat of death or serious injury. (Status: maintained) -- No Description Entered  ?Intentionally avoids thoughts, feelings, or discussions related to the traumatic event. (Status: maintained) -- No Description Entered  ?Low self-esteem. (Status: maintained) -- No Description Entered  ?Reports response of intense fear, helplessness, or horror to the traumatic event. (Status: maintained) -- No Description Entered  ?Symptoms present more than one month. (Status: maintained) -- No Description Entered  ?Medication Status ?compliance  ?Safety ?none  ?If Suicidal or Homicidal State Action Taken: unspecified  ?Current Risk: low ?Medications ?Rozerem (Dosage: unknown)  ?Objectives ?Related Problem: Develop healthy thinking patterns and beliefs about self, others, and the world that lead to the alleviation and help prevent the relapse of depression. ?Description: Implement mindfulness techniques for relapse prevention. ?Target Date: 2022-07-27 ?Frequency: Daily ?Modality: individual ?Progress: 50% ? ?Client Response ?full compliance  ?Service Location ?Location, [address] [place], [place] [address]  ?Service Code ?cpt [id number]  ?Psychiatrist  ?Normalize/Reframe  ?Emotion regulation skills  ?Self-monitoring  ?Lifestyle change (exercise, nutrition)  ?Self care  activities  ?Identify/label emotions  ?Identified an insight  ?Facilitate problem solving  ?Distress tolerance skill  ?Validate/empathize  ?Identify automatic thoughts  ?Behavioral activation plan  ?Session notes:  ?Dx. Alcohol abuse Disorder, Major Depressive Disorder, recurrent  ?Meds: Zoloft 25mg , Rozerem (for sleep)  ?Goal: Manage drinking, self-care, emotional stability, improve self-esteem and resolve early trauma experience. Goal date is 01-2021. Revised date 12-23 She is also seeking to adjust to being a parent and manage depressive moods. Goal date 12-23  ?Patient agrees to a 05-26-1993 session due to the coronavirus. She is at home and I am in home office.  ? ?[employer] says she has mostly finished the semester and it went fine. She does not have a plan for the summer at this point. Thinking of a trip to [place] at the end of July. She says "right now she is feeling emotionally okay". She anticipates a difficult transition from being very busy to less busy. 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 We discussed ways she can try to offset the stress during the summer by scheduling a number of activities.  ?She says her parents were in town her last week of school. It was helpful, but also stressful. The visit went well. She talked about how her mother's cancer aged both of her parents and that is difficult. She is still contemplating a move to [place], especially since she is seeing her parents aging. She is reluctant and had hoped her parents would move here, but her father tells her that will not happen. She hopes the next visit to [place] will help them make the decision.       ?    ? ? ?      ? ?[place], PhD  Time: 8:40-9:30 50 minutes. ?    ? ? ? ? ? ? ? ? ? ? ? ? ? ? ? ? ? ? ? ? ? ? ? ? ? ? ? ? ? ? ?

## 2021-12-27 ENCOUNTER — Ambulatory Visit: Payer: 59 | Admitting: Psychology

## 2022-01-10 ENCOUNTER — Ambulatory Visit: Payer: 59 | Admitting: Psychology

## 2022-01-24 ENCOUNTER — Ambulatory Visit: Payer: 59 | Admitting: Psychology

## 2022-02-07 ENCOUNTER — Ambulatory Visit: Payer: 59 | Admitting: Psychology

## 2022-02-21 ENCOUNTER — Ambulatory Visit: Payer: 59 | Admitting: Psychology

## 2022-03-07 ENCOUNTER — Ambulatory Visit (INDEPENDENT_AMBULATORY_CARE_PROVIDER_SITE_OTHER): Payer: 59 | Admitting: Psychology

## 2022-03-07 DIAGNOSIS — F331 Major depressive disorder, recurrent, moderate: Secondary | ICD-10-CM | POA: Diagnosis not present

## 2022-03-07 NOTE — Progress Notes (Signed)
Date: 03/07/2022  Treatment Plan Diagnosis 296.32 (Major depressive affective disorder, recurrent episode, moderate) [n/a]  F10.10 (Alcohol use disorder, Mild) [n/a]  F43.10 (Posttraumatic stress disorder) [n/a]  Symptoms Consistently uses alcohol or other mood-altering drugs until high, intoxicated, or passed out. (Status: maintained) -- No Description Entered  Depressed or irritable mood. (Status: maintained) -- No Description Entered  Feelings of hopelessness, worthlessness, or inappropriate guilt. (Status: maintained) -- No Description Entered  Has been exposed to a traumatic event involving actual or perceived threat of death or serious injury. (Status: maintained) -- No Description Entered  Intentionally avoids thoughts, feelings, or discussions related to the traumatic event. (Status: maintained) -- No Description Entered  Low self-esteem. (Status: maintained) -- No Description Entered  Reports response of intense fear, helplessness, or horror to the traumatic event. (Status: maintained) -- No Description Entered  Symptoms present more than one month. (Status: maintained) -- No Description Entered  Medication Status compliance  Safety none  If Suicidal or Homicidal State Action Taken: unspecified  Current Risk: low Medications Rozerem (Dosage: unknown)  Objectives Related Problem: Develop healthy thinking patterns and beliefs about self, others, and the world that lead to the alleviation and help prevent the relapse of depression. Description: Implement mindfulness techniques for relapse prevention. Target Date: 2022-07-27 Frequency: Daily Modality: individual Progress: 50%  Client Response full compliance  Service Location Location, [address] [place], [place] [address]  Service Code cpt [id number]  Psychiatrist  Normalize/Reframe  Emotion regulation skills  Self-monitoring  Lifestyle change (exercise,  nutrition)  Self care activities  Identify/label emotions  Identified an insight  Facilitate problem solving  Distress tolerance skill  Validate/empathize  Identify automatic thoughts  Behavioral activation plan  Session notes:  Dx. Alcohol abuse Disorder, Major Depressive Disorder, recurrent  Meds: Zoloft 25mg , Rozerem (for sleep)  Goal: Manage drinking, self-care, emotional stability, improve self-esteem and resolve early trauma experience. Goal date is 01-2021. Revised date 12-23 She is also seeking to adjust to being a parent and manage depressive moods. Goal date 12-23  Patient agrees to a Webex Video session due to the coronavirus. She is at home and I am in home office.   05-26-1994 says that they have just returned from taking [place] to [place]. It was first opportunity for many of her family to see him. Up until 2 weeks ago, not much has been going on. She says that things have been "easier" than anticipated. She says the "workout mom group" has been a good find. 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 She is a little anxious about how she will handle the separation. She questions about whether [place] should work from home while [place] is at their home.  I inquired about her mindset regarding her role as parent. She admits to occasional feelings of questioning her decision, but that is relatively rare. She reports not feeling as helpless as before and it is less overwhelming. She also reports that she and [employer] have found a good rhythm. They are in a "holding pattern" about moving to [place]. They have not reopened that conversation in a while. Reports that her depression is stable and that the medicine has helped. Plans to continue with the medication. She says that she will occassionally drink alcohol and does not over  indulge. No episodes reported of self-medication. Has made significant improvements.                       Garrel Ridgel, PhD   Time: 8:40-9:30 50 minutes.

## 2022-03-21 ENCOUNTER — Ambulatory Visit: Payer: 59 | Admitting: Psychology

## 2022-04-04 ENCOUNTER — Ambulatory Visit: Payer: 59 | Admitting: Psychology

## 2022-04-18 ENCOUNTER — Ambulatory Visit: Payer: 59 | Admitting: Psychology

## 2022-04-21 ENCOUNTER — Ambulatory Visit: Payer: 59 | Admitting: Psychology

## 2022-04-30 ENCOUNTER — Encounter: Payer: Self-pay | Admitting: Internal Medicine

## 2022-04-30 NOTE — Progress Notes (Unsigned)
    Subjective:    Patient ID: Robyn Long, female    DOB: Sep 15, 1985, 36 y.o.   MRN: [id number]      HPI Robyn Long is here for No chief complaint on file.    Cough that started over a month ago -     Medications and allergies reviewed with patient and updated if appropriate.  Current Outpatient Medications on File Prior to Visit  Medication Sig Dispense Refill   acetaminophen (TYLENOL) 325 MG tablet Take 2 tablets (650 mg total) by mouth every 6 (six) hours as needed (for pain scale < 4). 60 tablet 0   ibuprofen (ADVIL) 600 MG tablet Take 1 tablet (600 mg total) by mouth every 6 (six) hours as needed. 60 tablet 0   Multiple Vitamins-Minerals (WOMENS MULTIVITAMIN PLUS) TABS Take 1 tablet by mouth daily.     No current facility-administered medications on file prior to visit.    Review of Systems     Objective:  There were no vitals filed for this visit. BP Readings from Last 3 Encounters:  07/08/21 123/80  03/15/21 102/65  10/02/20 122/83   Wt Readings from Last 3 Encounters:  07/05/21 222 lb (100.7 kg)  03/15/21 207 lb (93.9 kg)  07/03/20 202 lb 6.4 oz (91.8 kg)   There is no height or weight on file to calculate BMI.    Physical Exam         Assessment & Plan:    See Problem List for Assessment and Plan of chronic medical problems.

## 2022-05-01 ENCOUNTER — Ambulatory Visit (INDEPENDENT_AMBULATORY_CARE_PROVIDER_SITE_OTHER): Payer: Managed Care, Other (non HMO) | Admitting: Internal Medicine

## 2022-05-01 DIAGNOSIS — R059 Cough, unspecified: Secondary | ICD-10-CM | POA: Insufficient documentation

## 2022-05-01 DIAGNOSIS — R052 Subacute cough: Secondary | ICD-10-CM | POA: Diagnosis not present

## 2022-05-01 MED ORDER — PREDNISONE 20 MG PO TABS: 40.0000 mg | ORAL_TABLET | Freq: Every day | ORAL | 0 refills | Status: AC

## 2022-05-01 NOTE — Patient Instructions (Addendum)
      Medications changes include :   prednisone 40 mg daily for 5 days  - take with food   Your prescription(s) have been sent to your pharmacy.      Return if symptoms worsen or fail to improve.

## 2022-05-01 NOTE — Assessment & Plan Note (Addendum)
Subacute  Likely reactive airway disease Lungs clear on exam Discussed options of inhaler vs steroids vs continuing symptomatic treatment Will try Prednisone 40 mg daily x 5 days - take with food Discussed possible side effects Can take otc cough suppressants prn Call if no improvement, questions

## 2022-05-02 ENCOUNTER — Ambulatory Visit: Payer: 59 | Admitting: Psychology

## 2022-05-16 ENCOUNTER — Ambulatory Visit: Payer: 59 | Admitting: Psychology

## 2022-05-30 ENCOUNTER — Ambulatory Visit: Payer: 59 | Admitting: Psychology

## 2022-05-30 ENCOUNTER — Ambulatory Visit (INDEPENDENT_AMBULATORY_CARE_PROVIDER_SITE_OTHER): Payer: 59 | Admitting: Psychology

## 2022-05-30 DIAGNOSIS — F331 Major depressive disorder, recurrent, moderate: Secondary | ICD-10-CM | POA: Diagnosis not present

## 2022-05-30 NOTE — Progress Notes (Signed)
Date: 05/30/2022  Treatment Plan Diagnosis 296.32 (Major depressive affective disorder, recurrent episode, moderate) [n/a]  F10.10 (Alcohol use disorder, Mild) [n/a]  F43.10 (Posttraumatic stress disorder) [n/a]  Symptoms Consistently uses alcohol or other mood-altering drugs until high, intoxicated, or passed out. (Status: maintained) -- No Description Entered  Depressed or irritable mood. (Status: maintained) -- No Description Entered  Feelings of hopelessness, worthlessness, or inappropriate guilt. (Status: maintained) -- No Description Entered  Has been exposed to a traumatic event involving actual or perceived threat of death or serious injury. (Status: maintained) -- No Description Entered  Intentionally avoids thoughts, feelings, or discussions related to the traumatic event. (Status: maintained) -- No Description Entered  Low self-esteem. (Status: maintained) -- No Description Entered  Reports response of intense fear, helplessness, or horror to the traumatic event. (Status: maintained) -- No Description Entered  Symptoms present more than one month. (Status: maintained) -- No Description Entered  Medication Status compliance  Safety none  If Suicidal or Homicidal State Action Taken: unspecified  Current Risk: low Medications Rozerem (Dosage: unknown)  Objectives Related Problem: Develop healthy thinking patterns and beliefs about self, others, and the world that lead to the alleviation and help prevent the relapse of depression. Description: Implement mindfulness techniques for relapse prevention. Target Date: 2022-07-27 Frequency: Daily Modality: individual Progress: 50%  Client Response full compliance  Service Location Location, [address] [place], [place] [address]  Service Code cpt [phone]  Psychiatrist  Normalize/Reframe  Emotion regulation skills  Self-monitoring   Lifestyle change (exercise, nutrition)  Self care activities  Identify/label emotions  Identified an insight  Facilitate problem solving  Distress tolerance skill  Validate/empathize  Identify automatic thoughts  Behavioral activation plan  Session notes:  Dx. Alcohol abuse Disorder, Major Depressive Disorder, recurrent  Meds: Zoloft 25mg , Rozerem (for sleep)  Goal: Manage drinking, self-care, emotional stability, improve self-esteem and resolve early trauma experience. Goal date is 01-2021. Revised date 12-23 She is also seeking to adjust to being a parent and manage depressive moods. Goal date 12-23  Patient agrees to a Webex Video session due to the coronavirus. She is at home and I am in home office.   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 States there are good days and days that are very difficult with regard to her parenting. Son is almost 11 months. We had extensive discussion about whether to continue periodic counseling sessions. We reviewed all of the issues from the past that we have addressed in counseling. She reports that all of these issues are in a manageable pace at this time. Decided to discontinue sessions at this time. Let her know that I can be available if she feels a need to address any issues in the future. She has accomplished the goals she had established for therapy and reports considerable stability.                        [name], PhD  Time: 8:40-9:30 50 minutes.

## 2022-06-13 ENCOUNTER — Ambulatory Visit: Payer: 59 | Admitting: Psychology

## 2022-06-27 ENCOUNTER — Ambulatory Visit: Payer: 59 | Admitting: Psychology

## 2022-07-11 ENCOUNTER — Ambulatory Visit: Payer: 59 | Admitting: Psychology

## 2022-07-25 ENCOUNTER — Ambulatory Visit: Payer: 59 | Admitting: Psychology

## 2022-08-08 ENCOUNTER — Ambulatory Visit: Payer: 59 | Admitting: Psychology

## 2022-08-22 ENCOUNTER — Ambulatory Visit: Payer: 59 | Admitting: Psychology

## 2022-10-04 ENCOUNTER — Telehealth: Payer: Self-pay | Admitting: Obstetrics and Gynecology

## 2022-10-04 ENCOUNTER — Encounter: Payer: Self-pay | Admitting: Obstetrics and Gynecology

## 2022-10-04 NOTE — Telephone Encounter (Signed)
Pt called because yesterday she noticed she had a tampon in her vagina, which had likely been there about 4 days. She removed without difficulty. She had an odor and discharge. She called telehealth through her insurance and was prescribed metronidazole for a week. She is on day 2 of treatment. She reports she is worried that she may need further treatment. She states that she no longer has a discharge/ odor. Denies fever or chills or abdominal pain. We discussed that it would be ok to monitor and she does not need any further treatment at this time. She will notify if she does develop any concerning symptoms.   Jaquita Folds, MD

## 2022-10-07 ENCOUNTER — Encounter: Payer: Self-pay | Admitting: Obstetrics and Gynecology

## 2022-10-07 ENCOUNTER — Other Ambulatory Visit (HOSPITAL_COMMUNITY)
Admission: RE | Admit: 2022-10-07 | Discharge: 2022-10-07 | Disposition: A | Discharge status: Home / Self Care | Payer: Managed Care, Other (non HMO) | Source: Ambulatory Visit | Attending: Obstetrics and Gynecology | Admitting: Obstetrics and Gynecology

## 2022-10-07 ENCOUNTER — Ambulatory Visit (INDEPENDENT_AMBULATORY_CARE_PROVIDER_SITE_OTHER): Payer: Managed Care, Other (non HMO) | Admitting: Obstetrics and Gynecology

## 2022-10-07 VITALS — BP 128/70 | HR 66 | Ht 71.0 in | Wt 213.0 lb

## 2022-10-07 DIAGNOSIS — Z124 Encounter for screening for malignant neoplasm of cervix: Secondary | ICD-10-CM | POA: Insufficient documentation

## 2022-10-07 DIAGNOSIS — N644 Mastodynia: Secondary | ICD-10-CM

## 2022-10-07 DIAGNOSIS — Z01419 Encounter for gynecological examination (general) (routine) without abnormal findings: Secondary | ICD-10-CM | POA: Diagnosis not present

## 2022-10-07 NOTE — Progress Notes (Signed)
37 y.o. G65P1001 Married White or Caucasian Not Hispanic or Latino female here for annual exam.   Period Cycle (Days): 28 Period Duration (Days): 5 Period Pattern: Regular Menstrual Flow: Heavy Menstrual Control: Tampon, Thin pad Menstrual Control Change Freq (Hours): 2 Dysmenorrhea: None She can saturate a super tampon in up to 2 hours for one day of her cycle.  No dyspareunia.   She had a baby boy in 11/22, NSVD.   She c/o a couple of month h/o intermittent right lateral breast pain.   Patient's last menstrual period was 09/23/2022.          Sexually active: Yes.    The current method of family planning is condoms most of the time.    Exercising: Yes.     Wights  Smoker:  no  Health Maintenance: Pap: 08/21/17 at [facility], WNL, negative HPV. 11/13/2016 WNL NEG HPV, 07-18-15 neg HPV HR neg, 2015 neg, cryo 2014 History of abnormal Pap:  yes MMG: 06/28/19 density C Bi-rads 1 neg   BMD:   n/a Colonoscopy: n/a TDaP:  03/29/21 Gardasil: none   reports that she has never smoked. She has never used smokeless tobacco. She reports that she does not currently use alcohol after a past usage of about 2.0 standard drinks of alcohol per week. She reports that she does not use drugs. 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   Past Medical History:  Diagnosis Date   Abnormal Pap smear of cervix 2014   Allergy    Arthritis    Depression    History of chicken pox     Past Surgical History:  Procedure Laterality Date   BREAST REDUCTION SURGERY Bilateral 02/2020   CRYOTHERAPY  2014   for abnormal pap smear   HIP SURGERY     right hip    Current Outpatient Medications  Medication Sig Dispense Refill   metroNIDAZOLE (FLAGYL) 500 MG tablet Take 500 mg by mouth 2 (two) times daily.     Multiple Vitamins-Minerals (WOMENS MULTIVITAMIN PLUS) TABS Take 1 tablet by mouth daily.     No current facility-administered medications for this visit.  On flagyl for BV.    Family History  Problem Relation Age of Onset   Mitral valve prolapse Father    Hypertension Mother    Colon polyps Mother    Lymphoma Mother    Breast cancer Maternal Aunt 47       deceased from breast cancer   Lung cancer Paternal Grandfather    Mitral valve prolapse Paternal Aunt    Breast cancer Paternal Aunt        over 26   Lung cancer Maternal Grandfather    Mitral valve prolapse Paternal Aunt    Breast cancer Paternal Aunt        over 55    Colon cancer Neg Hx     Review of Systems  Exam:   BP 128/70   Pulse 66   Ht '5\' 11"'$  (1.803 m)   Wt 213 lb (96.6 kg)   LMP 09/23/2022   SpO2 100%   BMI 29.71 kg/m   Weight change: '@WEIGHTCHANGE'$ @ Height:   Height: '5\' 11"'$  (180.3 cm)  Ht Readings from Last 3 Encounters:  10/07/22 '5\' 11"'$  (1.803 m)  05/01/22 '5\' 10"'$  (1.778 m)  07/05/21 '5\' 10"'$  (1.778 m)    General appearance: alert, cooperative and appears stated age Head: Normocephalic, without obvious abnormality, atraumatic Neck: no adenopathy, supple, symmetrical, trachea midline and thyroid normal  to inspection and palpation Lungs: clear to auscultation bilaterally Cardiovascular: regular rate and rhythm Breasts:  evidence of bilateral breast reduction, tender on palpation of her right lateral breast, no lumps noted. Abdomen: soft, non-tender; non distended,  no masses,  no organomegaly Extremities: extremities normal, atraumatic, no cyanosis or edema Skin: Skin color, texture, turgor normal. No rashes or lesions Lymph nodes: Cervical, supraclavicular, and axillary nodes normal. No abnormal inguinal nodes palpated Neurologic: Grossly normal   Pelvic: External genitalia:  no lesions              Urethra:  normal appearing urethra with no masses, tenderness or lesions              Bartholins and Skenes: normal                 Vagina: normal appearing vagina with normal color and discharge, no lesions              Cervix: no lesions               Bimanual Exam:   Uterus:  normal size, contour, position, consistency, mobility, non-tender and anteverted              Adnexa: no mass, fullness, tenderness               Rectovaginal: Confirms               Anus:  normal sphincter tone, no lesions  Gae Dry, CMA chaperoned for the exam.  1. Well woman exam Discussed breast self exam Discussed calcium and vit D intake No labs today  2. Screening for cervical cancer - Cytology - PAP  3. Breast pain Will set up diagnostic imaging on the right.  Information on breast tenderness was given

## 2022-10-07 NOTE — Patient Instructions (Signed)
Breast Tenderness Breast tenderness is a common problem for women of all ages, but may also occur in men. Breast tenderness has many possible causes, including hormone changes, infections, taking certain medicines, and caffeine intake. In women, the pain usually comes and goes with the menstrual cycle, but it can also be constant. Breast tenderness may range from mild discomfort to severe pain. You may have tests, such as a mammogram or an ultrasound, to check for any unusual findings. Having breast tenderness usually does not mean that you have breast cancer. Follow these instructions at home: Managing pain and discomfort  If directed, put ice on the painful area. To do this: Put ice in a plastic bag. Place a towel between your skin and the bag. Leave the ice on for 20 minutes, 2-3 times a day. If your skin turns bright red, remove the ice right away to prevent skin damage. The risk of skin damage is higher if you cannot feel pain, heat, or cold. Wear a supportive bra or chest support: During exercise. While sleeping, if your breasts are very tender. Medicines Take over-the-counter and prescription medicines only as told by your health care provider. If the cause of your pain is an infection, you may be prescribed an antibiotic medicine. If you were prescribed antibiotics, take them as told by your health care provider. Do not stop using the antibiotic even if you start to feel better. Eating and drinking Decrease the amount of caffeine in your diet. Instead, drink more water and choose caffeine-free drinks. Your health care provider may recommend that you lessen the amount of fat in your diet. You can do this by: Limiting fried foods. Cooking foods using methods such as baking, boiling, grilling, and broiling. General instructions  Keep a log of the days and times when your breasts are most tender. Ask your health care provider how to do breast exams at home. This will help you notice if  you have an unusual growth or lump. Keep all follow-up visits. Contact a health care provider if: Any part of your breast is hard, red, and hot to the touch. This may be a sign of infection. You are a woman and have a new or painful lump in your breast that remains after your menstrual period ends. You are not breastfeeding and you have fluid, especially blood or pus, coming out of your nipples. You have a fever. Your pain does not improve or it gets worse. Your pain is interfering with your daily activities. Summary Breast tenderness may range from mild discomfort to severe pain. Breast tenderness has many possible causes, including hormone changes, infections, taking certain medicines, and caffeine intake. It can be treated with ice, wearing a supportive bra or chest support, and medicines. Make changes to your diet as told by your health care provider. This information is not intended to replace advice given to you by your health care provider. Make sure you discuss any questions you have with your health care provider. Document Revised: 10/09/2021 Document Reviewed: 10/09/2021 Elsevier Patient Education  2023 Elsevier Inc.  

## 2022-10-08 ENCOUNTER — Ambulatory Visit
Admission: RE | Admit: 2022-10-08 | Discharge: 2022-10-08 | Disposition: A | Discharge status: Home / Self Care | Payer: Managed Care, Other (non HMO) | Source: Ambulatory Visit | Attending: Obstetrics and Gynecology | Admitting: Obstetrics and Gynecology

## 2022-10-08 ENCOUNTER — Ambulatory Visit
Admission: RE | Admit: 2022-10-08 | Discharge: 2022-10-08 | Disposition: A | Discharge status: Home / Self Care | Payer: Self-pay | Source: Ambulatory Visit | Attending: Obstetrics and Gynecology | Admitting: Obstetrics and Gynecology

## 2022-10-08 ENCOUNTER — Other Ambulatory Visit: Payer: Self-pay | Admitting: Obstetrics and Gynecology

## 2022-10-08 ENCOUNTER — Telehealth: Payer: Self-pay

## 2022-10-08 DIAGNOSIS — N644 Mastodynia: Secondary | ICD-10-CM

## 2022-10-08 NOTE — Telephone Encounter (Signed)
Spoke w/ pt and she confirmed that she will be able to make it to appt today. Will route to provider for final review and close encounter.

## 2022-10-08 NOTE — Telephone Encounter (Signed)
[facility] had an opening today @ 10/08/2022 @ 140 pm.   Called pt to notify and no answer, so left DVM on machine per DPR. Will attempt to cb in an hour to confirm that pt received msg.

## 2022-10-08 NOTE — Telephone Encounter (Signed)
Attempted to contact pt again, no answer, hung up call.

## 2022-10-08 NOTE — Telephone Encounter (Signed)
-----   Message from Salvadore Dom, MD sent at 10/07/2022  4:25 PM EST ----- Please set up diagnostic breast imaging for right lateral breast pain.

## 2022-10-10 LAB — CYTOLOGY - PAP
Comment: NEGATIVE
High risk HPV: NEGATIVE

## 2023-08-21 ENCOUNTER — Ambulatory Visit: Payer: Self-pay | Admitting: Internal Medicine

## 2023-08-21 NOTE — Telephone Encounter (Signed)
 Copied from CRM [id number]. Topic: Clinical - Red Word Triage >> Aug 21, 2023 10:52 AM Macario HERO wrote: Red Word that prompted transfer to Nurse Triage: Feels like she may be experiencing a kidney infection. Acute back pain, need to urinate a lot.   Chief Complaint: right flank pain Symptoms: urinary frequency and urgency Frequency: ongoing since 08/20/2023 Pertinent Negatives: Patient denies fever Disposition: [] ED /[x] Urgent Care (no appt availability in office) / [] Appointment(In office/virtual)/ []  [facility]/ [] Home Care/ [] Refused Recommended Disposition /[] [facility]/ []  Follow-up with PCP Additional Notes: The patient reported right flank pain that started 08/20/2023.  She is also having urinary frequency and urgency.  She denied pain with urination or fever. She was advised to go to urgent care for further assessment as there was no in office availability.  She was agreeable.    Reason for Disposition  Side (flank) or lower back pain present  Answer Assessment - Initial Assessment Questions 1. SYMPTOM: What's the main symptom you're concerned about? (e.g., frequency, incontinence)     Urinary frequency, urgency  2. ONSET: When did the  urgency/frequency  start?     08/20/2023 3. PAIN: Is there any pain? If Yes, ask: How bad is it? (Scale: 1-10; mild, moderate, severe)     4/10 constant dull right side lower back 4. CAUSE: What do you think is causing the symptoms?     Uti  5. OTHER SYMPTOMS: Do you have any other symptoms? (e.g., blood in urine, fever, flank pain, pain with urination) Right flank pain  Protocols used: Urinary Symptoms-A-AH

## 2023-09-15 ENCOUNTER — Other Ambulatory Visit: Payer: Self-pay | Admitting: Obstetrics and Gynecology

## 2023-09-15 DIAGNOSIS — N644 Mastodynia: Secondary | ICD-10-CM

## 2023-10-19 ENCOUNTER — Other Ambulatory Visit: Payer: Managed Care, Other (non HMO)

## 2023-10-26 ENCOUNTER — Ambulatory Visit
Admission: RE | Admit: 2023-10-26 | Discharge: 2023-10-26 | Disposition: A | Discharge status: Home / Self Care | Payer: Managed Care, Other (non HMO) | Source: Ambulatory Visit | Attending: Obstetrics and Gynecology

## 2023-10-26 ENCOUNTER — Ambulatory Visit
Admission: RE | Admit: 2023-10-26 | Discharge: 2023-10-26 | Disposition: A | Discharge status: Home / Self Care | Payer: Managed Care, Other (non HMO) | Source: Ambulatory Visit | Attending: Obstetrics and Gynecology | Admitting: Obstetrics and Gynecology

## 2023-10-26 DIAGNOSIS — N644 Mastodynia: Secondary | ICD-10-CM

## 2024-01-20 NOTE — Progress Notes (Signed)
 Ben Mariangel Ringley D.[facility] [address] [place] [address] Phone: [phone]   Assessment and Plan:    1. Chronic pain of left knee (Primary) -Chronic with exacerbation, initial visit - Most consistent with patellofemoral osteoarthritis leading to intra-articular effusion and posterior knee pressure, causing discomfort through distal hamstring and proximal gastrocnemius - Start meloxicam  15 mg daily x2 weeks.  If still having pain after 2 weeks, complete 3rd-week of NSAID. May use remaining NSAID as needed once daily for pain control.  Do not to use additional over-the-counter NSAIDs (ibuprofen , naproxen , Advil , Aleve , etc.) while taking prescription NSAIDs.  May use Tylenol  [phone] mg 2 to 3 times a day for breakthrough pain. - Start HEP for knee - Recommend patient sign records release form so we can get updated MRI and x-ray imaging of knee    Pertinent previous records reviewed include none  Follow Up: 4 weeks for reevaluation.  Could consider CSI versus prednisone  course versus physical therapy   Subjective:   I, Robyn Long, am serving as a Neurosurgeon for Doctor Ulysees Gander  Chief Complaint: left knee pain   HPI:   01/21/2024 Patient is a 38 year old female with left knee pain. Patient states the pain is behind the knee and cannot tell if its from the calf or the hamstring because the pain will come from each area depending on the day. Not aware of any MOI. Patient has started to get tingling in her foot. Patient states she knows that she already has arthritis in the knees thought it was a bakers cyst but does not feel anything. Patient does work out regularly and the pain started three weeks ago and has progressively started hurting daily and all the time. No redness or warmth. Started with NSAIDS but stopped does intermittently do ice and heat    Relevant Historical Information: None pertinent  Additional pertinent review of  systems negative.   Current Outpatient Medications:    meloxicam  (MOBIC ) 15 MG tablet, Take 1 tablet (15 mg total) by mouth daily., Disp: 30 tablet, Rfl: 0   Multiple Vitamins-Minerals (WOMENS MULTIVITAMIN PLUS) TABS, Take 1 tablet by mouth daily., Disp: , Rfl:    naltrexone (DEPADE) 50 MG tablet, Take 50 mg by mouth every morning., Disp: , Rfl:    sertraline (ZOLOFT) 50 MG tablet, Take 50 mg by mouth daily., Disp: , Rfl:    metroNIDAZOLE (FLAGYL) 500 MG tablet, Take 500 mg by mouth 2 (two) times daily., Disp: , Rfl:    Objective:     Vitals:   01/21/24 0932  BP: 122/82  Pulse: 87  SpO2: 98%  Height: 5' 11 (1.803 m)      Body mass index is 29.71 kg/m.    Physical Exam:    General:  awake, alert oriented, no acute distress nontoxic Skin: no suspicious lesions or rashes Neuro:sensation intact and strength 5/5 with no deficits, no atrophy, normal muscle tone Psych: No signs of anxiety, depression or other mood disorder  Left knee: Crepitus present Positive patellar grind No swelling No deformity Neg fluid wave, joint milking ROM Flex 110, Ext 0 TTP mildly distal hamstring and proximal gastrocnemius NTTP over the quad tendon, medial fem condyle, lat fem condyle, patella, plica, patella tendon, tibial tuberostiy, fibular head, posterior fossa, pes anserine bursa, gerdy's tubercle, medial jt line, lateral jt line Neg anterior and posterior drawer Neg lachman Neg sag sign Negative varus stress Negative valgus stress Negative McMurray Negative Thessaly  Gait  normal    Electronically signed by:  Marshall Skeeter D.[facility] 10:11 AM 01/21/24

## 2024-01-21 ENCOUNTER — Ambulatory Visit: Admitting: Sports Medicine

## 2024-01-21 VITALS — BP 122/82 | HR 87 | Ht 71.0 in

## 2024-01-21 DIAGNOSIS — M25562 Pain in left knee: Secondary | ICD-10-CM

## 2024-01-21 DIAGNOSIS — G8929 Other chronic pain: Secondary | ICD-10-CM

## 2024-01-21 MED ORDER — MELOXICAM 15 MG PO TABS: 15.0000 mg | ORAL_TABLET | Freq: Every day | ORAL | 0 refills | Status: AC

## 2024-01-21 NOTE — Patient Instructions (Addendum)
 Good to see you  - Start meloxicam  15 mg daily x2 weeks.  If still having pain after 2 weeks, complete 3rd-week of NSAID. May use remaining NSAID as needed once daily for pain control.  Do not to use additional over-the-counter NSAIDs (ibuprofen , naproxen , Advil , Aleve , etc.) while taking prescription NSAIDs.  May use Tylenol  [phone] mg 2 to 3 times a day for breakthrough pain.  Knee exercises given   Recommend getting records release for knee imaging Fill it out out front when you make your follow up  Follow up in 4 weeks

## 2024-02-17 ENCOUNTER — Other Ambulatory Visit: Payer: Self-pay | Admitting: Sports Medicine

## 2024-02-18 ENCOUNTER — Ambulatory Visit: Admitting: Sports Medicine

## 2024-02-24 ENCOUNTER — Telehealth: Payer: Self-pay | Admitting: Sports Medicine

## 2024-02-24 NOTE — Telephone Encounter (Signed)
 Mailed Imaging disc from [facility] to pt 02/24/2024 as pt cancelled 02/18/2024 appt with us .

## 2024-08-22 ENCOUNTER — Encounter: Payer: Self-pay | Admitting: *Deleted

## 2024-09-15 ENCOUNTER — Other Ambulatory Visit: Payer: Self-pay | Admitting: Obstetrics and Gynecology

## 2024-09-15 DIAGNOSIS — N644 Mastodynia: Secondary | ICD-10-CM

## 2024-10-07 ENCOUNTER — Encounter

## 2024-10-07 ENCOUNTER — Other Ambulatory Visit
